# Patient Record
Sex: Female | Born: 1995 | Race: Black or African American | Hispanic: No | Marital: Single | Smoking: Current some day smoker
Health system: Southern US, Community
[De-identification: ages and names within clinical notes are randomized; demographics above are authoritative.]

## PROBLEM LIST (undated history)

## (undated) ENCOUNTER — Inpatient Hospital Stay (HOSPITAL_COMMUNITY): Payer: Self-pay

## (undated) DIAGNOSIS — J40 Bronchitis, not specified as acute or chronic: Secondary | ICD-10-CM

## (undated) DIAGNOSIS — R519 Headache, unspecified: Secondary | ICD-10-CM

## (undated) DIAGNOSIS — R51 Headache: Secondary | ICD-10-CM

## (undated) HISTORY — PX: NO PAST SURGERIES: SHX2092

---
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
## 2014-04-09 ENCOUNTER — Encounter (HOSPITAL_COMMUNITY): Payer: Self-pay | Admitting: *Deleted

## 2014-04-09 ENCOUNTER — Inpatient Hospital Stay (HOSPITAL_COMMUNITY)
Admission: AD | Admit: 2014-04-09 | Discharge: 2014-04-09 | Disposition: A | Discharge status: Home / Self Care | Payer: No Typology Code available for payment source | Source: Ambulatory Visit | Attending: Obstetrics & Gynecology | Admitting: Obstetrics & Gynecology

## 2014-04-09 DIAGNOSIS — Z975 Presence of (intrauterine) contraceptive device: Secondary | ICD-10-CM

## 2014-04-09 DIAGNOSIS — Z3046 Encounter for surveillance of implantable subdermal contraceptive: Secondary | ICD-10-CM | POA: Insufficient documentation

## 2014-04-09 NOTE — MAU Provider Note (Signed)
  History     CSN: [id number]  Arrival date and time: 04/09/14 1540   None     No chief complaint on file.  HPI  Lori Short is  a 18 y.o. who presents today to have her Nexplanon removed. She had the Nexplanon replaced 3 years ago, and was due for removal in May. [facility], and they told her they would not remove it since she was over 17 now. She did not know where to. She denies any pain. She had a period last week, unsure of the exact date. She states that she took a pregnancy test at home, and it was negative. She has been having unprotected intercourse.   No past medical history on file.  No past surgical history on file.  No family history on file.  History  Substance Use Topics  . Smoking status: Never Smoker   . Smokeless tobacco: Not on file  . Alcohol Use: No    Allergies: No Known Allergies  Prescriptions prior to admission  Medication Sig Dispense Refill  . etonogestrel (IMPLANON) 68 MG IMPL implant Inject 68 mg into the skin once.        ROS Physical Exam   There were no vitals taken for this visit.  Physical Exam  Nursing note and vitals reviewed. Constitutional: [name], place, and time. [name]. No distress.  Cardiovascular: Normal rate.   Respiratory: Effort normal.  GI: Soft. There is no tenderness.  Neurological: [name], place, and time.  Skin: Skin is warm and dry.  Psychiatric: She has a normal mood and affect.    MAU Course  Procedures    Assessment and Plan   1. Nexplanon in place    Discussed calling the health department or the [facility] for removal Patient is agreeable, and will call for an appointment Discussed condoms for STD protection and contraceptive back-up  Tawnya CrookHogan, Ronan Antolin Donovan 04/09/2014, 4:05 PM

## 2014-04-09 NOTE — Discharge Instructions (Signed)
Contraceptive Implant Information A contraceptive implant is a plastic rod that is inserted under your skin. It is usually inserted under the skin of your upper arm. It continually releases small amounts of progestin (synthetic progesterone) into your bloodstream. This prevents an egg from being released from your ovaries. It also thickens your cervical mucus to prevent sperm from entering the cervix, and it thins your uterine lining to prevent a fertilized egg from attaching to your uterus. Contraceptive implants can be effective for up to 3 years. They do not provide protection against sexually transmitted diseases (STDs).  The procedure to insert an implant usually takes about 10 minutes. There may be minor bruising, swelling, and discomfort at the insertion site for a couple days. The implant begins to work within the first day. Other contraceptive protection may be necessary for 7 days. Be sure to discuss with your health care provider if you need a backup method of contraception.  Your health care provider will make sure you are a good candidate for the contraceptive implant. Discuss with your health care provider the possible side effects of the implant. ADVANTAGES  It prevents pregnancy for up to 3 years.  It is easily reversible.  It is convenient.  It can be used when breastfeeding.  It can be used by women who cannot take estrogen. DISADVANTAGES  You may have irregular or unplanned vaginal bleeding.  You may develop side effects, including headache, weight gain, acne, breast tenderness, or mood changes.  You may have tissue or nerve damage after insertion (rare).  It may be difficult and uncomfortable to remove.  Certain medicines may interfere with the effectiveness of the implant. REMOVAL OF IMPLANT The implant should be removed in 3 years or as directed by your health care provider. The implant's effect wears off in a few hours after removal. Your ability to get pregnant  (fertility) may be restored in 1-2 weeks. A new implant can be inserted as soon as the old one is removed if desired. CONTRAINDICATIONS You should not get the implant if you are experiencing any of the following situations:  You are pregnant.  You have a history of breast cancer, osteoporosis, blood clots, heart disease, diabetes, high blood pressure, liver disease, tumors, or stroke.   You have undiagnosed vaginal bleeding.  You have a sensitivity to any part of the implant. Document Released: 08/30/2011 Document Revised: 05/13/2013 Document Reviewed: 03/09/2013 [facility] Patient Information 2015 [place], [place]. This information is not intended to replace advice given to you by your health care provider. Make sure you discuss any questions you have with your health care provider.  Contraception Choices Contraception (birth control) is the use of any methods or devices to prevent pregnancy. Below are some methods to help avoid pregnancy. HORMONAL METHODS   Contraceptive implant. This is a thin, plastic tube containing progesterone hormone. It does not contain estrogen hormone. Your health care provider inserts the tube in the inner part of the upper arm. The tube can remain in place for up to 3 years. After 3 years, the implant must be removed. The implant prevents the ovaries from releasing an egg (ovulation), thickens the cervical mucus to prevent sperm from entering the uterus, and thins the lining of the inside of the uterus.  Progesterone-only injections. These injections are given every 3 months by your health care provider to prevent pregnancy. This synthetic progesterone hormone stops the ovaries from releasing eggs. It also thickens cervical mucus and changes the uterine lining. This makes it  harder for sperm to survive in the uterus.  Birth control pills. These pills contain estrogen and progesterone hormone. They work by preventing the ovaries from releasing eggs (ovulation). They  also cause the cervical mucus to thicken, preventing the sperm from entering the uterus. Birth control pills are prescribed by a health care provider.Birth control pills can also be used to treat heavy periods.  Minipill. This type of birth control pill contains only the progesterone hormone. They are taken every day of each month and must be prescribed by your health care provider.  Birth control patch. The patch contains hormones similar to those in birth control pills. It must be changed once a week and is prescribed by a health care provider.  Vaginal ring. The ring contains hormones similar to those in birth control pills. It is left in the vagina for 3 weeks, removed for 1 week, and then a new one is put back in place. The patient must be comfortable inserting and removing the ring from the vagina.A health care provider's prescription is necessary.  Emergency contraception. Emergency contraceptives prevent pregnancy after unprotected sexual intercourse. This pill can be taken right after sex or up to 5 days after unprotected sex. It is most effective the sooner you take the pills after having sexual intercourse. Most emergency contraceptive pills are available without a prescription. Check with your pharmacist. Do not use emergency contraception as your only form of birth control. BARRIER METHODS   Female condom. This is a thin sheath (latex or rubber) that is worn over the penis during sexual intercourse. It can be used with spermicide to increase effectiveness.  Female condom. This is a soft, loose-fitting sheath that is put into the vagina before sexual intercourse.  Diaphragm. This is a soft, latex, dome-shaped barrier that must be fitted by a health care provider. It is inserted into the vagina, along with a spermicidal jelly. It is inserted before intercourse. The diaphragm should be left in the vagina for 6 to 8 hours after intercourse.  Cervical cap. This is a round, soft, latex or  plastic cup that fits over the cervix and must be fitted by a health care provider. The cap can be left in place for up to 48 hours after intercourse.  Sponge. This is a soft, circular piece of polyurethane foam. The sponge has spermicide in it. It is inserted into the vagina after wetting it and before sexual intercourse.  Spermicides. These are chemicals that kill or block sperm from entering the cervix and uterus. They come in the form of creams, jellies, suppositories, foam, or tablets. They do not require a prescription. They are inserted into the vagina with an applicator before having sexual intercourse. The process must be repeated every time you have sexual intercourse. INTRAUTERINE CONTRACEPTION  Intrauterine device (IUD). This is a T-shaped device that is put in a woman's uterus during a menstrual period to prevent pregnancy. There are 2 types:  Copper IUD. This type of IUD is wrapped in copper wire and is placed inside the uterus. Copper makes the uterus and fallopian tubes produce a fluid that kills sperm. It can stay in place for 10 years.  Hormone IUD. This type of IUD contains the hormone progestin (synthetic progesterone). The hormone thickens the cervical mucus and prevents sperm from entering the uterus, and it also thins the uterine lining to prevent implantation of a fertilized egg. The hormone can weaken or kill the sperm that get into the uterus. It can stay in  place for 3-5 years, depending on which type of IUD is used. PERMANENT METHODS OF CONTRACEPTION  Female tubal ligation. This is when the woman's fallopian tubes are surgically sealed, tied, or blocked to prevent the egg from traveling to the uterus.  Hysteroscopic sterilization. This involves placing a small coil or insert into each fallopian tube. Your doctor uses a technique called hysteroscopy to do the procedure. The device causes scar tissue to form. This results in permanent blockage of the fallopian tubes, so the  sperm cannot fertilize the egg. It takes about 3 months after the procedure for the tubes to become blocked. You must use another form of birth control for these 3 months.  Female sterilization. This is when the female has the tubes that carry sperm tied off (vasectomy).This blocks sperm from entering the vagina during sexual intercourse. After the procedure, the man can still ejaculate fluid (semen). NATURAL PLANNING METHODS  Natural family planning. This is not having sexual intercourse or using a barrier method (condom, diaphragm, cervical cap) on days the woman could become pregnant.  Calendar method. This is keeping track of the length of each menstrual cycle and identifying when you are fertile.  Ovulation method. This is avoiding sexual intercourse during ovulation.  Symptothermal method. This is avoiding sexual intercourse during ovulation, using a thermometer and ovulation symptoms.  Post-ovulation method. This is timing sexual intercourse after you have ovulated. Regardless of which type or method of contraception you choose, it is important that you use condoms to protect against the transmission of sexually transmitted infections (STIs). Talk with your health care provider about which form of contraception is most appropriate for you. Document Released: 09/10/2005 Document Revised: 09/15/2013 Document Reviewed: 03/05/2013 [facility] Patient Information 2015 [place], [place]. This information is not intended to replace advice given to you by your health care provider. Make sure you discuss any questions you have with your health care provider.

## 2014-04-09 NOTE — MAU Note (Addendum)
Wants nexplanon removed, was to have been removed in May. [facility] Also wants a preg test; has been having unprotected sex; neg home test x2

## 2014-04-12 NOTE — MAU Provider Note (Signed)
Attestation of Attending Supervision of Advanced Practitioner: Evaluation and management procedures were performed by the PA/NP/CNM/OB Fellow under my supervision/collaboration. Chart reviewed and agree with management and plan.  Jomarie Gellis V 04/12/2014 6:28 AM

## 2014-05-11 ENCOUNTER — Encounter (HOSPITAL_COMMUNITY): Payer: Self-pay | Admitting: Advanced Practice Midwife

## 2014-05-11 ENCOUNTER — Inpatient Hospital Stay (HOSPITAL_COMMUNITY)
Admission: AD | Admit: 2014-05-11 | Discharge: 2014-05-11 | Disposition: A | Discharge status: Home / Self Care | Payer: No Typology Code available for payment source | Source: Ambulatory Visit | Attending: Obstetrics & Gynecology | Admitting: Obstetrics & Gynecology

## 2014-05-11 DIAGNOSIS — N644 Mastodynia: Secondary | ICD-10-CM

## 2014-05-11 DIAGNOSIS — F17200 Nicotine dependence, unspecified, uncomplicated: Secondary | ICD-10-CM | POA: Insufficient documentation

## 2014-05-11 DIAGNOSIS — Z3202 Encounter for pregnancy test, result negative: Secondary | ICD-10-CM | POA: Insufficient documentation

## 2014-05-11 LAB — URINALYSIS, ROUTINE W REFLEX MICROSCOPIC
Bilirubin Urine: NEGATIVE
Glucose, UA: NEGATIVE mg/dL
Ketones, ur: NEGATIVE mg/dL
LEUKOCYTES UA: NEGATIVE
Nitrite: NEGATIVE
Protein, ur: NEGATIVE mg/dL
SPECIFIC GRAVITY, URINE: 1.015 (ref 1.005–1.030)
UROBILINOGEN UA: 0.2 mg/dL (ref 0.0–1.0)
pH: 5.5 (ref 5.0–8.0)

## 2014-05-11 LAB — URINE MICROSCOPIC-ADD ON

## 2014-05-11 LAB — HCG, QUANTITATIVE, PREGNANCY

## 2014-05-11 LAB — POCT PREGNANCY, URINE: PREG TEST UR: NEGATIVE

## 2014-05-11 NOTE — MAU Note (Signed)
Patient states she has been having fullness in the left breast with soreness. Denies bleeding or discharge, Has nausea, no vomiting for a couple of weeks. Has had headaches off and on. Has had a positive and a negative home pregnancy test. Has an implant that expired in May.

## 2014-05-11 NOTE — Discharge Instructions (Signed)

## 2014-05-11 NOTE — MAU Provider Note (Signed)
History     CSN: [id number]  Arrival date and time: 05/11/14 1727   None     Chief Complaint  Patient presents with  . Breast Pain   HPI 18 y.o. G0P0 with "breast fullness" bilaterally and intermittent pains in [name] x a few weeks. States she has had some nausea and headaches, no vomiting. Has an Implanon in place that expired in May, states she had a negative and a positive pregnancy test at home.   Past Medical History  Diagnosis Date  . Medical history non-contributory     Past Surgical History  Procedure Laterality Date  . No past surgeries      Family History  Problem Relation Age of Onset  . Hypertension Maternal Grandmother   . Heart disease Maternal Grandmother     History  Substance Use Topics  . Smoking status: Current Some Day Smoker    Types: Cigarettes  . Smokeless tobacco: Never Used  . Alcohol Use: Yes     Comment: occ    Allergies: No Known Allergies  Prescriptions prior to admission  Medication Sig Dispense Refill  . etonogestrel (IMPLANON) 68 MG IMPL implant Inject 68 mg into the skin once.        Review of Systems  Constitutional: Negative.   Respiratory: Negative.   Cardiovascular: Negative.   Gastrointestinal: Negative for nausea, vomiting, abdominal pain, diarrhea and constipation.  Genitourinary: Negative for dysuria, urgency, frequency, hematuria and flank pain.       Negative for vaginal bleeding, vaginal discharge, dyspareunia  Musculoskeletal: Negative.   Neurological: Negative.   Psychiatric/Behavioral: Negative.    Physical Exam   Blood pressure 134/67, pulse 81, temperature 98.9 F (37.2 C), temperature source Oral, resp. rate 16, height 5' 3.5" (1.613 m), weight 188 lb 3.2 oz (85.367 kg), last menstrual period 05/04/2014, SpO2 100.00%.  Physical Exam  Nursing note and vitals reviewed. Constitutional: [name], place, and time. [name]. No distress.   Cardiovascular: Normal rate.   Respiratory: Effort normal. Right breast exhibits no inverted nipple, no mass, no nipple discharge, no skin change and no tenderness. Left breast exhibits no inverted nipple, no mass, no nipple discharge, no skin change and no tenderness. Breasts are symmetrical.  GI: Soft.  Musculoskeletal: Normal range of motion.  Lymphadenopathy:    [name].  Neurological: [name], place, and time.  Skin: Skin is warm and dry.  Psychiatric: She has a normal mood and affect.    MAU Course  Procedures  Results for orders placed during the hospital encounter of 05/11/14 (from the past 24 hour(s))  URINALYSIS, ROUTINE W REFLEX MICROSCOPIC     Status: Abnormal   Collection Time    05/11/14  6:00 PM      Result Value Ref Range   Color, Urine YELLOW  YELLOW   APPearance CLEAR  CLEAR   Specific Gravity, Urine 1.015  1.005 - 1.030   pH 5.5  5.0 - 8.0   Glucose, UA NEGATIVE  NEGATIVE mg/dL   Hgb urine dipstick LARGE (*) NEGATIVE   Bilirubin Urine NEGATIVE  NEGATIVE   Ketones, ur NEGATIVE  NEGATIVE mg/dL   Protein, ur NEGATIVE  NEGATIVE mg/dL   Urobilinogen, UA 0.2  0.0 - 1.0 mg/dL   Nitrite NEGATIVE  NEGATIVE   Leukocytes, UA NEGATIVE  NEGATIVE  URINE MICROSCOPIC-ADD ON     Status: Abnormal   Collection Time  05/11/14  6:00 PM      Result Value Ref Range   Squamous Epithelial / LPF FEW (*) RARE   WBC, UA 0-2  <3 WBC/hpf   RBC / HPF 3-6  <3 RBC/hpf   Bacteria, UA FEW (*) RARE   Urine-Other MUCOUS PRESENT    POCT PREGNANCY, URINE     Status: None   Collection Time    05/11/14  6:32 PM      Result Value Ref Range   Preg Test, Ur NEGATIVE  NEGATIVE  HCG, QUANTITATIVE, PREGNANCY     Status: None   Collection Time    05/11/14  7:35 PM      Result Value Ref Range   hCG, Beta Chain, Quant, S <1  <5 mIU/mL     Assessment and Plan   1. Breast tenderness in female   2. Negative pregnancy test   Normal breast  exam today w/ no tenderness or masses. F/U w/ worsening or new breast sx.  F/U outpatient for Implanon removal and discussion of new birth control methods.     Medication List         etonogestrel 68 MG Impl implant  Commonly known as:  IMPLANON  Inject 68 mg into the skin once.            Follow-up Information   Follow up with [facility] [phone])    Contact information:   [address] [place] [address]       Follow up with [facility] [place].   Contact information:   [address] [place] [address] [phone]        Lori Short 05/11/2014, 8:43 PM

## 2014-05-11 NOTE — MAU Provider Note (Signed)
Attestation of Attending Supervision of Advanced Practitioner (PA/CNM/NP): Evaluation and management procedures were performed by the Advanced Practitioner under my supervision and collaboration.  I have reviewed the Advanced Practitioner's note and chart, and I agree with the management and plan.  Louise Victory, MD, FACOG Attending Obstetrician & Gynecologist Faculty Practice, [facility]   

## 2014-07-15 ENCOUNTER — Encounter: Payer: Self-pay | Admitting: Obstetrics & Gynecology

## 2014-07-15 ENCOUNTER — Ambulatory Visit (INDEPENDENT_AMBULATORY_CARE_PROVIDER_SITE_OTHER): Payer: Medicaid Other | Admitting: Obstetrics & Gynecology

## 2014-07-15 VITALS — BP 110/66 | HR 78 | Resp 16 | Ht 65.0 in | Wt 198.0 lb

## 2014-07-15 DIAGNOSIS — Z3042 Encounter for surveillance of injectable contraceptive: Secondary | ICD-10-CM

## 2014-07-15 DIAGNOSIS — Z3049 Encounter for surveillance of other contraceptives: Secondary | ICD-10-CM

## 2014-07-15 NOTE — Progress Notes (Signed)
   Subjective:    Patient ID: Lori ClockShaniqua L Short, female    DOB: 10/02/1995, 18 y.o.   MRN: [id number]  HPI  18 yo S AA G0 is here today to have her expired Nexplanon removed. She plans to join [employer] after she loses 20#. She says that she is currently abstinent.  Review of Systems     Objective:   Physical Exam  Consent was signed and time out was done. Her right arm was prepped with betadine after establishing the position of the Nexplanon. The area was infiltrated with 2 cc of 1% lidocaine. A small incision was made and the intact rod was easily removed. A steristrip was placed and her arm was noted to be hemostatic. It was bandaged.  She tolerated the procedure well.      Assessment & Plan:  She declines Gardasil, Flu vaccines or other forms on contraception

## 2014-09-30 ENCOUNTER — Inpatient Hospital Stay (HOSPITAL_COMMUNITY)
Admission: AD | Admit: 2014-09-30 | Discharge: 2014-09-30 | Disposition: A | Discharge status: Home / Self Care | Payer: Medicaid Other | Source: Ambulatory Visit | Attending: Obstetrics & Gynecology | Admitting: Obstetrics & Gynecology

## 2014-09-30 ENCOUNTER — Encounter (HOSPITAL_COMMUNITY): Payer: Self-pay | Admitting: *Deleted

## 2014-09-30 DIAGNOSIS — N760 Acute vaginitis: Secondary | ICD-10-CM

## 2014-09-30 DIAGNOSIS — O99210 Obesity complicating pregnancy, unspecified trimester: Secondary | ICD-10-CM | POA: Diagnosis present

## 2014-09-30 DIAGNOSIS — F17210 Nicotine dependence, cigarettes, uncomplicated: Secondary | ICD-10-CM | POA: Diagnosis not present

## 2014-09-30 DIAGNOSIS — B9689 Other specified bacterial agents as the cause of diseases classified elsewhere: Secondary | ICD-10-CM | POA: Insufficient documentation

## 2014-09-30 DIAGNOSIS — Z3202 Encounter for pregnancy test, result negative: Secondary | ICD-10-CM | POA: Diagnosis not present

## 2014-09-30 DIAGNOSIS — F17200 Nicotine dependence, unspecified, uncomplicated: Secondary | ICD-10-CM | POA: Diagnosis present

## 2014-09-30 DIAGNOSIS — A499 Bacterial infection, unspecified: Secondary | ICD-10-CM

## 2014-09-30 DIAGNOSIS — Z113 Encounter for screening for infections with a predominantly sexual mode of transmission: Secondary | ICD-10-CM | POA: Diagnosis present

## 2014-09-30 LAB — WET PREP, GENITAL
Trich, Wet Prep: NONE SEEN
Yeast Wet Prep HPF POC: NONE SEEN

## 2014-09-30 LAB — URINALYSIS, ROUTINE W REFLEX MICROSCOPIC
Bilirubin Urine: NEGATIVE
Glucose, UA: NEGATIVE mg/dL
Ketones, ur: NEGATIVE mg/dL
Leukocytes, UA: NEGATIVE
Nitrite: NEGATIVE
PH: 6 (ref 5.0–8.0)
Protein, ur: NEGATIVE mg/dL
UROBILINOGEN UA: 0.2 mg/dL (ref 0.0–1.0)

## 2014-09-30 LAB — URINE MICROSCOPIC-ADD ON

## 2014-09-30 LAB — POCT PREGNANCY, URINE: Preg Test, Ur: NEGATIVE

## 2014-09-30 LAB — RPR

## 2014-09-30 MED ORDER — METRONIDAZOLE 500 MG PO TABS: 500.0000 mg | ORAL_TABLET | Freq: Two times a day (BID) | ORAL | Status: DC

## 2014-09-30 NOTE — Discharge Instructions (Signed)
Bacterial Vaginosis Bacterial vaginosis is a vaginal infection that occurs when the normal balance of bacteria in the vagina is disrupted. It results from an overgrowth of certain bacteria. This is the most common vaginal infection in women of childbearing age. Treatment is important to prevent complications, especially in pregnant women, as it can cause a premature delivery. CAUSES  Bacterial vaginosis is caused by an increase in harmful bacteria that are normally present in smaller amounts in the vagina. Several different kinds of bacteria can cause bacterial vaginosis. However, the reason that the condition develops is not fully understood. RISK FACTORS Certain activities or behaviors can put you at an increased risk of developing bacterial vaginosis, including:  Having a new sex partner or multiple sex partners.  Douching.  Using an intrauterine device (IUD) for contraception. Women do not get bacterial vaginosis from toilet seats, bedding, swimming pools, or contact with objects around them. SIGNS AND SYMPTOMS  Some women with bacterial vaginosis have no signs or symptoms. Common symptoms include:  Grey vaginal discharge.  A fishlike odor with discharge, especially after sexual intercourse.  Itching or burning of the vagina and vulva.  Burning or pain with urination. DIAGNOSIS  Your health care provider will take a medical history and examine the vagina for signs of bacterial vaginosis. A sample of vaginal fluid may be taken. Your health care provider will look at this sample under a microscope to check for bacteria and abnormal cells. A vaginal pH test may also be done.  TREATMENT  Bacterial vaginosis may be treated with antibiotic medicines. These may be given in the form of a pill or a vaginal cream. A second round of antibiotics may be prescribed if the condition comes back after treatment.  HOME CARE INSTRUCTIONS   Only take over-the-counter or prescription medicines as  directed by your health care provider.  If antibiotic medicine was prescribed, take it as directed. Make sure you finish it even if you start to feel better.  Do not have sex until treatment is completed.  Tell all sexual partners that you have a vaginal infection. They should see their health care provider and be treated if they have problems, such as a mild rash or itching.  Practice safe sex by using condoms and only having one sex partner. SEEK MEDICAL CARE IF:   Your symptoms are not improving after 3 days of treatment.  You have increased discharge or pain.  You have a fever. MAKE SURE YOU:   Understand these instructions.  Will watch your condition.  Will get help right away if you are not doing well or get worse. FOR MORE INFORMATION  Centers for Disease Control and Prevention, Division of STD Prevention: www.cdc.gov/std American Sexual Health Association (ASHA): www.ashastd.org  Document Released: 09/10/2005 Document Revised: 07/01/2013 Document Reviewed: 04/22/2013 ExitCare Patient Information 2015 [employer]. This information is not intended to replace advice given to you by your health care provider. Make sure you discuss any questions you have with your health care provider.  

## 2014-09-30 NOTE — MAU Provider Note (Signed)
History     CSN: [id number]  Arrival date and time: 09/30/14 0113   None     No chief complaint on file.  HPI Comments: Lori Short 19 y.o. G0P0 presents to MAU for complete STD screening. She has a vaginal odor and new partner. She has no birth control.      Past Medical History  Diagnosis Date  . Medical history non-contributory     Past Surgical History  Procedure Laterality Date  . No past surgeries      Family History  Problem Relation Age of Onset  . Hypertension Maternal Grandmother   . Heart disease Maternal Grandmother     History  Substance Use Topics  . Smoking status: Current Some Day Smoker -- 0.25 packs/day for 1 years    Types: Cigarettes  . Smokeless tobacco: Never Used  . Alcohol Use: Yes     Comment: occ    Allergies:  Allergies  Allergen Reactions  . Shellfish Allergy Hives and Nausea And Vomiting    Prescriptions prior to admission  Medication Sig Dispense Refill Last Dose  . etonogestrel (IMPLANON) 68 MG IMPL implant Inject 68 mg into the skin once.   june 2012    Review of Systems  Constitutional: Negative.   Eyes: Negative.   Cardiovascular: Negative.   Genitourinary:       Vaginal odor  Skin: Negative.   Neurological: Negative.   Psychiatric/Behavioral: Negative.    Physical Exam   Blood pressure 125/67, pulse 68, temperature 98 F (36.7 C), temperature source Oral, resp. rate 18, height 5\' 4"  (1.626 m), weight 92.534 kg (204 lb), last menstrual period 09/24/2014, SpO2 99 %.  Physical Exam  Constitutional: [name], place, and time. [name]. No distress.  HENT:  Head: Normocephalic and atraumatic.  GI: Soft. She exhibits no distension. There is no tenderness. There is no rebound.  Genitourinary:  Genital:External negative Vaginal:small amount pink discharge Cervix:closed/ thick Bimanual: nontender   Musculoskeletal: Normal range of motion.  Neurological:  [name], place, and time.  Skin: Skin is warm and dry.  Psychiatric: She has a normal mood and affect. Her behavior is normal. Judgment and thought content normal.   Results for orders placed or performed during the hospital encounter of 09/30/14 (from the past 24 hour(s))  Urinalysis, Routine w reflex microscopic     Status: Abnormal   Collection Time: 09/30/14  1:37 AM  Result Value Ref Range   Color, Urine YELLOW YELLOW   APPearance CLEAR CLEAR   Specific Gravity, Urine >1.030 (H) 1.005 - 1.030   pH 6.0 5.0 - 8.0   Glucose, UA NEGATIVE NEGATIVE mg/dL   Hgb urine dipstick SMALL (A) NEGATIVE   Bilirubin Urine NEGATIVE NEGATIVE   Ketones, ur NEGATIVE NEGATIVE mg/dL   Protein, ur NEGATIVE NEGATIVE mg/dL   Urobilinogen, UA 0.2 0.0 - 1.0 mg/dL   Nitrite NEGATIVE NEGATIVE   Leukocytes, UA NEGATIVE NEGATIVE  Urine microscopic-add on     Status: Abnormal   Collection Time: 09/30/14  1:37 AM  Result Value Ref Range   Squamous Epithelial / LPF FEW (A) RARE   WBC, UA 0-2 <3 WBC/hpf   RBC / HPF 3-6 <3 RBC/hpf   Bacteria, UA FEW (A) RARE  Pregnancy, urine POC     Status: None   Collection Time: 09/30/14  1:48 AM  Result Value Ref Range   Preg Test, Ur NEGATIVE NEGATIVE  Wet  prep, genital     Status: Abnormal   Collection Time: 09/30/14  3:03 AM  Result Value Ref Range   Yeast Wet Prep HPF POC NONE SEEN NONE SEEN   Trich, Wet Prep NONE SEEN NONE SEEN   Clue Cells Wet Prep HPF POC FEW (A) NONE SEEN   WBC, Wet Prep HPF POC FEW (A) NONE SEEN   .  MAU Course  Procedures  MDM Wet prep, GC, Chlamydia, RPR, HIV  Assessment and Plan   A: Bacterial Vaginosis  P: Flagyl 500 mg po BID x7 days Await remainder of cultures Advised to find GYN Return to MAU for emergencies   Carolynn Serve 09/30/2014, 4:04 AM

## 2014-09-30 NOTE — MAU Note (Signed)
Pt reports she basically wants to be checked for any type of vaginal infection including STD's

## 2014-10-01 ENCOUNTER — Telehealth: Payer: Self-pay

## 2014-10-01 DIAGNOSIS — A749 Chlamydial infection, unspecified: Secondary | ICD-10-CM

## 2014-10-01 LAB — GC/CHLAMYDIA PROBE AMP
CT Probe RNA: POSITIVE — AB
GC Probe RNA: NEGATIVE

## 2014-10-01 LAB — HIV ANTIBODY (ROUTINE TESTING W REFLEX)
HIV 1/HIV 2 AB: NONREACTIVE
HIV 1/O/2 Abs-Index Value: <1 (ref <1.00)

## 2014-10-01 MED ORDER — AZITHROMYCIN 500 MG PO TABS: 1000.0000 mg | ORAL_TABLET | Freq: Every day | ORAL | Status: DC

## 2014-10-01 NOTE — Telephone Encounter (Signed)
-----   Message from Lori BanterMarni W Smith sent at 10/01/2014  9:30 AM EST ----- Telephone call to patient regarding positive chlamydia culture, patient notified.  Patient has not been treated and will need Rx called in per protocol [facility] [address].  Instructed patient to notify her partner for treatment.

## 2014-10-01 NOTE — Telephone Encounter (Signed)
Zithromax 1gm e-prescribed per protocol to patient's requested pharmacy.

## 2014-10-16 ENCOUNTER — Encounter (HOSPITAL_COMMUNITY): Payer: Self-pay | Admitting: *Deleted

## 2014-10-16 ENCOUNTER — Inpatient Hospital Stay (HOSPITAL_COMMUNITY)
Admission: AD | Admit: 2014-10-16 | Discharge: 2014-10-16 | Disposition: A | Discharge status: Home / Self Care | Payer: Medicaid Other | Source: Ambulatory Visit | Attending: Obstetrics and Gynecology | Admitting: Obstetrics and Gynecology

## 2014-10-16 DIAGNOSIS — B9689 Other specified bacterial agents as the cause of diseases classified elsewhere: Secondary | ICD-10-CM | POA: Insufficient documentation

## 2014-10-16 DIAGNOSIS — B3731 Acute candidiasis of vulva and vagina: Secondary | ICD-10-CM

## 2014-10-16 DIAGNOSIS — N760 Acute vaginitis: Secondary | ICD-10-CM | POA: Diagnosis not present

## 2014-10-16 DIAGNOSIS — A5602 Chlamydial vulvovaginitis: Secondary | ICD-10-CM | POA: Diagnosis not present

## 2014-10-16 DIAGNOSIS — Z3202 Encounter for pregnancy test, result negative: Secondary | ICD-10-CM | POA: Diagnosis not present

## 2014-10-16 DIAGNOSIS — B373 Candidiasis of vulva and vagina: Secondary | ICD-10-CM

## 2014-10-16 LAB — WET PREP, GENITAL
Trich, Wet Prep: NONE SEEN
Yeast Wet Prep HPF POC: NONE SEEN

## 2014-10-16 LAB — URINALYSIS, ROUTINE W REFLEX MICROSCOPIC
Bilirubin Urine: NEGATIVE
Glucose, UA: NEGATIVE mg/dL
Hgb urine dipstick: NEGATIVE
Ketones, ur: NEGATIVE mg/dL
Nitrite: NEGATIVE
PH: 6 (ref 5.0–8.0)
Protein, ur: NEGATIVE mg/dL
Specific Gravity, Urine: 1.025 (ref 1.005–1.030)
Urobilinogen, UA: 0.2 mg/dL (ref 0.0–1.0)

## 2014-10-16 LAB — URINE MICROSCOPIC-ADD ON

## 2014-10-16 LAB — POCT PREGNANCY, URINE: Preg Test, Ur: NEGATIVE

## 2014-10-16 MED ORDER — FLUCONAZOLE 150 MG PO TABS: 150.0000 mg | ORAL_TABLET | Freq: Every day | ORAL | Status: DC

## 2014-10-16 MED ORDER — NYSTATIN-TRIAMCINOLONE 100000-0.1 UNIT/GM-% EX OINT: 1.0000 "application " | TOPICAL_OINTMENT | Freq: Two times a day (BID) | CUTANEOUS | Status: DC

## 2014-10-16 NOTE — Discharge Instructions (Signed)

## 2014-10-16 NOTE — MAU Note (Signed)
Pt was seen in MAU two weeks ago and given Zithromax for positive chlamydia test and was also treated for BV.  She states that she is very "itchy down there and it burns sometimes."  Pt requests a pregnancy test as well.  Also states she has been having some spotting.

## 2014-10-16 NOTE — MAU Provider Note (Signed)
Chief Complaint: Possible Pregnancy and reaction to medication    First Provider Initiated Contact with Patient 10/16/14 1730     SUBJECTIVE HPI: [name] is a 19 y.o. G0P0 who presents to maternity admissions reporting vaginal itching and burning x 2-3 days.  She reports she took azithromycin 1 g a week ago to treat positive chlamydia and completed a 1 week course of Flagyl for BV around that time.  She denies abdominal pain, vaginal bleeding, urinary symptoms, h/a, dizziness, n/v, or fever/chills.     Past Medical History  Diagnosis Date  . Medical history non-contributory    Past Surgical History  Procedure Laterality Date  . No past surgeries     History   Social History  . Marital Status: Single    Spouse Name: N/A    Number of Children: N/A  . Years of Education: N/A   Occupational History  . unemployed    Social History Main Topics  . Smoking status: Current Some Day Smoker -- 0.25 packs/day for 1 years    Types: Cigarettes  . Smokeless tobacco: Never Used  . Alcohol Use: Yes     Comment: occ  . Drug Use: Yes    Special: Marijuana     Comment: occ  . Sexual Activity:    Partners: Male    Birth Control/ Protection: None     Comment: implant was to be removed in May   Other Topics Concern  . Not on file   Social History Narrative   No current facility-administered medications on file prior to encounter.   No current outpatient prescriptions on file prior to encounter.   Allergies  Allergen Reactions  . Shellfish Allergy Hives and Nausea And Vomiting    ROS: Pertinent items in HPI  OBJECTIVE Blood pressure 134/87, pulse 100, resp. rate 18, last menstrual period 09/24/2014, SpO2 100 %. GENERAL: Well-developed, well-nourished female in no acute distress.  HEENT: Normocephalic HEART: normal rate RESP: normal effort ABDOMEN: Soft, non-tender EXTREMITIES: Nontender, no edema NEURO: Alert and oriented Pelvic exam: Cervix pink, visually closed,  without lesion, moderate amount thick white discharge clinging to vaginal walls, vaginal walls and external genitalia with mild erythema Bimanual exam: Cervix 0/long/high, firm, anterior, neg CMT, uterus nontender, nonenlarged, adnexa without tenderness, enlargement, or mass  LAB RESULTS  Results for orders placed or performed during the hospital encounter of 10/16/14 (from the past 168 hour(s))  Urine culture   Collection Time: 10/16/14  4:35 PM  Result Value Ref Range   Specimen Description URINE, CLEAN CATCH    Special Requests NONE    Colony Count      30,000 COLONIES/ML Performed at [facility]    Culture      Multiple bacterial morphotypes present, none predominant. Suggest appropriate recollection if clinically indicated. Performed at [facility]    Report Status 10/18/2014 FINAL   Urinalysis, Routine w reflex microscopic   Collection Time: 10/16/14  4:39 PM  Result Value Ref Range   Color, Urine YELLOW YELLOW   APPearance CLEAR CLEAR   Specific Gravity, Urine 1.025 1.005 - 1.030   pH 6.0 5.0 - 8.0   Glucose, UA NEGATIVE NEGATIVE mg/dL   Hgb urine dipstick NEGATIVE NEGATIVE   Bilirubin Urine NEGATIVE NEGATIVE   Ketones, ur NEGATIVE NEGATIVE mg/dL   Protein, ur NEGATIVE NEGATIVE mg/dL   Urobilinogen, UA 0.2 0.0 - 1.0 mg/dL   Nitrite NEGATIVE NEGATIVE   Leukocytes, UA SMALL (A) NEGATIVE  Urine microscopic-add on  Collection Time: 10/16/14  4:39 PM  Result Value Ref Range   Squamous Epithelial / LPF MANY (A) RARE   WBC, UA 3-6 <3 WBC/hpf   RBC / HPF 0-2 <3 RBC/hpf   Bacteria, UA RARE RARE   Urine-Other MUCOUS PRESENT   Wet prep, genital   Collection Time: 10/16/14  5:30 PM  Result Value Ref Range   Yeast Wet Prep HPF POC NONE SEEN NONE SEEN   Trich, Wet Prep NONE SEEN NONE SEEN   Clue Cells Wet Prep HPF POC FEW (A) NONE SEEN   WBC, Wet Prep HPF POC MODERATE (A) NONE SEEN  Pregnancy, urine POC   Collection Time: 10/16/14  5:34 PM  Result Value  Ref Range   Preg Test, Ur NEGATIVE NEGATIVE    IMAGING No results found.  ASSESSMENT 1. Vagina, candidiasis     PLAN Discharge home Diflucan 150 mg x 1 dose Mycolog ointment PRN GCC and urine culture pending   Follow-up Information    Follow up with [facility].   Specialty:  Obstetrics and Gynecology   Why:  Or with preferred Gyn provider for routine care/contraception   Contact information:   [address] [place] [address] [phone]      Follow up with [facility] [place] MATERNITY ADMISSIONS.   Why:  As needed for emergencies   Contact information:   [address] [id number] mc [place] [address] [phone]      Sharen Counter Certified Nurse-Midwife 10/18/2014  1:14 PM

## 2014-10-18 LAB — URINE CULTURE

## 2014-10-18 LAB — GC/CHLAMYDIA PROBE AMP (~~LOC~~) NOT AT ARMC
Chlamydia: NEGATIVE
NEISSERIA GONORRHEA: NEGATIVE

## 2015-04-07 ENCOUNTER — Emergency Department (HOSPITAL_COMMUNITY): Payer: Medicaid Other

## 2015-04-07 ENCOUNTER — Emergency Department (HOSPITAL_COMMUNITY)
Admission: EM | Admit: 2015-04-07 | Discharge: 2015-04-07 | Disposition: A | Discharge status: Home / Self Care | Payer: Medicaid Other | Attending: Emergency Medicine | Admitting: Emergency Medicine

## 2015-04-07 ENCOUNTER — Encounter (HOSPITAL_COMMUNITY): Payer: Self-pay | Admitting: *Deleted

## 2015-04-07 DIAGNOSIS — R0602 Shortness of breath: Secondary | ICD-10-CM

## 2015-04-07 DIAGNOSIS — Z79899 Other long term (current) drug therapy: Secondary | ICD-10-CM | POA: Insufficient documentation

## 2015-04-07 DIAGNOSIS — R110 Nausea: Secondary | ICD-10-CM | POA: Insufficient documentation

## 2015-04-07 DIAGNOSIS — J209 Acute bronchitis, unspecified: Secondary | ICD-10-CM | POA: Insufficient documentation

## 2015-04-07 DIAGNOSIS — Z720 Tobacco use: Secondary | ICD-10-CM | POA: Insufficient documentation

## 2015-04-07 DIAGNOSIS — J40 Bronchitis, not specified as acute or chronic: Secondary | ICD-10-CM

## 2015-04-07 LAB — D-DIMER, QUANTITATIVE (NOT AT ARMC): D-Dimer, Quant: <0.27 ug/mL-FEU (ref 0.00–0.48)

## 2015-04-07 LAB — CBC
HCT: 38.5 % (ref 36.0–46.0)
Hemoglobin: 11.6 g/dL — ABNORMAL LOW (ref 12.0–15.0)
MCH: 24.2 pg — ABNORMAL LOW (ref 26.0–34.0)
MCHC: 30.1 g/dL (ref 30.0–36.0)
MCV: 80.2 fL (ref 78.0–100.0)
Platelets: 424 10*3/uL — ABNORMAL HIGH (ref 150–400)
RBC: 4.8 MIL/uL (ref 3.87–5.11)
RDW: 14.8 % (ref 11.5–15.5)
WBC: 8.6 10*3/uL (ref 4.0–10.5)

## 2015-04-07 LAB — BASIC METABOLIC PANEL
ANION GAP: 7 (ref 5–15)
BUN: 10 mg/dL (ref 6–20)
CALCIUM: 8.9 mg/dL (ref 8.9–10.3)
CO2: 26 mmol/L (ref 22–32)
Chloride: 106 mmol/L (ref 101–111)
Creatinine, Ser: 0.81 mg/dL (ref 0.44–1.00)
GFR calc Af Amer: >60 mL/min (ref >60)
GFR calc non Af Amer: >60 mL/min (ref >60)
Glucose, Bld: 87 mg/dL (ref 65–99)
POTASSIUM: 3.9 mmol/L (ref 3.5–5.1)
Sodium: 139 mmol/L (ref 135–145)

## 2015-04-07 LAB — I-STAT TROPONIN, ED: TROPONIN I, POC: 0 ng/mL (ref 0.00–0.08)

## 2015-04-07 MED ORDER — ALBUTEROL SULFATE HFA 108 (90 BASE) MCG/ACT IN AERS
2.0000 | INHALATION_SPRAY | RESPIRATORY_TRACT | Status: DC | PRN
  Administered 2015-04-07: 2 via RESPIRATORY_TRACT
  Filled 2015-04-07: qty 6.7

## 2015-04-07 MED ORDER — AEROCHAMBER PLUS FLO-VU MEDIUM MISC
1.0000 | Freq: Once | Status: AC
  Administered 2015-04-07: 1
  Filled 2015-04-07: qty 1

## 2015-04-07 MED ORDER — PREDNISONE 50 MG PO TABS: 50.0000 mg | ORAL_TABLET | Freq: Every day | ORAL | Status: DC

## 2015-04-07 MED ORDER — GUAIFENESIN ER 1200 MG PO TB12: 1.0000 | ORAL_TABLET | Freq: Two times a day (BID) | ORAL | Status: DC

## 2015-04-07 NOTE — Progress Notes (Signed)
CM spoke with pt who [place].  CM discussed and provided written information for uninsured accepting pcps, discussed the importance of pcp vs EDP services for f/u care, www.needymeds.org, www.goodrx.com, discounted pharmacies and other [place] resources such as [facility] , [facility], affordable care act, financial assistance, uninsured dental services, [place] med assist, DSS and  health department  Reviewed resources for [place] uninsured accepting pcps like [facility], family medicine [facility] [address], [facility], [facility], local urgent care centers, [facility], [facility] family [facility], general medical clinics, family services of the [place], [facility] urgent care plus others, medication resources, [facility] out patient pharmacies and housing Pt voiced understanding and appreciation of resources provided   Provided [facility] contact information Pt agreed to a referral Cm completed referral Pt states she has not had an orange card previously  Pt to be contact by [facility] clinical liason

## 2015-04-07 NOTE — ED Notes (Signed)
Pt reports chest pain, SOB, and nausea since last night. Pain 4/10. Able to speak in full sentences. Denies cough.

## 2015-04-07 NOTE — Discharge Instructions (Signed)
Return here as needed.  Follow up with a primary care doctor °

## 2015-04-11 NOTE — ED Provider Notes (Signed)
CSN: [id number]     Arrival date & time 04/07/15  1332 History   First MD Initiated Contact with Patient 04/07/15 1512     Chief Complaint  Patient presents with  . Shortness of Breath  . Nausea     (Consider location/radiation/quality/duration/timing/severity/associated sxs/prior Treatment) HPI Patient presents to the emergency department with shortness of breath that started last night with some mild nausea.  The patient states that she has had some mild cough associated with this did not not significant coughing.  She states that she did have some nausea as well.  The patient states that she did not have any headache, blurred vision, fever, weakness, dizziness, headache, blurred vision, back pain, neck pain, or throat, runny nose or syncope.  Patient states she did not take any medications prior to arrival Past Medical History  Diagnosis Date  . Medical history non-contributory    Past Surgical History  Procedure Laterality Date  . No past surgeries     Family History  Problem Relation Age of Onset  . Hypertension Maternal Grandmother   . Heart disease Maternal Grandmother    History  Substance Use Topics  . Smoking status: Current Some Day Smoker -- 0.25 packs/day for 1 years    Types: Cigarettes  . Smokeless tobacco: Never Used  . Alcohol Use: Yes     Comment: occ   OB History    Gravida Para Term Preterm AB TAB SAB Ectopic Multiple Living   0              Review of Systems All other systems negative except as documented in the HPI. All pertinent positives and negatives as reviewed in the HPI.   Allergies  Shellfish allergy  Home Medications   Prior to Admission medications   Medication Sig Start Date End Date Taking? Authorizing Provider  fluconazole (DIFLUCAN) 150 MG tablet Take 1 tablet (150 mg total) by mouth daily. Patient not taking: Reported on 04/07/2015 10/16/14   Wilmer Floor Leftwich-Kirby, CNM  Guaifenesin 1200 MG TB12 Take 1 tablet (1,200 mg total) by  mouth 2 (two) times daily. 04/07/15   Charlestine Night, PA-C  nystatin-triamcinolone ointment (MYCOLOG) Apply 1 application topically 2 (two) times daily. Patient not taking: Reported on 04/07/2015 10/16/14   Wilmer Floor Leftwich-Kirby, CNM  predniSONE (DELTASONE) 50 MG tablet Take 1 tablet (50 mg total) by mouth daily. 04/07/15   Elinora Weigand, PA-C   BP 131/73 mmHg  Pulse 80  Temp(Src) 99.1 F (37.3 C) (Oral)  Resp 18  SpO2 100%  LMP 03/28/2015 (Exact Date) Physical Exam  Constitutional: [name], place, and time. [name].  HENT:  Head: Normocephalic and atraumatic.  Mouth/Throat: Oropharynx is clear and moist.  Eyes: Pupils are equal, round, and reactive to light.  Neck: Normal range of motion. Neck supple.  Cardiovascular: Normal rate, regular rhythm and normal heart sounds.  Exam reveals no gallop and no friction rub.   No murmur heard. Pulmonary/Chest: Effort normal and breath sounds normal. No respiratory distress. She has no wheezes.  Abdominal: Soft. She exhibits no distension. There is no tenderness.  Musculoskeletal: She exhibits no edema.  Neurological: [name], place, and time. She exhibits normal muscle tone. Coordination normal.  Skin: Skin is warm and dry. No rash noted. No erythema.  Psychiatric: She has a normal mood and affect. Her behavior is normal.  Nursing note and vitals reviewed.   ED Course  Procedures (  including critical care time) Labs Review Labs Reviewed  CBC - Abnormal; Notable for the following:    Hemoglobin 11.6 (*)    MCH 24.2 (*)    Platelets 424 (*)    All other components within normal limits  BASIC METABOLIC PANEL  D-DIMER, QUANTITATIVE (NOT AT [facility])  I-STAT TROPOININ, ED    Imaging Review No results found.   EKG Interpretation   Date/Time:  Thursday April 07 2015 13:38:59 EDT Ventricular Rate:  93 PR Interval:  183 QRS Duration: 75 QT Interval:   324 QTC Calculation: 403 R Axis:   56 Text Interpretation:  Sinus rhythm Baseline wander in lead(s) V5 V6 ED  PHYSICIAN INTERPRETATION AVAILABLE IN [facility] Confirmed by TEST,  Record ([id number]) on 04/08/2015 7:28:11 AM      Patient has no abnormalities on physical exam and her is sick.  She was normal.  The patient has normal vital signs show be treated for possible mild bronchitis.  Told to return here as needed    Charlestine NightChristopher Davis Vannatter, PA-C 04/11/15 0158  Charlestine Nighthristopher Sanjith Siwek, PA-C 04/11/15 0159  Eber HongBrian Miller, MD 04/12/15 [phone]

## 2015-04-28 ENCOUNTER — Inpatient Hospital Stay (HOSPITAL_COMMUNITY)
Admission: AD | Admit: 2015-04-28 | Discharge: 2015-04-28 | Disposition: A | Discharge status: Home / Self Care | Payer: Medicaid Other | Source: Ambulatory Visit | Attending: Family Medicine | Admitting: Family Medicine

## 2015-04-28 ENCOUNTER — Encounter (HOSPITAL_COMMUNITY): Payer: Self-pay | Admitting: *Deleted

## 2015-04-28 DIAGNOSIS — N760 Acute vaginitis: Secondary | ICD-10-CM | POA: Insufficient documentation

## 2015-04-28 DIAGNOSIS — Z202 Contact with and (suspected) exposure to infections with a predominantly sexual mode of transmission: Secondary | ICD-10-CM | POA: Insufficient documentation

## 2015-04-28 DIAGNOSIS — B9689 Other specified bacterial agents as the cause of diseases classified elsewhere: Secondary | ICD-10-CM | POA: Diagnosis not present

## 2015-04-28 DIAGNOSIS — Z87891 Personal history of nicotine dependence: Secondary | ICD-10-CM | POA: Insufficient documentation

## 2015-04-28 DIAGNOSIS — Z3200 Encounter for pregnancy test, result unknown: Secondary | ICD-10-CM | POA: Diagnosis present

## 2015-04-28 DIAGNOSIS — Z3202 Encounter for pregnancy test, result negative: Secondary | ICD-10-CM | POA: Diagnosis not present

## 2015-04-28 DIAGNOSIS — A499 Bacterial infection, unspecified: Secondary | ICD-10-CM

## 2015-04-28 HISTORY — DX: Headache: R51

## 2015-04-28 HISTORY — DX: Headache, unspecified: R51.9

## 2015-04-28 HISTORY — DX: Bronchitis, not specified as acute or chronic: J40

## 2015-04-28 LAB — URINALYSIS, ROUTINE W REFLEX MICROSCOPIC
Bilirubin Urine: NEGATIVE
GLUCOSE, UA: NEGATIVE mg/dL
Hgb urine dipstick: NEGATIVE
Ketones, ur: NEGATIVE mg/dL
Leukocytes, UA: NEGATIVE
Nitrite: NEGATIVE
PH: 6.5 (ref 5.0–8.0)
PROTEIN: NEGATIVE mg/dL
Specific Gravity, Urine: 1.02 (ref 1.005–1.030)
UROBILINOGEN UA: 0.2 mg/dL (ref 0.0–1.0)

## 2015-04-28 LAB — WET PREP, GENITAL
Trich, Wet Prep: NONE SEEN
YEAST WET PREP: NONE SEEN

## 2015-04-28 LAB — POCT PREGNANCY, URINE: PREG TEST UR: NEGATIVE

## 2015-04-28 MED ORDER — CEFTRIAXONE SODIUM 250 MG IJ SOLR
250.0000 mg | Freq: Once | INTRAMUSCULAR | Status: AC
  Administered 2015-04-28: 250 mg via INTRAMUSCULAR
  Filled 2015-04-28: qty 250

## 2015-04-28 MED ORDER — METRONIDAZOLE 500 MG PO TABS: 500.0000 mg | ORAL_TABLET | Freq: Two times a day (BID) | ORAL | Status: DC

## 2015-04-28 MED ORDER — AZITHROMYCIN 250 MG PO TABS
1000.0000 mg | ORAL_TABLET | Freq: Once | ORAL | Status: AC
  Administered 2015-04-28: 1000 mg via ORAL
  Filled 2015-04-28: qty 4

## 2015-04-28 NOTE — MAU Provider Note (Signed)
History     CSN: [id number]  Arrival date and time: 04/28/15 2104   First Provider Initiated Contact with Patient 04/28/15 2309      No chief complaint on file.  HPI Comments: Lori Short is a 19 y.o. G0P0 who presents today for a pregnancy test and STD. She states that she has had a vaginal discharge with an odor. She also reports unprotected intercourse with a partner who may have had chlamydia.   Vaginal Discharge The patient's primary symptoms include a genital odor and vaginal discharge. This is a new problem. The current episode started in the past 7 days. The problem occurs constantly. The problem has been unchanged. The patient is experiencing no pain. She is not pregnant. Pertinent negatives include no abdominal pain, constipation, diarrhea, dysuria, fever, frequency, nausea, urgency or vomiting. The vaginal discharge was malodorous and white. There has been no bleeding. Nothing aggravates the symptoms. She has tried nothing for the symptoms. The treatment provided no relief. She is sexually active. It is possible that her partner has an STD. She uses nothing for contraception.      Past Medical History  Diagnosis Date  . Bronchitis   . Headache     Past Surgical History  Procedure Laterality Date  . No past surgeries      Family History  Problem Relation Age of Onset  . Hypertension Maternal Grandmother   . Heart disease Maternal Grandmother     History  Substance Use Topics  . Smoking status: Former Smoker -- 0.25 packs/day for 1 years    Types: Cigarettes    Quit date: 04/14/2015  . Smokeless tobacco: Never Used  . Alcohol Use: Yes     Comment: occ    Allergies:  Allergies  Allergen Reactions  . Shellfish Allergy Hives and Nausea And Vomiting    Prescriptions prior to admission  Medication Sig Dispense Refill Last Dose  . fluconazole (DIFLUCAN) 150 MG tablet Take 1 tablet (150 mg total) by mouth daily. (Patient not taking: Reported on 04/07/2015) 1  tablet 0 Completed Course at Unknown time  . Guaifenesin 1200 MG TB12 Take 1 tablet (1,200 mg total) by mouth 2 (two) times daily. (Patient not taking: Reported on 04/28/2015) 20 each 0 Completed Course at Unknown time  . nystatin-triamcinolone ointment (MYCOLOG) Apply 1 application topically 2 (two) times daily. (Patient not taking: Reported on 04/07/2015) 30 g 0 Completed Course at Unknown time  . predniSONE (DELTASONE) 50 MG tablet Take 1 tablet (50 mg total) by mouth daily. (Patient not taking: Reported on 04/28/2015) 5 tablet 0 Not Taking at Unknown time    Review of Systems  Constitutional: Negative for fever.  Gastrointestinal: Negative for nausea, vomiting, abdominal pain, diarrhea and constipation.  Genitourinary: Positive for vaginal discharge. Negative for dysuria, urgency and frequency.   Physical Exam   Blood pressure 124/76, pulse 80, temperature 98.1 F (36.7 C), temperature source Oral, resp. rate 18, height 5\' 5"  (1.651 m), weight 94.348 kg (208 lb), last menstrual period 04/21/2015, SpO2 100 %.  Physical Exam  Nursing note and vitals reviewed. Constitutional: [name], place, and time. [name]. No distress.  Cardiovascular: Normal rate.   Respiratory: Effort normal.  GI: Soft. There is no tenderness. There is no rebound.  Neurological: [name], place, and time.  Skin: Skin is warm and dry.  Psychiatric: She has a normal mood and affect.     Results for  orders placed or performed during the hospital encounter of 04/28/15 (from the past 24 hour(s))  Urinalysis, Routine w reflex microscopic (not at [facility])     Status: None   Collection Time: 04/28/15  9:20 PM  Result Value Ref Range   Color, Urine YELLOW YELLOW   APPearance CLEAR CLEAR   Specific Gravity, Urine 1.020 1.005 - 1.030   pH 6.5 5.0 - 8.0   Glucose, UA NEGATIVE NEGATIVE mg/dL   Hgb urine dipstick NEGATIVE NEGATIVE   Bilirubin  Urine NEGATIVE NEGATIVE   Ketones, ur NEGATIVE NEGATIVE mg/dL   Protein, ur NEGATIVE NEGATIVE mg/dL   Urobilinogen, UA 0.2 0.0 - 1.0 mg/dL   Nitrite NEGATIVE NEGATIVE   Leukocytes, UA NEGATIVE NEGATIVE  Pregnancy, urine POC     Status: None   Collection Time: 04/28/15  9:29 PM  Result Value Ref Range   Preg Test, Ur NEGATIVE NEGATIVE  Wet prep, genital     Status: Abnormal   Collection Time: 04/28/15 10:35 PM  Result Value Ref Range   Yeast Wet Prep HPF POC NONE SEEN NONE SEEN   Trich, Wet Prep NONE SEEN NONE SEEN   Clue Cells Wet Prep HPF POC FEW (A) NONE SEEN   WBC, Wet Prep HPF POC FEW (A) NONE SEEN    MAU Course  Procedures  MDM Patient treated in MAU with rocephin and azithromycin.   Assessment and Plan   1. Bacterial vaginal infection   2. Exposure to STD    DC home Comfort measures reviewed  HIV pending  GC/CT pending, patient treated today  RX: flagyl #14  Return to MAU as needed   Follow-up Information    Please follow up.   Why:  Call in 2 days or log in to your mychart account for results       Follow up with [facility] DEPT [place].   Why:  As needed   Contact information:   [address] [facility] [address] [phone]        Tawnya Crook 04/28/2015, 11:19 PM

## 2015-04-28 NOTE — Discharge Instructions (Signed)
Levonorgestrel intrauterine device (IUD) What is this medicine? LEVONORGESTREL IUD (LEE voe nor jes trel) is a contraceptive (birth control) device. The device is placed inside the uterus by a healthcare professional. It is used to prevent pregnancy and can also be used to treat heavy bleeding that occurs during your period. Depending on the device, it can be used for 3 to 5 years. This medicine may be used for other purposes; ask your health care provider or pharmacist if you have questions. COMMON BRAND NAME(S): LILETTA, Mirena, Skyla What should I tell my health care provider before I take this medicine? They need to know if you have any of these conditions: -abnormal Pap smear -cancer of the breast, uterus, or cervix -diabetes -endometritis -genital or pelvic infection now or in the past -have more than one sexual partner or your partner has more than one partner -heart disease -history of an ectopic or tubal pregnancy -immune system problems -IUD in place -liver disease or tumor -problems with blood clots or take blood-thinners -use intravenous drugs -uterus of unusual shape -vaginal bleeding that has not been explained -an unusual or allergic reaction to levonorgestrel, other hormones, silicone, or polyethylene, medicines, foods, dyes, or preservatives -pregnant or trying to get pregnant -breast-feeding How should I use this medicine? This device is placed inside the uterus by a health care professional. Talk to your pediatrician regarding the use of this medicine in children. Special care may be needed. Overdosage: If you think you have taken too much of this medicine contact a poison control center or emergency room at once. NOTE: This medicine is only for you. Do not share this medicine with others. What if I miss a dose? This does not apply. What may interact with this medicine? Do not take this medicine with any of the following  medications: -amprenavir -bosentan -fosamprenavir This medicine may also interact with the following medications: -aprepitant -barbiturate medicines for inducing sleep or treating seizures -bexarotene -griseofulvin -medicines to treat seizures like carbamazepine, ethotoin, felbamate, oxcarbazepine, phenytoin, topiramate -modafinil -pioglitazone -rifabutin -rifampin -rifapentine -some medicines to treat HIV infection like atazanavir, indinavir, lopinavir, nelfinavir, tipranavir, ritonavir -St. John's wort -warfarin This list may not describe all possible interactions. Give your health care provider a list of all the medicines, herbs, non-prescription drugs, or dietary supplements you use. Also tell them if you smoke, drink alcohol, or use illegal drugs. Some items may interact with your medicine. What should I watch for while using this medicine? Visit your doctor or health care professional for regular check ups. See your doctor if you or your partner has sexual contact with others, becomes HIV positive, or gets a sexual transmitted disease. This product does not protect you against HIV infection (AIDS) or other sexually transmitted diseases. You can check the placement of the IUD yourself by reaching up to the top of your vagina with clean fingers to feel the threads. Do not pull on the threads. It is a good habit to check placement after each menstrual period. Call your doctor right away if you feel more of the IUD than just the threads or if you cannot feel the threads at all. The IUD may come out by itself. You may become pregnant if the device comes out. If you notice that the IUD has come out use a backup birth control method like condoms and call your health care provider. Using tampons will not change the position of the IUD and are okay to use during your period. What side effects may   I notice from receiving this medicine? Side effects that you should report to your doctor or  health care professional as soon as possible: -allergic reactions like skin rash, itching or hives, swelling of the face, lips, or tongue -fever, flu-like symptoms -genital sores -high blood pressure -no menstrual period for 6 weeks during use -pain, swelling, warmth in the leg -pelvic pain or tenderness -severe or sudden headache -signs of pregnancy -stomach cramping -sudden shortness of breath -trouble with balance, talking, or walking -unusual vaginal bleeding, discharge -yellowing of the eyes or skin Side effects that usually do not require medical attention (report to your doctor or health care professional if they continue or are bothersome): -acne -breast pain -change in sex drive or performance -changes in weight -cramping, dizziness, or faintness while the device is being inserted -headache -irregular menstrual bleeding within first 3 to 6 months of use -nausea This list may not describe all possible side effects. Call your doctor for medical advice about side effects. You may report side effects to FDA at 1-800-FDA-1088. Where should I keep my medicine? This does not apply. NOTE: This sheet is a summary. It may not cover all possible information. If you have questions about this medicine, talk to your doctor, pharmacist, or health care provider.  2015, Elsevier/Gold Standard. (2011-10-11 13:54:04) Contraception Choices Contraception (birth control) is the use of any methods or devices to prevent pregnancy. Below are some methods to help avoid pregnancy. HORMONAL METHODS   Contraceptive implant. This is a thin, plastic tube containing progesterone hormone. It does not contain estrogen hormone. Your health care provider inserts the tube in the inner part of the upper arm. The tube can remain in place for up to 3 years. After 3 years, the implant must be removed. The implant prevents the ovaries from releasing an egg (ovulation), thickens the cervical mucus to prevent sperm  from entering the uterus, and thins the lining of the inside of the uterus.  Progesterone-only injections. These injections are given every 3 months by your health care provider to prevent pregnancy. This synthetic progesterone hormone stops the ovaries from releasing eggs. It also thickens cervical mucus and changes the uterine lining. This makes it harder for sperm to survive in the uterus.  Birth control pills. These pills contain estrogen and progesterone hormone. They work by preventing the ovaries from releasing eggs (ovulation). They also cause the cervical mucus to thicken, preventing the sperm from entering the uterus. Birth control pills are prescribed by a health care provider.Birth control pills can also be used to treat heavy periods.  Minipill. This type of birth control pill contains only the progesterone hormone. They are taken every day of each month and must be prescribed by your health care provider.  Birth control patch. The patch contains hormones similar to those in birth control pills. It must be changed once a week and is prescribed by a health care provider.  Vaginal ring. The ring contains hormones similar to those in birth control pills. It is left in the vagina for 3 weeks, removed for 1 week, and then a new one is put back in place. The patient must be comfortable inserting and removing the ring from the vagina.A health care provider's prescription is necessary.  Emergency contraception. Emergency contraceptives prevent pregnancy after unprotected sexual intercourse. This pill can be taken right after sex or up to 5 days after unprotected sex. It is most effective the sooner you take the pills after having sexual intercourse. Most emergency contraceptive   pills are available without a prescription. Check with your pharmacist. Do not use emergency contraception as your only form of birth control. BARRIER METHODS   Female condom. This is a thin sheath (latex or rubber) that  is worn over the penis during sexual intercourse. It can be used with spermicide to increase effectiveness.  Female condom. This is a soft, loose-fitting sheath that is put into the vagina before sexual intercourse.  Diaphragm. This is a soft, latex, dome-shaped barrier that must be fitted by a health care provider. It is inserted into the vagina, along with a spermicidal jelly. It is inserted before intercourse. The diaphragm should be left in the vagina for 6 to 8 hours after intercourse.  Cervical cap. This is a round, soft, latex or plastic cup that fits over the cervix and must be fitted by a health care provider. The cap can be left in place for up to 48 hours after intercourse.  Sponge. This is a soft, circular piece of polyurethane foam. The sponge has spermicide in it. It is inserted into the vagina after wetting it and before sexual intercourse.  Spermicides. These are chemicals that kill or block sperm from entering the cervix and uterus. They come in the form of creams, jellies, suppositories, foam, or tablets. They do not require a prescription. They are inserted into the vagina with an applicator before having sexual intercourse. The process must be repeated every time you have sexual intercourse. INTRAUTERINE CONTRACEPTION  Intrauterine device (IUD). This is a T-shaped device that is put in a woman's uterus during a menstrual period to prevent pregnancy. There are 2 types:  Copper IUD. This type of IUD is wrapped in copper wire and is placed inside the uterus. Copper makes the uterus and fallopian tubes produce a fluid that kills sperm. It can stay in place for 10 years.  Hormone IUD. This type of IUD contains the hormone progestin (synthetic progesterone). The hormone thickens the cervical mucus and prevents sperm from entering the uterus, and it also thins the uterine lining to prevent implantation of a fertilized egg. The hormone can weaken or kill the sperm that get into the  uterus. It can stay in place for 3-5 years, depending on which type of IUD is used. PERMANENT METHODS OF CONTRACEPTION  Female tubal ligation. This is when the woman's fallopian tubes are surgically sealed, tied, or blocked to prevent the egg from traveling to the uterus.  Hysteroscopic sterilization. This involves placing a small coil or insert into each fallopian tube. Your doctor uses a technique called hysteroscopy to do the procedure. The device causes scar tissue to form. This results in permanent blockage of the fallopian tubes, so the sperm cannot fertilize the egg. It takes about 3 months after the procedure for the tubes to become blocked. You must use another form of birth control for these 3 months.  Female sterilization. This is when the female has the tubes that carry sperm tied off (vasectomy).This blocks sperm from entering the vagina during sexual intercourse. After the procedure, the man can still ejaculate fluid (semen). NATURAL PLANNING METHODS  Natural family planning. This is not having sexual intercourse or using a barrier method (condom, diaphragm, cervical cap) on days the woman could become pregnant.  Calendar method. This is keeping track of the length of each menstrual cycle and identifying when you are fertile.  Ovulation method. This is avoiding sexual intercourse during ovulation.  Symptothermal method. This is avoiding sexual intercourse during ovulation, using a   thermometer and ovulation symptoms.  Post-ovulation method. This is timing sexual intercourse after you have ovulated. Regardless of which type or method of contraception you choose, it is important that you use condoms to protect against the transmission of sexually transmitted infections (STIs). Talk with your health care provider about which form of contraception is most appropriate for you. Document Released: 09/10/2005 Document Revised: 09/15/2013 Document Reviewed: 03/05/2013 ExitCare Patient  Information 2015 [employer]. This information is not intended to replace advice given to you by your health care provider. Make sure you discuss any questions you have with your health care provider.  

## 2015-04-28 NOTE — MAU Note (Signed)
Pt states she had unprotected sex 3 weeks ago and the person told her that he was on the last day of treatment for chlamydia. Also reports she has had some nausea, LMP 07/28. Wants a preg test and STD testing.

## 2015-04-29 LAB — HIV ANTIBODY (ROUTINE TESTING W REFLEX): HIV SCREEN 4TH GENERATION: NONREACTIVE

## 2015-04-29 LAB — GC/CHLAMYDIA PROBE AMP (~~LOC~~) NOT AT ARMC
Chlamydia: NEGATIVE
Neisseria Gonorrhea: NEGATIVE

## 2015-10-23 ENCOUNTER — Inpatient Hospital Stay (HOSPITAL_COMMUNITY)
Admission: AD | Admit: 2015-10-23 | Discharge: 2015-10-24 | Disposition: A | Discharge status: Home / Self Care | Payer: Medicaid Other | Source: Ambulatory Visit | Attending: Obstetrics & Gynecology | Admitting: Obstetrics & Gynecology

## 2015-10-23 DIAGNOSIS — B9689 Other specified bacterial agents as the cause of diseases classified elsewhere: Secondary | ICD-10-CM

## 2015-10-23 DIAGNOSIS — Z87891 Personal history of nicotine dependence: Secondary | ICD-10-CM | POA: Insufficient documentation

## 2015-10-23 DIAGNOSIS — N760 Acute vaginitis: Secondary | ICD-10-CM | POA: Diagnosis not present

## 2015-10-23 DIAGNOSIS — R030 Elevated blood-pressure reading, without diagnosis of hypertension: Secondary | ICD-10-CM | POA: Diagnosis not present

## 2015-10-23 DIAGNOSIS — R102 Pelvic and perineal pain: Secondary | ICD-10-CM | POA: Diagnosis not present

## 2015-10-23 DIAGNOSIS — D649 Anemia, unspecified: Secondary | ICD-10-CM | POA: Diagnosis not present

## 2015-10-23 DIAGNOSIS — R1031 Right lower quadrant pain: Secondary | ICD-10-CM | POA: Diagnosis present

## 2015-10-23 NOTE — MAU Note (Signed)
Pt c/o RLQ pain that started earlier in the week. Describes it as pressure and rates 4/10. States that boyfriends grandmother gave her 1 tramadol tonight. "Did not help with pain but helped with her dizziness." Denies vag bleeding or discharge. Feeling lightheaded and dizzy. LMP: 10/16/2015.

## 2015-10-24 ENCOUNTER — Encounter (HOSPITAL_COMMUNITY): Payer: Self-pay

## 2015-10-24 LAB — URINALYSIS, ROUTINE W REFLEX MICROSCOPIC
Bilirubin Urine: NEGATIVE
Glucose, UA: NEGATIVE mg/dL
Ketones, ur: NEGATIVE mg/dL
LEUKOCYTES UA: NEGATIVE
Nitrite: NEGATIVE
Protein, ur: NEGATIVE mg/dL
SPECIFIC GRAVITY, URINE: 1.02 (ref 1.005–1.030)
pH: 5.5 (ref 5.0–8.0)

## 2015-10-24 LAB — URINE MICROSCOPIC-ADD ON: RBC / HPF: NONE SEEN RBC/hpf (ref 0–5)

## 2015-10-24 LAB — COMPREHENSIVE METABOLIC PANEL
ALT: 15 U/L (ref 14–54)
ANION GAP: 6 (ref 5–15)
AST: 19 U/L (ref 15–41)
Albumin: 4 g/dL (ref 3.5–5.0)
Alkaline Phosphatase: 61 U/L (ref 38–126)
BUN: 12 mg/dL (ref 6–20)
CO2: 28 mmol/L (ref 22–32)
CREATININE: 0.79 mg/dL (ref 0.44–1.00)
Calcium: 9.4 mg/dL (ref 8.9–10.3)
Chloride: 104 mmol/L (ref 101–111)
Glucose, Bld: 95 mg/dL (ref 65–99)
Potassium: 4.3 mmol/L (ref 3.5–5.1)
Sodium: 138 mmol/L (ref 135–145)
Total Bilirubin: 0.3 mg/dL (ref 0.3–1.2)
Total Protein: 7.8 g/dL (ref 6.5–8.1)

## 2015-10-24 LAB — CBC
HCT: 35.1 % — ABNORMAL LOW (ref 36.0–46.0)
Hemoglobin: 10.9 g/dL — ABNORMAL LOW (ref 12.0–15.0)
MCH: 24 pg — ABNORMAL LOW (ref 26.0–34.0)
MCHC: 31.1 g/dL (ref 30.0–36.0)
MCV: 77.1 fL — AB (ref 78.0–100.0)
Platelets: 377 10*3/uL (ref 150–400)
RBC: 4.55 MIL/uL (ref 3.87–5.11)
RDW: 15 % (ref 11.5–15.5)
WBC: 8.2 10*3/uL (ref 4.0–10.5)

## 2015-10-24 LAB — WET PREP, GENITAL
Sperm: NONE SEEN
TRICH WET PREP: NONE SEEN
YEAST WET PREP: NONE SEEN

## 2015-10-24 LAB — GC/CHLAMYDIA PROBE AMP (~~LOC~~) NOT AT ARMC
Chlamydia: NEGATIVE
Neisseria Gonorrhea: NEGATIVE

## 2015-10-24 LAB — POCT PREGNANCY, URINE: PREG TEST UR: NEGATIVE

## 2015-10-24 MED ORDER — IBUPROFEN 600 MG PO TABS: 600.0000 mg | ORAL_TABLET | Freq: Four times a day (QID) | ORAL | Status: DC | PRN

## 2015-10-24 MED ORDER — FERROUS SULFATE 325 (65 FE) MG PO TABS: 325.0000 mg | ORAL_TABLET | Freq: Two times a day (BID) | ORAL | Status: DC

## 2015-10-24 MED ORDER — METRONIDAZOLE 500 MG PO TABS: 500.0000 mg | ORAL_TABLET | Freq: Two times a day (BID) | ORAL | Status: DC

## 2015-10-24 NOTE — MAU Provider Note (Signed)
History   [id number]   Chief Complaint  Patient presents with  . Abdominal Pain    HPI Lori Short is a 20 y.o. female  G0P0 here with  Pt reports RLQ pain that started earlier in the week. Describes it as pressure and rates 4/10. States that boyfriends grandmother gave her 1 tramadol tonight. "Did not help with pain but helped with her dizziness."   Denies vag bleeding or discharge. After completing her menses patient reports feeling lightheaded and dizzy.  No hx of LOC.    Patient's last menstrual period was 10/16/2015.  OB History  Gravida Para Term Preterm AB SAB TAB Ectopic Multiple Living  0                 Past Medical History  Diagnosis Date  . Bronchitis   . Headache     Family History  Problem Relation Age of Onset  . Hypertension Maternal Grandmother   . Heart disease Maternal Grandmother     Social History   Social History  . Marital Status: Single    Spouse Name: N/A  . Number of Children: N/A  . Years of Education: N/A   Occupational History  . unemployed    Social History Main Topics  . Smoking status: Former Smoker -- 0.25 packs/day for 1 years    Types: Cigarettes    Quit date: 04/14/2015  . Smokeless tobacco: Never Used  . Alcohol Use: Yes     Comment: occ  . Drug Use: No     Comment: occ, last used May 2016  . Sexual Activity:    Partners: Male    Birth Control/ Protection: None     Comment: implant was to be removed in May   Other Topics Concern  . None   Social History Narrative    Allergies  Allergen Reactions  . Shellfish Allergy Hives and Nausea And Vomiting    No current facility-administered medications on file prior to encounter.   Current Outpatient Prescriptions on File Prior to Encounter  Medication Sig Dispense Refill  . metroNIDAZOLE (FLAGYL) 500 MG tablet Take 1 tablet (500 mg total) by mouth 2 (two) times daily. 14 tablet 0     Review of Systems  Constitutional: Negative for fever.  Gastrointestinal:  Positive for nausea. Negative for vomiting.  Genitourinary: Positive for vaginal bleeding, vaginal discharge and pelvic pain. Negative for dysuria and frequency.  Neurological: Positive for dizziness and light-headedness.     Physical Exam   Filed Vitals:   10/23/15 2338  BP: 155/87  Pulse: 80  Temp: 98.1 F (36.7 C)  Resp: 18  Height: 5' 4.5" (1.638 m)  Weight: 97.251 kg (214 lb 6.4 oz)  SpO2: 98%    Physical Exam  Constitutional: [name], place, and time. [name]. No distress.  HENT:  Head: Normocephalic.  Neck: Normal range of motion. Neck supple.  Cardiovascular: Normal rate, regular rhythm and normal heart sounds.   Respiratory: Effort normal and breath sounds normal. No respiratory distress.  GI: Soft. She exhibits no mass. There is no tenderness. There is no rebound, no guarding and no CVA tenderness.  Genitourinary: Cervix exhibits no motion tenderness and no discharge. Vaginal discharge (white, creamy) found.  Musculoskeletal: Normal range of motion. She exhibits no edema.  Neurological: [name], place, and time.  Skin: Skin is warm and dry.  Psychiatric: She has a normal mood and affect.  MAU Course  Procedures  MDM Results for orders placed or performed during the hospital encounter of 10/23/15 (from the past 24 hour(s))  Urinalysis, Routine w reflex microscopic (not at [facility])     Status: Abnormal   Collection Time: 10/23/15 11:40 PM  Result Value Ref Range   Color, Urine YELLOW YELLOW   APPearance CLEAR CLEAR   Specific Gravity, Urine 1.020 1.005 - 1.030   pH 5.5 5.0 - 8.0   Glucose, UA NEGATIVE NEGATIVE mg/dL   Hgb urine dipstick TRACE (A) NEGATIVE   Bilirubin Urine NEGATIVE NEGATIVE   Ketones, ur NEGATIVE NEGATIVE mg/dL   Protein, ur NEGATIVE NEGATIVE mg/dL   Nitrite NEGATIVE NEGATIVE   Leukocytes, UA NEGATIVE NEGATIVE  Urine microscopic-add on     Status: Abnormal    Collection Time: 10/23/15 11:40 PM  Result Value Ref Range   Squamous Epithelial / LPF 0-5 (A) NONE SEEN   WBC, UA 0-5 0 - 5 WBC/hpf   RBC / HPF NONE SEEN 0 - 5 RBC/hpf   Bacteria, UA RARE (A) NONE SEEN  Pregnancy, urine POC     Status: None   Collection Time: 10/24/15 12:39 AM  Result Value Ref Range   Preg Test, Ur NEGATIVE NEGATIVE  CBC     Status: Abnormal   Collection Time: 10/24/15  1:41 AM  Result Value Ref Range   WBC 8.2 4.0 - 10.5 K/uL   RBC 4.55 3.87 - 5.11 MIL/uL   Hemoglobin 10.9 (L) 12.0 - 15.0 g/dL   HCT 16.1 (L) 09.6 - 04.5 %   MCV 77.1 (L) 78.0 - 100.0 fL   MCH 24.0 (L) 26.0 - 34.0 pg   MCHC 31.1 30.0 - 36.0 g/dL   RDW 40.9 81.1 - 91.4 %   Platelets 377 150 - 400 K/uL  Comprehensive metabolic panel     Status: None   Collection Time: 10/24/15  1:41 AM  Result Value Ref Range   Sodium 138 135 - 145 mmol/L   Potassium 4.3 3.5 - 5.1 mmol/L   Chloride 104 101 - 111 mmol/L   CO2 28 22 - 32 mmol/L   Glucose, Bld 95 65 - 99 mg/dL   BUN 12 6 - 20 mg/dL   Creatinine, Ser 7.82 0.44 - 1.00 mg/dL   Calcium 9.4 8.9 - 95.6 mg/dL   Total Protein 7.8 6.5 - 8.1 g/dL   Albumin 4.0 3.5 - 5.0 g/dL   AST 19 15 - 41 U/L   ALT 15 14 - 54 U/L   Alkaline Phosphatase 61 38 - 126 U/L   Total Bilirubin 0.3 0.3 - 1.2 mg/dL   GFR calc non Af Amer >60 >60 mL/min   GFR calc Af Amer >60 >60 mL/min   Anion gap 6 5 - 15   Microscopic wet-mount exam shows clue cells.   Assessment and Plan  Pelvic Pain Anemia Bacterial Vaginosis Elevated Blood Pressure  Plan: Discharge home RX Ibuprofen 600 mg q 6-8 hours RX Flagyl RX Ferrous Sulfate Recheck blood pressure in one week > given list of PCP resources Outpatient pelvic ultrasound   Walidah N Karim, CNM 10/24/2015 2:44 AM

## 2016-01-18 ENCOUNTER — Ambulatory Visit (HOSPITAL_COMMUNITY)
Admission: RE | Admit: 2016-01-18 | Discharge: 2016-01-18 | Disposition: A | Discharge status: Home / Self Care | Payer: Medicaid Other | Source: Ambulatory Visit | Attending: Family | Admitting: Family

## 2016-01-18 DIAGNOSIS — R938 Abnormal findings on diagnostic imaging of other specified body structures: Secondary | ICD-10-CM | POA: Insufficient documentation

## 2016-01-18 DIAGNOSIS — R102 Pelvic and perineal pain: Secondary | ICD-10-CM

## 2016-01-24 ENCOUNTER — Telehealth: Payer: Self-pay | Admitting: General Practice

## 2016-01-24 ENCOUNTER — Encounter: Payer: Self-pay | Admitting: General Practice

## 2016-01-24 NOTE — Telephone Encounter (Signed)
Telephone call to patient regarding ultrasound results. No answer- left message stating we are trying to reach you with non urgent results, you may call us back at the clinics. Will send letter

## 2016-02-08 ENCOUNTER — Emergency Department (HOSPITAL_COMMUNITY)
Admission: EM | Admit: 2016-02-08 | Discharge: 2016-02-08 | Disposition: A | Discharge status: Home / Self Care | Payer: Medicaid Other | Attending: Emergency Medicine | Admitting: Emergency Medicine

## 2016-02-08 ENCOUNTER — Encounter (HOSPITAL_COMMUNITY): Payer: Self-pay

## 2016-02-08 DIAGNOSIS — Z87891 Personal history of nicotine dependence: Secondary | ICD-10-CM | POA: Insufficient documentation

## 2016-02-08 DIAGNOSIS — Z791 Long term (current) use of non-steroidal anti-inflammatories (NSAID): Secondary | ICD-10-CM | POA: Insufficient documentation

## 2016-02-08 DIAGNOSIS — N62 Hypertrophy of breast: Secondary | ICD-10-CM | POA: Insufficient documentation

## 2016-02-08 DIAGNOSIS — Z792 Long term (current) use of antibiotics: Secondary | ICD-10-CM | POA: Insufficient documentation

## 2016-02-08 DIAGNOSIS — IMO0002 Reserved for concepts with insufficient information to code with codable children: Secondary | ICD-10-CM

## 2016-02-08 DIAGNOSIS — Z79899 Other long term (current) drug therapy: Secondary | ICD-10-CM | POA: Insufficient documentation

## 2016-02-08 LAB — PREGNANCY, URINE: Preg Test, Ur: NEGATIVE

## 2016-02-08 LAB — I-STAT BETA HCG BLOOD, ED (MC, WL, AP ONLY): I-stat hCG, quantitative: <5 m[IU]/mL (ref <5)

## 2016-02-08 MED ORDER — IBUPROFEN 800 MG PO TABS: 800.0000 mg | ORAL_TABLET | Freq: Three times a day (TID) | ORAL | Status: DC | PRN

## 2016-02-08 NOTE — Discharge Instructions (Signed)
Your tests were normal here today. You will need to see the clinic provided for further evaluation.

## 2016-02-08 NOTE — ED Notes (Signed)
She states that in the past month both of her breasts have become significantly larger (both equally) with some bilat. Breast tenderness.  She states she had a normal period about a week ago.  She denies fever, nor any other sign of current illness.

## 2016-02-08 NOTE — ED Provider Notes (Signed)
CSN: [id number]     Arrival date & time 02/08/16  1506 History  By signing my name below, I, Linna Darner, attest that this documentation has been prepared under the direction and in the presence of non-physician practitioner, Ebbie Ridge, PA-C. Electronically Signed: Linna Darner, Scribe. 02/08/2016. 4:16 PM.    Chief Complaint  Patient presents with  . Breast Problem    The history is provided by the patient. No language interpreter was used.     HPI Comments: Lori Short is a 20 y.o. female who presents to the Emergency Department complaining of gradual bilateral breast enlargement, pain, and tenderness over the last month. Pt states that her breasts feel heavier and states that they both feel "solid." Pt has not used new medications recently. Her LNMP was one week ago. She endorses pain exacerbation with palpation to her bilateral breasts. She denies vaginal bleeding, vaginal discharge, breast wounds, redness, increased warmth, vomiting, fever, or any other associated symptoms. Pt states that she has a gynecologist.  Past Medical History  Diagnosis Date  . Bronchitis   . Headache    Past Surgical History  Procedure Laterality Date  . No past surgeries     Family History  Problem Relation Age of Onset  . Hypertension Maternal Grandmother   . Heart disease Maternal Grandmother    Social History  Substance Use Topics  . Smoking status: Former Smoker -- 0.25 packs/day for 1 years    Types: Cigarettes    Quit date: 04/14/2015  . Smokeless tobacco: Never Used  . Alcohol Use: Yes     Comment: occ   OB History    Gravida Para Term Preterm AB TAB SAB Ectopic Multiple Living   0              Review of Systems  All other systems negative except as documented in the HPI. All pertinent positives and negatives as reviewed in the HPI.  Allergies  Shellfish allergy  Home Medications   Prior to Admission medications   Medication Sig Start Date End Date Taking?  Authorizing Provider  ferrous sulfate (FERROUSUL) 325 (65 FE) MG tablet Take 1 tablet (325 mg total) by mouth 2 (two) times daily. 10/24/15   Marlis Edelson, CNM  ibuprofen (ADVIL,MOTRIN) 600 MG tablet Take 1 tablet (600 mg total) by mouth every 6 (six) hours as needed. 10/24/15   Marlis Edelson, CNM  metroNIDAZOLE (FLAGYL) 500 MG tablet Take 1 tablet (500 mg total) by mouth 2 (two) times daily. 10/24/15   Marlis Edelson, CNM   BP 123/61 mmHg  Pulse 68  Temp(Src) 98 F (36.7 C) (Oral)  Resp 18  SpO2 100%  LMP 02/01/2016 Physical Exam  Constitutional: [name], place, and time. [name]. No distress.  HENT:  Head: Normocephalic and atraumatic.  Cardiovascular: Normal rate, regular rhythm and normal heart sounds.   Pulmonary/Chest: Effort normal and breath sounds normal. No respiratory distress.  Bilateral breast tenderness on palpation No masses No induration No nipple discharge No redness No increased warmth  Neurological: [name], place, and time.  Skin: She is not diaphoretic.    ED Course  Procedures (including critical care time)  DIAGNOSTIC STUDIES: Oxygen Saturation is 100% on RA, normal by my interpretation.    COORDINATION OF CARE: 4:16 PM Discussed treatment plan with pt at bedside and pt agreed to plan.  Labs Review Labs Reviewed  PREGNANCY, URINE  Imaging Review No results found. I have personally reviewed and evaluated these images and lab results as part of my medical decision-making.  [facility]  This time there is no sign of infection or other abnormality of the breast  I advised her this could be another issue that is causing her breast to swell.  Told to return here as needed  Charlestine NightChristopher Chima Astorino, PA-C 02/08/16 2203  Mancel BaleElliott Wentz, MD 02/10/16 [phone]

## 2016-04-04 ENCOUNTER — Emergency Department (HOSPITAL_COMMUNITY)
Admission: EM | Admit: 2016-04-04 | Discharge: 2016-04-04 | Disposition: A | Discharge status: Home / Self Care | Payer: Medicaid Other | Attending: Emergency Medicine | Admitting: Emergency Medicine

## 2016-04-04 ENCOUNTER — Encounter (HOSPITAL_COMMUNITY): Payer: Self-pay

## 2016-04-04 DIAGNOSIS — Z87891 Personal history of nicotine dependence: Secondary | ICD-10-CM | POA: Insufficient documentation

## 2016-04-04 DIAGNOSIS — K219 Gastro-esophageal reflux disease without esophagitis: Secondary | ICD-10-CM | POA: Insufficient documentation

## 2016-04-04 DIAGNOSIS — R0602 Shortness of breath: Secondary | ICD-10-CM | POA: Insufficient documentation

## 2016-04-04 LAB — I-STAT BETA HCG BLOOD, ED (MC, WL, AP ONLY)

## 2016-04-04 LAB — POTASSIUM: Potassium: 4 mmol/L (ref 3.5–5.1)

## 2016-04-04 MED ORDER — OMEPRAZOLE 20 MG PO CPDR: 20.0000 mg | DELAYED_RELEASE_CAPSULE | Freq: Every day | ORAL | Status: DC

## 2016-04-04 MED ORDER — SUCRALFATE 1 G PO TABS: 1.0000 g | ORAL_TABLET | Freq: Four times a day (QID) | ORAL | Status: DC | PRN

## 2016-04-04 NOTE — ED Provider Notes (Signed)
CSN: [id number]     Arrival date & time 04/04/16  0111 History   First MD Initiated Contact with Patient 04/04/16 0340     Chief Complaint  Patient presents with  . Fatigue     (Consider location/radiation/quality/duration/timing/severity/associated sxs/prior Treatment) HPI Comments: 20 year old female with a history of bronchitis and headaches presents to the emergency department for evaluation of lightheadedness and shortness of breath which are present after eating. She states that she has had these symptoms for the past few days. She denies any lightheadedness or shortness of breath that is not associated with meals. She feels as though she has been more fatigued lately. She denies taking any medications to try and alleviate her symptoms. She states that she is concerned that she may be diabetic. She states that her mother has been making her concerned with this. She has not had any recent fevers. No vomiting, chest pain, abdominal pain, diarrhea, cough or hemoptysis, syncope, leg swelling, recent surgeries or hospitalizations. She is not on birth control. She denies a history of esophageal reflux.  The history is provided by the patient. No language interpreter was used.    Past Medical History  Diagnosis Date  . Bronchitis   . Headache    Past Surgical History  Procedure Laterality Date  . No past surgeries     Family History  Problem Relation Age of Onset  . Hypertension Maternal Grandmother   . Heart disease Maternal Grandmother    Social History  Substance Use Topics  . Smoking status: Former Smoker -- 0.25 packs/day for 1 years    Types: Cigarettes    Quit date: 04/14/2015  . Smokeless tobacco: Never Used  . Alcohol Use: Yes     Comment: occ   OB History    Gravida Para Term Preterm AB TAB SAB Ectopic Multiple Living   0               Review of Systems  Constitutional: Negative for fever.  Respiratory: Positive for shortness of breath.   Gastrointestinal:  Negative for vomiting and abdominal pain.  Neurological: Positive for light-headedness. Negative for syncope.  All other systems reviewed and are negative.   Allergies  Shellfish allergy  Home Medications   Prior to Admission medications   Medication Sig Start Date End Date Taking? Authorizing Provider  ferrous sulfate (FERROUSUL) 325 (65 FE) MG tablet Take 1 tablet (325 mg total) by mouth 2 (two) times daily. Patient not taking: Reported on 04/04/2016 10/24/15   Marlis Edelson, CNM  ibuprofen (ADVIL,MOTRIN) 800 MG tablet Take 1 tablet (800 mg total) by mouth every 8 (eight) hours as needed. Patient not taking: Reported on 04/04/2016 02/08/16   Charlestine Night, PA-C  metroNIDAZOLE (FLAGYL) 500 MG tablet Take 1 tablet (500 mg total) by mouth 2 (two) times daily. Patient not taking: Reported on 04/04/2016 10/24/15   Marlis Edelson, CNM  omeprazole (PRILOSEC) 20 MG capsule Take 1 capsule (20 mg total) by mouth daily. 04/04/16   Antony Madura, PA-C  sucralfate (CARAFATE) 1 g tablet Take 1 tablet (1 g total) by mouth 4 (four) times daily as needed (for discomfort associated with meals). 04/04/16   Antony Madura, PA-C   BP 113/74 mmHg  Pulse 81  Temp(Src) 98.5 F (36.9 C) (Oral)  Resp 20  SpO2 99%  LMP 03/24/2016   Physical Exam  Constitutional: [name], place, and time. [name]. No distress.  Nontoxic/nonseptic appearing  HENT:  Head: Normocephalic and atraumatic.  Eyes: Conjunctivae and EOM are normal. No scleral icterus.  Neck: Normal range of motion.  Cardiovascular: Normal rate, regular rhythm and intact distal pulses.   Pulmonary/Chest: Effort normal and breath sounds normal. No respiratory distress. She has no wheezes. She has no rales.  Respirations even and unlabored. Chest expansion symmetric.  Abdominal: Soft. She exhibits no distension. There is no tenderness. There is no rebound and no guarding.  Soft, nontender abdomen.  No masses or peritoneal signs.  Musculoskeletal: Normal range of motion.  Neurological: [name], place, and time. She exhibits normal muscle tone. Coordination normal.  GCS 15. Patient ambulatory with steady gait.  Skin: Skin is warm and dry. No rash noted. She is not diaphoretic. No erythema. No pallor.  Psychiatric: She has a normal mood and affect. Her behavior is normal.  Nursing note and vitals reviewed.   ED Course  Procedures (including critical care time) Labs Review Labs Reviewed  I-STAT CHEM 8, ED - Abnormal; Notable for the following:    Potassium 7.9 (*)    Calcium, Ion 1.11 (*)    All other components within normal limits  POTASSIUM  I-STAT BETA HCG BLOOD, ED ([facility], [facility], [facility] ONLY)    Imaging Review No results found.   I have personally reviewed and evaluated these images and lab results as part of my medical decision-making.   EKG Interpretation None      MDM   Final diagnoses:  Gastroesophageal reflux disease, esophagitis presence not specified    20 year old female presents to the emergency department for mild lightheadedness and shortness of breath associated with eating. She also complains of mild fatigue. Electrolytes are reassuring. Patient is not anemic. She has a normal creatinine. Pregnancy is negative. Physical exam without abdominal tenderness to palpation. Suspect that symptoms timed with eating are secondary to esophageal reflux. No RUQ TTP, nausea, or vomiting to suggest biliary colic. Atypical PE considered; however, patient is PERC negative. Doubt PE given symptom presentation/history. No indication for further emergent workup at this time. Will discharge with prescription for Prilosec and Carafate. Return precautions discussed and provided. Patient discharged in satisfactory condition with no unaddressed concerns.   Filed Vitals:   04/04/16 0149  BP: 113/74  Pulse: 81  Temp: 98.5 F (36.9 C)  TempSrc: Oral  Resp: 20   SpO2: 99%     Antony MaduraKelly Layliana Devins, PA-C 04/04/16 0450  April Palumbo, MD 04/04/16 [phone]

## 2016-04-04 NOTE — ED Notes (Signed)
Pt wants to know if she's a diabetic, she states that she's dizzy a lot and she feels very tired

## 2016-04-04 NOTE — Discharge Instructions (Signed)

## 2016-04-09 LAB — I-STAT CHEM 8, ED
BUN: 12 mg/dL (ref 6–20)
CALCIUM ION: 1.11 mmol/L — AB (ref 1.13–1.30)
CHLORIDE: 102 mmol/L (ref 101–111)
CREATININE: 0.6 mg/dL (ref 0.44–1.00)
GLUCOSE: 95 mg/dL (ref 65–99)
HCT: 37 % (ref 36.0–46.0)
HEMOGLOBIN: 12.6 g/dL (ref 12.0–15.0)
Potassium: 7.9 mmol/L (ref 3.5–5.1)
SODIUM: 135 mmol/L (ref 135–145)
TCO2: 28 mmol/L (ref 0–100)

## 2016-07-31 ENCOUNTER — Encounter (HOSPITAL_COMMUNITY): Payer: Self-pay | Admitting: *Deleted

## 2016-07-31 ENCOUNTER — Inpatient Hospital Stay (HOSPITAL_COMMUNITY): Payer: Medicaid Other

## 2016-07-31 ENCOUNTER — Inpatient Hospital Stay (HOSPITAL_COMMUNITY)
Admission: AD | Admit: 2016-07-31 | Discharge: 2016-07-31 | Disposition: A | Discharge status: Home / Self Care | Payer: Medicaid Other | Source: Ambulatory Visit | Attending: Family Medicine | Admitting: Family Medicine

## 2016-07-31 DIAGNOSIS — O3680X0 Pregnancy with inconclusive fetal viability, not applicable or unspecified: Secondary | ICD-10-CM

## 2016-07-31 DIAGNOSIS — Z3A01 Less than 8 weeks gestation of pregnancy: Secondary | ICD-10-CM | POA: Insufficient documentation

## 2016-07-31 DIAGNOSIS — O26899 Other specified pregnancy related conditions, unspecified trimester: Secondary | ICD-10-CM

## 2016-07-31 DIAGNOSIS — O26891 Other specified pregnancy related conditions, first trimester: Secondary | ICD-10-CM | POA: Diagnosis not present

## 2016-07-31 DIAGNOSIS — R109 Unspecified abdominal pain: Secondary | ICD-10-CM | POA: Diagnosis not present

## 2016-07-31 DIAGNOSIS — Z87891 Personal history of nicotine dependence: Secondary | ICD-10-CM | POA: Diagnosis not present

## 2016-07-31 LAB — URINALYSIS, ROUTINE W REFLEX MICROSCOPIC
BILIRUBIN URINE: NEGATIVE
GLUCOSE, UA: NEGATIVE mg/dL
KETONES UR: NEGATIVE mg/dL
Leukocytes, UA: NEGATIVE
Nitrite: NEGATIVE
PROTEIN: NEGATIVE mg/dL
Specific Gravity, Urine: 1.025 (ref 1.005–1.030)
pH: 6 (ref 5.0–8.0)

## 2016-07-31 LAB — WET PREP, GENITAL
Sperm: NONE SEEN
Trich, Wet Prep: NONE SEEN
Yeast Wet Prep HPF POC: NONE SEEN

## 2016-07-31 LAB — CBC
HCT: 34.9 % — ABNORMAL LOW (ref 36.0–46.0)
HEMOGLOBIN: 10.9 g/dL — AB (ref 12.0–15.0)
MCH: 23.1 pg — AB (ref 26.0–34.0)
MCHC: 31.2 g/dL (ref 30.0–36.0)
MCV: 73.9 fL — ABNORMAL LOW (ref 78.0–100.0)
PLATELETS: 434 10*3/uL — AB (ref 150–400)
RBC: 4.72 MIL/uL (ref 3.87–5.11)
RDW: 16.3 % — AB (ref 11.5–15.5)
WBC: 7.4 10*3/uL (ref 4.0–10.5)

## 2016-07-31 LAB — URINE MICROSCOPIC-ADD ON

## 2016-07-31 LAB — POCT PREGNANCY, URINE: PREG TEST UR: POSITIVE — AB

## 2016-07-31 LAB — HCG, QUANTITATIVE, PREGNANCY: HCG, BETA CHAIN, QUANT, S: 1698 m[IU]/mL — AB (ref <5)

## 2016-07-31 NOTE — Progress Notes (Signed)
MAU HISTORY AND PHYSICAL  Chief Complaint:  Abdominal Cramping; Headache; and Possible Pregnancy   Lori Short is a 20 y.o. G1P0 at 3450w2d by LMP presenting for Abdominal Cramping; Headache; and Possible Pregnancy  She reports that she has been having abdominal pain for approximately 2-3 weeks, that she describes as similar to menstrual cramping, but more painful. She endorses associated nausea, non-bilious vomiting, and intermittent loose stools along with the abdominal pain. Denies hematemesis, bilious vomiting, hematochezia.  She also reports some associated vaginal tenderness and abdominal bloating. Denies vaginal bleeding or discharge. States that she missed her last menstrual period which was supposed to happen a "couple of weeks ago". Along with abdominal pain, patient complains of headache for approximately the same timeframe of 2-3 weeks.  She describes her headache as located "all over" and the pain "comes and goes". Denies changes in vision, dizziness, or history of recent falls. Has not taken any medications to attempt to relieve headache.   Denies shortness or breath, chest pain, dysuria, urinary frequency, or bleeding from vagina  Patient states that she has unprotected sex with the same sexual partner for the past year.  Of note, she reports a past history significant for Chlamydia within the past 1-2 years, and her partner states a history significant for gonorrhea within the past 1-2 years. PMH otherwise unremarkable.   Patient states occasional marijuana use approximately 3-4 times a week, with the last use being last night.  She also smokes cigarettes occasionally as well.  Drinks wine and champagne socially.    Past Medical History:  Diagnosis Date  . Bronchitis   . Headache     Past Surgical History:  Procedure Laterality Date  . NO PAST SURGERIES      Family History  Problem Relation Age of Onset  . Hypertension Maternal Grandmother   . Heart disease Maternal  Grandmother     Social History  Substance Use Topics  . Smoking status: Former Smoker    Packs/day: 0.25    Years: 1.00    Types: Cigarettes    Quit date: 04/14/2015  . Smokeless tobacco: Never Used  . Alcohol use Yes     Comment: occ    Allergies  Allergen Reactions  . Shellfish Allergy Hives and Nausea And Vomiting    Prescriptions Prior to Admission  Medication Sig Dispense Refill Last Dose  . ferrous sulfate (FERROUSUL) 325 (65 FE) MG tablet Take 1 tablet (325 mg total) by mouth 2 (two) times daily. (Patient not taking: Reported on 04/04/2016) 60 tablet 1 Not Taking at Unknown time  . ibuprofen (ADVIL,MOTRIN) 800 MG tablet Take 1 tablet (800 mg total) by mouth every 8 (eight) hours as needed. (Patient not taking: Reported on 04/04/2016) 21 tablet 0 Completed Course at Unknown time  . metroNIDAZOLE (FLAGYL) 500 MG tablet Take 1 tablet (500 mg total) by mouth 2 (two) times daily. (Patient not taking: Reported on 04/04/2016) 14 tablet 0 Completed Course at Unknown time  . omeprazole (PRILOSEC) 20 MG capsule Take 1 capsule (20 mg total) by mouth daily. 30 capsule 1   . sucralfate (CARAFATE) 1 g tablet Take 1 tablet (1 g total) by mouth 4 (four) times daily as needed (for discomfort associated with meals). 30 tablet 1     Review of Systems - Negative except for what is mentioned in HPI.  Physical Exam  Blood pressure 128/67, pulse 92, temperature 98.4 F (36.9 C), temperature source Oral, resp. rate 16, height 5\' 4"  (1.626 m),  weight 216 lb 6.4 oz (98.2 kg), last menstrual period 07/01/2016. GENERAL: Well-developed, well-nourished female in no acute distress.  LUNGS: Clear to auscultation bilaterally.  HEART: Regular rate and rhythm. ABDOMEN: Soft, nontender, non-distended.  No rebound tenderness or guarding noted.    EXTREMITIES: Nontender, no edema, 2+ distal pulses. Pelvic Exam: No lesions of the external genitalia noted.  Scant, white discharge noted with speculum examination.   Cervix is mobile and non-tender.  No adnexal tenderness or inflammation appreciated.  Uterus non-enlarged on bi-manual exam.    Labs: Results for orders placed or performed during the hospital encounter of 07/31/16 (from the past 24 hour(s))  Urinalysis, Routine w reflex microscopic ([facility])   Collection Time: 07/31/16 10:07 AM  Result Value Ref Range   Color, Urine YELLOW YELLOW   APPearance CLEAR CLEAR   Specific Gravity, Urine 1.025 1.005 - 1.030   pH 6.0 5.0 - 8.0   Glucose, UA NEGATIVE NEGATIVE mg/dL   Hgb urine dipstick TRACE (A) NEGATIVE   Bilirubin Urine NEGATIVE NEGATIVE   Ketones, ur NEGATIVE NEGATIVE mg/dL   Protein, ur NEGATIVE NEGATIVE mg/dL   Nitrite NEGATIVE NEGATIVE   Leukocytes, UA NEGATIVE NEGATIVE  Urine microscopic-add on   Collection Time: 07/31/16 10:07 AM  Result Value Ref Range   Squamous Epithelial / LPF 0-5 (A) NONE SEEN   WBC, UA 0-5 0 - 5 WBC/hpf   RBC / HPF 0-5 0 - 5 RBC/hpf   Bacteria, UA RARE (A) NONE SEEN  Pregnancy, urine POC   Collection Time: 07/31/16 10:20 AM  Result Value Ref Range   Preg Test, Ur POSITIVE (A) NEGATIVE  CBC   Collection Time: 07/31/16 11:00 AM  Result Value Ref Range   WBC 7.4 4.0 - 10.5 K/uL   RBC 4.72 3.87 - 5.11 MIL/uL   Hemoglobin 10.9 (L) 12.0 - 15.0 g/dL   HCT 16.134.9 (L) 09.636.0 - 04.546.0 %   MCV 73.9 (L) 78.0 - 100.0 fL   MCH 23.1 (L) 26.0 - 34.0 pg   MCHC 31.2 30.0 - 36.0 g/dL   RDW 40.916.3 (H) 81.111.5 - 91.415.5 %   Platelets 434 (H) 150 - 400 K/uL    Imaging Studies:  No results found.  Assessment: Lori Short is  20 y.o. G1P0 at 6655w2d by LMP presents with Abdominal Cramping; Headache; and Possible Pregnancy .  Plan: CBC, quantitative beta hCG, transvaginal ultrasound, and blood typing for further workup of pregnancy.  Discussed options for OB follow-up with patient and she will decide on which clinic she desires at a later date.   Cloyd StagersMark S Phillips, PA-S 11/7/201711:32 AM  Midwife attestation:  I have seen  and examined this patient; I agree with above documentation in the student's note.   See MAU provider note for details.

## 2016-07-31 NOTE — MAU Provider Note (Signed)
History     CSN: [id number]  Arrival date and time: 07/31/16 0956   None     Chief Complaint  Patient presents with  . Abdominal Cramping  . Headache  . Possible Pregnancy   Lori Short is a 20 y.o. G1P0 at 2272w2d by LMP presenting for Abdominal Cramping; Headache; and Possible Pregnancy. She reports that she has been having abdominal pain for approximately 2-3 weeks, that she describes as similar to menstrual cramping, but more painful.  She also reports some associated vaginal tenderness and abdominal bloating.  She endorses associated nausea, non-bilious vomiting, and intermittent loose stools along with the abdominal pain. Denies hematemesis, bilious vomiting, hematochezia. Along with abdominal pain, patient complains of headache for approximately the same timeframe of 2-3 weeks.  She describes her headache as located "all over" and the pain "comes and goes". She denies vaginal bleeding, discharge or odor.    OB History    Gravida Para Term Preterm AB Living   1             SAB TAB Ectopic Multiple Live Births                  Past Medical History:  Diagnosis Date  . Bronchitis   . Headache     Past Surgical History:  Procedure Laterality Date  . NO PAST SURGERIES      Family History  Problem Relation Age of Onset  . Hypertension Maternal Grandmother   . Heart disease Maternal Grandmother     Social History  Substance Use Topics  . Smoking status: Former Smoker    Packs/day: 0.25    Years: 1.00    Types: Cigarettes    Quit date: 04/14/2015  . Smokeless tobacco: Never Used  . Alcohol use Yes     Comment: occ    Allergies:  Allergies  Allergen Reactions  . Shellfish Allergy Hives and Nausea And Vomiting    Prescriptions Prior to Admission  Medication Sig Dispense Refill Last Dose  . ferrous sulfate (FERROUSUL) 325 (65 FE) MG tablet Take 1 tablet (325 mg total) by mouth 2 (two) times daily. (Patient not taking: Reported on 04/04/2016) 60 tablet 1 Not  Taking at Unknown time  . ibuprofen (ADVIL,MOTRIN) 800 MG tablet Take 1 tablet (800 mg total) by mouth every 8 (eight) hours as needed. (Patient not taking: Reported on 04/04/2016) 21 tablet 0 Completed Course at Unknown time  . metroNIDAZOLE (FLAGYL) 500 MG tablet Take 1 tablet (500 mg total) by mouth 2 (two) times daily. (Patient not taking: Reported on 04/04/2016) 14 tablet 0 Completed Course at Unknown time  . omeprazole (PRILOSEC) 20 MG capsule Take 1 capsule (20 mg total) by mouth daily. 30 capsule 1   . sucralfate (CARAFATE) 1 g tablet Take 1 tablet (1 g total) by mouth 4 (four) times daily as needed (for discomfort associated with meals). 30 tablet 1     Review of Systems  Constitutional: Negative.   Gastrointestinal: Positive for abdominal pain, nausea and vomiting.  Genitourinary: Negative.   Neurological: Positive for headaches.   Physical Exam   Blood pressure 128/67, pulse 92, temperature 98.4 F (36.9 C), temperature source Oral, resp. rate 16, height 5\' 4"  (1.626 m), weight 98.2 kg (216 lb 6.4 oz), last menstrual period 07/01/2016.  Physical Exam  Constitutional: [name], place, and time. [name]. No distress.  HENT:  Head: Normocephalic and atraumatic.  Neck: Normal range of motion.  Neck supple.  Cardiovascular: Normal rate.   GI: Soft. She exhibits no distension and no mass. There is no tenderness. There is no rebound and no guarding.  Genitourinary: Vagina normal and uterus normal.  Genitourinary Comments: External: no lesions Vagina: rugated, nulli, thin white discharge Uterus: non enlarged, anteverted, non tender, no CMT Adnexae: no masses, no tenderness left, no tenderness right   Musculoskeletal: Normal range of motion.  Neurological: [name], place, and time.  Skin: Skin is warm and dry.  Psychiatric: She has a normal mood and affect.   Results for orders placed or performed  during the hospital encounter of 07/31/16 (from the past 24 hour(s))  Urinalysis, Routine w reflex microscopic (not [facility])     Status: Abnormal   Collection Time: 07/31/16 10:07 AM  Result Value Ref Range   Color, Urine YELLOW YELLOW   APPearance CLEAR CLEAR   Specific Gravity, Urine 1.025 1.005 - 1.030   pH 6.0 5.0 - 8.0   Glucose, UA NEGATIVE NEGATIVE mg/dL   Hgb urine dipstick TRACE (A) NEGATIVE   Bilirubin Urine NEGATIVE NEGATIVE   Ketones, ur NEGATIVE NEGATIVE mg/dL   Protein, ur NEGATIVE NEGATIVE mg/dL   Nitrite NEGATIVE NEGATIVE   Leukocytes, UA NEGATIVE NEGATIVE  Urine microscopic-add on     Status: Abnormal   Collection Time: 07/31/16 10:07 AM  Result Value Ref Range   Squamous Epithelial / LPF 0-5 (A) NONE SEEN   WBC, UA 0-5 0 - 5 WBC/hpf   RBC / HPF 0-5 0 - 5 RBC/hpf   Bacteria, UA RARE (A) NONE SEEN  Pregnancy, urine POC     Status: Abnormal   Collection Time: 07/31/16 10:20 AM  Result Value Ref Range   Preg Test, Ur POSITIVE (A) NEGATIVE  CBC     Status: Abnormal   Collection Time: 07/31/16 11:00 AM  Result Value Ref Range   WBC 7.4 4.0 - 10.5 K/uL   RBC 4.72 3.87 - 5.11 MIL/uL   Hemoglobin 10.9 (L) 12.0 - 15.0 g/dL   HCT 40.934.9 (L) 81.136.0 - 91.446.0 %   MCV 73.9 (L) 78.0 - 100.0 fL   MCH 23.1 (L) 26.0 - 34.0 pg   MCHC 31.2 30.0 - 36.0 g/dL   RDW 78.216.3 (H) 95.611.5 - 21.315.5 %   Platelets 434 (H) 150 - 400 K/uL  hCG, quantitative, pregnancy     Status: Abnormal   Collection Time: 07/31/16 11:00 AM  Result Value Ref Range   hCG, Beta Chain, Quant, S 1,698 (H) <5 mIU/mL  Wet prep, genital     Status: Abnormal   Collection Time: 07/31/16 11:25 AM  Result Value Ref Range   Yeast Wet Prep HPF POC NONE SEEN NONE SEEN   Trich, Wet Prep NONE SEEN NONE SEEN   Clue Cells Wet Prep HPF POC PRESENT (A) NONE SEEN   WBC, Wet Prep HPF POC MODERATE (A) NONE SEEN   Sperm NONE SEEN    [place] Ob Comp Less 14 Wks  Result Date: 07/31/2016 CLINICAL DATA:  Pelvic pain and cramping for 4  weeks. Gestational age by LMP of 4 weeks 2 days. EXAM: OBSTETRIC <14 WK US AND TRANSVAGINAL OB US TECHNIQUE: Both transabdominal and transvaginal ultrasound examinations were performed for complete evaluation of the gestation as well as the maternal uterus, adnexal regions, and pelvic cul-de-sac. Transvaginal technique was performed to assess early pregnancy. COMPARISON:  01/18/2016 FINDINGS: Intrauterine gestational sac: Single Yolk sac:  Not Visualized. Embryo:  Not Visualized. MSD: 4  mm   5 w   0  d Subchorionic hemorrhage:  None visualized. Maternal uterus/adnexae: Both ovaries are normal in appearance. No mass or free fluid identified. IMPRESSION: Probable early intrauterine gestational sac, but no yolk sac, fetal pole, or cardiac activity yet visualized. Recommend follow-up quantitative B-HCG levels and follow-up US in 14 days to assess viability. This recommendation follows SRU consensus guidelines: Diagnostic Criteria for Nonviable Pregnancy Early in the First Trimester. Malva Limes Med 2013; 604:5409-81. Electronically Signed   By: Myles Rosenthal M.D.   On: 07/31/2016 13:05   US Ob Transvaginal  Result Date: 07/31/2016 CLINICAL DATA:  Pelvic pain and cramping for 4 weeks. Gestational age by LMP of 4 weeks 2 days. EXAM: OBSTETRIC <14 WK [place] AND TRANSVAGINAL OB US TECHNIQUE: Both transabdominal and transvaginal ultrasound examinations were performed for complete evaluation of the gestation as well as the maternal uterus, adnexal regions, and pelvic cul-de-sac. Transvaginal technique was performed to assess early pregnancy. COMPARISON:  01/18/2016 FINDINGS: Intrauterine gestational sac: Single Yolk sac:  Not Visualized. Embryo:  Not Visualized. MSD: 4  mm   5 w   0  d Subchorionic hemorrhage:  None visualized. Maternal uterus/adnexae: Both ovaries are normal in appearance. No mass or free fluid identified. IMPRESSION: Probable early intrauterine gestational sac, but no yolk sac, fetal pole, or cardiac activity  yet visualized. Recommend follow-up quantitative B-HCG levels and follow-up US in 14 days to assess viability. This recommendation follows SRU consensus guidelines: Diagnostic Criteria for Nonviable Pregnancy Early in the First Trimester. Malva Limes Med 2013; 191:4782-95. Electronically Signed   By: Myles Rosenthal M.D.   On: 07/31/2016 13:05    MAU Course  Procedures  MDM Labs and [place] ordered and reviewed. Likely early pregnancy but cannot r/o ectopic, will rpt quant in 2 days, if appropriately rising then rpt [place] in 10-14 days. Stable for discharge home.   Assessment and Plan   1. Abdominal pain during pregnancy in first trimester   2. Abdominal pain affecting pregnancy   3. Pregnancy of unknown anatomic location    Discharge home Follow up in [facility] in 2 days at 11:00 am Ectopic precautions    Medication List    STOP taking these medications   omeprazole 20 MG capsule Commonly known as:  PRILOSEC   sucralfate 1 g tablet Commonly known as:  Cipriano Mile, CNM 07/31/2016, 11:48 AM

## 2016-07-31 NOTE — Discharge Instructions (Signed)

## 2016-08-01 LAB — GC/CHLAMYDIA PROBE AMP (~~LOC~~) NOT AT ARMC
Chlamydia: NEGATIVE
NEISSERIA GONORRHEA: NEGATIVE

## 2016-08-02 ENCOUNTER — Ambulatory Visit: Payer: Medicaid Other | Admitting: *Deleted

## 2016-08-02 DIAGNOSIS — O3680X0 Pregnancy with inconclusive fetal viability, not applicable or unspecified: Secondary | ICD-10-CM

## 2016-08-02 LAB — HCG, QUANTITATIVE, PREGNANCY: HCG, BETA CHAIN, QUANT, S: 4310 m[IU]/mL — AB (ref <5)

## 2016-08-02 NOTE — Progress Notes (Signed)
Pts hcg today went from 1698 to 4310. Reviewed with Dr. Alysia PennaErvin. He recommends ultrasound in 2-3 weeks. Appointment scheduled for 11/27 at 1100. Pt informed. Advised that if she experiences severe pain or bleeding to come back to MAU. Pt is agreeable to this.

## 2016-08-02 NOTE — Progress Notes (Signed)
Patient here for stat bhcg today. Patient reports no bleeding and occasional cramping of pain at a 3. Discussed with patient that we will contact her with results around 1pm. Patient provided number of [phone] for results.

## 2016-08-15 ENCOUNTER — Inpatient Hospital Stay (HOSPITAL_COMMUNITY)
Admission: AD | Admit: 2016-08-15 | Discharge: 2016-08-15 | Disposition: A | Discharge status: Home / Self Care | Payer: Medicaid Other | Source: Ambulatory Visit | Attending: Family Medicine | Admitting: Family Medicine

## 2016-08-15 ENCOUNTER — Encounter (HOSPITAL_COMMUNITY): Payer: Self-pay | Admitting: *Deleted

## 2016-08-15 DIAGNOSIS — Z91013 Allergy to seafood: Secondary | ICD-10-CM | POA: Insufficient documentation

## 2016-08-15 DIAGNOSIS — Z3A01 Less than 8 weeks gestation of pregnancy: Secondary | ICD-10-CM | POA: Insufficient documentation

## 2016-08-15 DIAGNOSIS — O219 Vomiting of pregnancy, unspecified: Secondary | ICD-10-CM | POA: Insufficient documentation

## 2016-08-15 DIAGNOSIS — Z87891 Personal history of nicotine dependence: Secondary | ICD-10-CM | POA: Diagnosis not present

## 2016-08-15 DIAGNOSIS — Z8249 Family history of ischemic heart disease and other diseases of the circulatory system: Secondary | ICD-10-CM | POA: Diagnosis not present

## 2016-08-15 LAB — URINALYSIS, ROUTINE W REFLEX MICROSCOPIC
Bilirubin Urine: NEGATIVE
GLUCOSE, UA: NEGATIVE mg/dL
Hgb urine dipstick: NEGATIVE
KETONES UR: NEGATIVE mg/dL
LEUKOCYTES UA: NEGATIVE
Nitrite: NEGATIVE
PH: 7.5 (ref 5.0–8.0)
Protein, ur: NEGATIVE mg/dL
SPECIFIC GRAVITY, URINE: 1.015 (ref 1.005–1.030)

## 2016-08-15 MED ORDER — METOCLOPRAMIDE HCL 10 MG PO TABS: 10.0000 mg | ORAL_TABLET | Freq: Three times a day (TID) | ORAL | 1 refills | Status: DC

## 2016-08-15 MED ORDER — PROMETHAZINE HCL 25 MG PO TABS
25.0000 mg | ORAL_TABLET | Freq: Every evening | ORAL | 1 refills | Status: DC | PRN
Start: 2016-08-15 — End: 2017-01-15

## 2016-08-15 MED ORDER — RANITIDINE HCL 150 MG PO TABS: 150.0000 mg | ORAL_TABLET | Freq: Two times a day (BID) | ORAL | 0 refills | Status: DC

## 2016-08-15 MED ORDER — PROMETHAZINE HCL 25 MG PO TABS
25.0000 mg | ORAL_TABLET | Freq: Once | ORAL | Status: AC
  Administered 2016-08-15: 25 mg via ORAL
  Filled 2016-08-15: qty 1

## 2016-08-15 NOTE — MAU Note (Signed)
Urine in lab 

## 2016-08-15 NOTE — MAU Provider Note (Signed)
  History     CSN: [id number]  Arrival date and time: 08/15/16 [id number]   First Provider Initiated Contact with Patient 08/15/16 [phone]      Chief Complaint  Patient presents with  . Morning Sickness  . Dizziness   HPI   [name] is a 20 y.o. female G1P0 @ 7482w3d here in MAU with N/V. She vomits 3x per day, and feels nauseous all day long.   She denies abdominal pain or vaginal bleeding.    OB History    Gravida Para Term Preterm AB Living   1             SAB TAB Ectopic Multiple Live Births                  Past Medical History:  Diagnosis Date  . Bronchitis   . Headache     Past Surgical History:  Procedure Laterality Date  . NO PAST SURGERIES      Family History  Problem Relation Age of Onset  . Hypertension Maternal Grandmother   . Heart disease Maternal Grandmother     Social History  Substance Use Topics  . Smoking status: Former Smoker    Packs/day: 0.25    Years: 1.00    Types: Cigarettes    Quit date: 04/14/2015  . Smokeless tobacco: Former NeurosurgeonUser  . Alcohol use Yes     Comment: occ    Allergies:  Allergies  Allergen Reactions  . Shellfish Allergy Hives and Nausea And Vomiting    No prescriptions prior to admission.    Results for orders placed or performed during the hospital encounter of 08/15/16 (from the past 48 hour(s))  Urinalysis, Routine w reflex microscopic ([facility])     Status: Abnormal   Collection Time: 08/15/16  7:40 AM  Result Value Ref Range   Color, Urine YELLOW YELLOW   APPearance HAZY (A) CLEAR   Specific Gravity, Urine 1.015 1.005 - 1.030   pH 7.5 5.0 - 8.0   Glucose, UA NEGATIVE NEGATIVE mg/dL   Hgb urine dipstick NEGATIVE NEGATIVE   Bilirubin Urine NEGATIVE NEGATIVE   Ketones, ur NEGATIVE NEGATIVE mg/dL   Protein, ur NEGATIVE NEGATIVE mg/dL   Nitrite NEGATIVE NEGATIVE   Leukocytes, UA NEGATIVE NEGATIVE    Comment: MICROSCOPIC NOT DONE ON URINES WITH NEGATIVE PROTEIN, BLOOD, LEUKOCYTES, NITRITE, OR  GLUCOSE <1000 mg/dL.    Review of Systems  Gastrointestinal: Positive for heartburn, nausea and vomiting.   Physical Exam   Blood pressure 135/71, pulse 87, temperature 98.7 F (37.1 C), temperature source Oral, resp. rate 16, last menstrual period 07/01/2016.  Physical Exam  Constitutional: [name], place, and time. [name]. No distress.  HENT:  Head: Normocephalic.  Respiratory: Effort normal.  Musculoskeletal: Normal range of motion.  Neurological: [name], place, and time.  Skin: Skin is warm. She is not diaphoretic.  Psychiatric: Her behavior is normal.    MAU Course  Procedures  None  MDM  Urine without signs of constipation Phenergan 25 mg PO   Assessment and Plan   A:  1. Nausea and vomiting in pregnancy     P:  Discharge home in stable condition  Rx: Zantac, Phenergan @ bedtime PRN, Reglan Return to MAU if symptoms worsen Start prenatal care Small, frequent meals.    Duane LopeJennifer I Aslin Farinas, NP 08/15/2016 2:03 PM

## 2016-08-15 NOTE — MAU Note (Signed)
C/o N&V for past 2 weeks; c/o dizziness for past week; has missed a lot of work; doesn't have a OB yet;

## 2016-08-15 NOTE — Discharge Instructions (Signed)
Nausea and Vomiting, Adult Introduction Feeling sick to your stomach (nausea) means that your stomach is upset or you feel like you have to throw up (vomit). Feeling more and more sick to your stomach can lead to throwing up. Throwing up happens when food and liquid from your stomach are thrown up and out the mouth. Throwing up can make you feel weak and cause you to get dehydrated. Dehydration can make you tired and thirsty, make you have a dry mouth, and make it so you pee (urinate) less often. Older adults and people with other diseases or a weak defense system (immune system) are at higher risk for dehydration. If you feel sick to your stomach or if you throw up, it is important to follow instructions from your doctor about how to take care of yourself. Follow these instructions at home: Eating and drinking Follow these instructions as told by your doctor:  Take an oral rehydration solution (ORS). This is a drink that is sold at pharmacies and stores.  Drink clear fluids in small amounts as you are able, such as:  Water.  Ice chips.  Diluted fruit juice.  Low-calorie sports drinks.  Eat bland, easy-to-digest foods in small amounts as you are able, such as:  Bananas.  Applesauce.  Rice.  Low-fat (lean) meats.  Toast.  Crackers.  Avoid fluids that have a lot of sugar or caffeine in them.  Avoid alcohol.  Avoid spicy or fatty foods. General instructions  Drink enough fluid to keep your pee (urine) clear or pale yellow.  Wash your hands often. If you cannot use soap and water, use hand sanitizer.  Make sure that all people in your home wash their hands well and often.  Take over-the-counter and prescription medicines only as told by your doctor.  Rest at home while you get better.  Watch your condition for any changes.  Breathe slowly and deeply when you feel sick to your stomach.  Keep all follow-up visits as told by your doctor. This is important. Contact a  doctor if:  You have a fever.  You cannot keep fluids down.  Your symptoms get worse.  You have new symptoms.  You feel sick to your stomach for more than two days.  You feel light-headed or dizzy.  You have a headache.  You have muscle cramps. Get help right away if:  You have pain in your chest, neck, arm, or jaw.  You feel very weak or you pass out (faint).  You throw up again and again.  You see blood in your throw-up.  Your throw-up looks like black coffee grounds.  You have bloody or black poop (stools) or poop that look like tar.  You have a very bad headache, a stiff neck, or both.  You have a rash.  You have very bad pain, cramping, or bloating in your belly (abdomen).  You have trouble breathing.  You are breathing very quickly.  Your heart is beating very quickly.  Your skin feels cold and clammy.  You feel confused.  You have pain when you pee.  You have signs of dehydration, such as:  Dark pee, hardly any pee, or no pee.  Cracked lips.  Dry mouth.  Sunken eyes.  Sleepiness.  Weakness. These symptoms may be an emergency. Do not wait to see if the symptoms will go away. Get medical help right away. Call your local emergency services (911 in the [place] Do not drive yourself to the hospital.  This information   is not intended to replace advice given to you by your health care provider. Make sure you discuss any questions you have with your health care provider. Document Released: 02/27/2008 Document Revised: 03/30/2016 Document Reviewed: 05/17/2015  2017 Elsevier  

## 2016-08-20 ENCOUNTER — Ambulatory Visit (HOSPITAL_COMMUNITY)
Admission: RE | Admit: 2016-08-20 | Discharge: 2016-08-20 | Disposition: A | Discharge status: Home / Self Care | Payer: Medicaid Other | Source: Ambulatory Visit | Attending: Obstetrics and Gynecology | Admitting: Obstetrics and Gynecology

## 2016-08-20 DIAGNOSIS — O3680X0 Pregnancy with inconclusive fetal viability, not applicable or unspecified: Secondary | ICD-10-CM | POA: Diagnosis present

## 2016-08-20 DIAGNOSIS — Z3A01 Less than 8 weeks gestation of pregnancy: Secondary | ICD-10-CM | POA: Insufficient documentation

## 2016-09-09 ENCOUNTER — Encounter (HOSPITAL_COMMUNITY): Payer: Self-pay | Admitting: *Deleted

## 2016-09-09 ENCOUNTER — Inpatient Hospital Stay (HOSPITAL_COMMUNITY)
Admission: AD | Admit: 2016-09-09 | Discharge: 2016-09-09 | Disposition: A | Discharge status: Home / Self Care | Payer: Medicaid Other | Source: Ambulatory Visit | Attending: Obstetrics and Gynecology | Admitting: Obstetrics and Gynecology

## 2016-09-09 DIAGNOSIS — N760 Acute vaginitis: Secondary | ICD-10-CM | POA: Insufficient documentation

## 2016-09-09 DIAGNOSIS — Z8249 Family history of ischemic heart disease and other diseases of the circulatory system: Secondary | ICD-10-CM | POA: Diagnosis not present

## 2016-09-09 DIAGNOSIS — O26891 Other specified pregnancy related conditions, first trimester: Secondary | ICD-10-CM | POA: Diagnosis present

## 2016-09-09 DIAGNOSIS — Z87891 Personal history of nicotine dependence: Secondary | ICD-10-CM | POA: Insufficient documentation

## 2016-09-09 DIAGNOSIS — B9689 Other specified bacterial agents as the cause of diseases classified elsewhere: Secondary | ICD-10-CM

## 2016-09-09 DIAGNOSIS — O23591 Infection of other part of genital tract in pregnancy, first trimester: Secondary | ICD-10-CM | POA: Insufficient documentation

## 2016-09-09 DIAGNOSIS — Z3A10 10 weeks gestation of pregnancy: Secondary | ICD-10-CM | POA: Diagnosis not present

## 2016-09-09 LAB — URINALYSIS, ROUTINE W REFLEX MICROSCOPIC
Bacteria, UA: NONE SEEN
Bilirubin Urine: NEGATIVE
GLUCOSE, UA: NEGATIVE mg/dL
HGB URINE DIPSTICK: NEGATIVE
KETONES UR: NEGATIVE mg/dL
NITRITE: NEGATIVE
PH: 6 (ref 5.0–8.0)
PROTEIN: NEGATIVE mg/dL
Specific Gravity, Urine: 1.014 (ref 1.005–1.030)

## 2016-09-09 LAB — WET PREP, GENITAL
SPERM: NONE SEEN
TRICH WET PREP: NONE SEEN
Yeast Wet Prep HPF POC: NONE SEEN

## 2016-09-09 MED ORDER — METRONIDAZOLE 500 MG PO TABS: 500.0000 mg | ORAL_TABLET | Freq: Two times a day (BID) | ORAL | 0 refills | Status: DC

## 2016-09-09 NOTE — MAU Note (Signed)
Having vaginal irritation.  Thinks it might be BV. Knows that now she is pregnant, she just can't take anything.  Wants to see what is going on. Having vag itching. This all started about 2 wks ago

## 2016-09-09 NOTE — Discharge Instructions (Signed)

## 2016-09-09 NOTE — MAU Provider Note (Signed)
History   G1 @ 10 wks in with vag discharge and itching for several weeks. Denies any othetr complaints.   CSN: [id number]  Arrival date & time 09/09/16  1524   None     Chief Complaint  Patient presents with  . vag irritation    HPI  Past Medical History:  Diagnosis Date  . Bronchitis   . Headache     Past Surgical History:  Procedure Laterality Date  . NO PAST SURGERIES      Family History  Problem Relation Age of Onset  . Hypertension Maternal Grandmother   . Heart disease Maternal Grandmother     Social History  Substance Use Topics  . Smoking status: Former Smoker    Packs/day: 0.25    Years: 1.00    Types: Cigarettes    Quit date: 04/14/2015  . Smokeless tobacco: Former NeurosurgeonUser  . Alcohol use Yes     Comment: occ    OB History    Gravida Para Term Preterm AB Living   1             SAB TAB Ectopic Multiple Live Births                  Review of Systems  Constitutional: Negative.   HENT: Negative.   Eyes: Negative.   Respiratory: Negative.   Cardiovascular: Negative.   Gastrointestinal: Negative.   Endocrine: Negative.   Genitourinary: Positive for vaginal discharge.  Musculoskeletal: Negative.   Skin: Negative.   Allergic/Immunologic: Negative.   Neurological: Negative.   Hematological: Negative.   Psychiatric/Behavioral: Negative.     Allergies  Shellfish allergy  Home Medications    BP 126/61 (BP Location: Right Arm)   Pulse 84   Temp 98.5 F (36.9 C) (Oral)   Resp 16   Wt 215 lb 6.4 oz (97.7 kg)   LMP 07/01/2016 (Exact Date)   BMI 36.97 kg/m   Physical Exam  Constitutional: [name], place, and time. [name].  HENT:  Head: Normocephalic.  Eyes: Pupils are equal, round, and reactive to light.  Neck: Normal range of motion.  Cardiovascular: Normal rate, regular rhythm, normal heart sounds and intact distal pulses.   Pulmonary/Chest: Effort normal and breath sounds  normal.  Abdominal: Soft. Bowel sounds are normal.  Genitourinary: Vagina normal and uterus normal.  Musculoskeletal: Normal range of motion.  Neurological: [name], place, and time. She has normal reflexes.  Skin: Skin is warm and dry.  Psychiatric: She has a normal mood and affect. Her behavior is normal. Judgment and thought content normal.    MAU Course  Procedures (including critical care time)  Labs Reviewed  URINALYSIS, ROUTINE W REFLEX MICROSCOPIC - Abnormal; Notable for the following:       Result Value   APPearance HAZY (*)    Leukocytes, UA TRACE (*)    Squamous Epithelial / LPF 0-5 (*)    All other components within normal limits  WET PREP, GENITAL  HIV ANTIBODY (ROUTINE TESTING)  GC/CHLAMYDIA PROBE AMP ([place]) NOT AT [facility]   No results found.   No diagnosis found. Dx: BV   MDM  Wet prep shows pos clue. Will treat foe BV and d/c home.

## 2016-09-10 LAB — HIV ANTIBODY (ROUTINE TESTING W REFLEX): HIV Screen 4th Generation wRfx: NONREACTIVE

## 2016-09-10 LAB — GC/CHLAMYDIA PROBE AMP (~~LOC~~) NOT AT ARMC
CHLAMYDIA, DNA PROBE: NEGATIVE
NEISSERIA GONORRHEA: NEGATIVE

## 2016-09-24 NOTE — L&D Delivery Note (Signed)
Delivery Note At 12:20 AM a viable female was delivered via Vaginal, Spontaneous Delivery (Presentation: OA).  APGAR: 7, 9; weight pending.   Placenta status: delivered intact. Cord:  3V with the following complications: anterior shoulder delivered w/o difficulty then delivery of posterior shoulder after 1 minute aided by increased maternal pushing effort and [place]ts. Cord pH: n/a  Anesthesia: epidural Episiotomy: None Lacerations: none Est. Blood Loss (mL): 200  Mom to postpartum.  Baby to Nursery.  Lori Short, CNM 03/27/2017, 12:36 AM

## 2016-10-13 ENCOUNTER — Encounter (HOSPITAL_COMMUNITY): Payer: Self-pay

## 2016-10-13 ENCOUNTER — Inpatient Hospital Stay (HOSPITAL_COMMUNITY)
Admission: AD | Admit: 2016-10-13 | Discharge: 2016-10-13 | Disposition: A | Discharge status: Home / Self Care | Payer: Medicaid Other | Source: Ambulatory Visit | Attending: Obstetrics and Gynecology | Admitting: Obstetrics and Gynecology

## 2016-10-13 DIAGNOSIS — Z91013 Allergy to seafood: Secondary | ICD-10-CM | POA: Diagnosis not present

## 2016-10-13 DIAGNOSIS — Z3A14 14 weeks gestation of pregnancy: Secondary | ICD-10-CM | POA: Insufficient documentation

## 2016-10-13 DIAGNOSIS — Z87891 Personal history of nicotine dependence: Secondary | ICD-10-CM | POA: Diagnosis not present

## 2016-10-13 DIAGNOSIS — O209 Hemorrhage in early pregnancy, unspecified: Secondary | ICD-10-CM | POA: Insufficient documentation

## 2016-10-13 LAB — URINALYSIS, ROUTINE W REFLEX MICROSCOPIC
Bilirubin Urine: NEGATIVE
Glucose, UA: NEGATIVE mg/dL
Ketones, ur: NEGATIVE mg/dL
NITRITE: NEGATIVE
PH: 5 (ref 5.0–8.0)
Protein, ur: 30 mg/dL — AB
SPECIFIC GRAVITY, URINE: 1.025 (ref 1.005–1.030)

## 2016-10-13 NOTE — MAU Note (Signed)
Pt to MAU with c/o vaginal bleeding

## 2016-10-13 NOTE — Discharge Instructions (Signed)
Vaginal Bleeding During Pregnancy, First Trimester °A small amount of bleeding (spotting) from the vagina is common in early pregnancy. Sometimes the bleeding is normal and is not a problem, and sometimes it is a sign of something serious. Be sure to tell your doctor about any bleeding from your vagina right away. °Follow these instructions at home: °· Watch your condition for any changes. °· Follow your doctor's instructions about how active you can be. °· If you are on bed rest: °¨ You may need to stay in bed and only get up to use the bathroom. °¨ You may be allowed to do some activities. °¨ If you need help, make plans for someone to help you. °· Write down: °¨ The number of pads you use each day. °¨ How often you change pads. °¨ How soaked (saturated) your pads are. °· Do not use tampons. °· Do not douche. °· Do not have sex or orgasms until your doctor says it is okay. °· If you pass any tissue from your vagina, save the tissue so you can show it to your doctor. °· Only take medicines as told by your doctor. °· Do not take aspirin because it can make you bleed. °· Keep all follow-up visits as told by your doctor. °Contact a doctor if: °· You bleed from your vagina. °· You have cramps. °· You have labor pains. °· You have a fever that does not go away after you take medicine. °Get help right away if: °· You have very bad cramps in your back or belly (abdomen). °· You pass large clots or tissue from your vagina. °· You bleed more. °· You feel light-headed or weak. °· You pass out (faint). °· You have chills. °· You are leaking fluid or have a gush of fluid from your vagina. °· You pass out while pooping (having a bowel movement). °This information is not intended to replace advice given to you by your health care provider. Make sure you discuss any questions you have with your health care provider. °Document Released: 01/25/2014 Document Revised: 02/16/2016 Document Reviewed: 05/18/2013 °Elsevier Interactive  Patient Education © 2017 [employer] ° °

## 2016-10-13 NOTE — MAU Provider Note (Signed)
Chief Complaint: No chief complaint on file.   First Provider Initiated Contact with Patient 10/13/16 0945        SUBJECTIVE HPI: Lori Short is a 21 y.o. G1P0 at 727w6d by LMP who presents to maternity admissions reporting bleeding "like a period".  When I asked her about the bleeding she tells me it is just a little pink discharge only when she wipes. Does not require a pad. Denies recent intercourse. No pain. . She denies vaginal itching/burning, urinary symptoms, h/a, dizziness, n/v, or fever/chills.    Vaginal Bleeding  The patient's primary symptoms include vaginal bleeding. The patient's pertinent negatives include no genital itching, genital lesions, genital odor, pelvic pain or vaginal discharge. This is a new problem. The current episode started yesterday. The problem occurs intermittently. The problem has been unchanged. The patient is experiencing no pain. She is pregnant. Pertinent negatives include no abdominal pain, back pain, chills, constipation, diarrhea, fever, headaches, nausea or vomiting. The vaginal discharge was bloody. The vaginal bleeding is spotting. She has not been passing clots. She has not been passing tissue. Nothing aggravates the symptoms. She has tried nothing for the symptoms.    RN Note: Pt to MAU with c/o vaginal bleeding  Past Medical History:  Diagnosis Date  . Bronchitis   . Headache    Past Surgical History:  Procedure Laterality Date  . NO PAST SURGERIES     Social History   Social History  . Marital status: Single    Spouse name: N/A  . Number of children: N/A  . Years of education: N/A   Occupational History  . unemployed    Social History Main Topics  . Smoking status: Former Smoker    Packs/day: 0.25    Years: 1.00    Types: Cigarettes    Quit date: 04/14/2015  . Smokeless tobacco: Former NeurosurgeonUser  . Alcohol use Yes     Comment: occ  . Drug use: No     Comment: occ, last used May 2016  . Sexual activity: Yes    Partners:  Male    Birth control/ protection: None     Comment: implant was to be removed in May   Other Topics Concern  . Not on file   Social History Narrative  . No narrative on file   No current facility-administered medications on file prior to encounter.    Current Outpatient Prescriptions on File Prior to Encounter  Medication Sig Dispense Refill  . acetaminophen (TYLENOL) 500 MG tablet Take 1,000 mg by mouth every 6 (six) hours as needed for mild pain, moderate pain or headache.    . Prenatal MV-Min-FA-Omega-3 (PRENATAL GUMMIES/DHA & FA) 0.4-32.5 MG CHEW Chew 2 each by mouth daily.    . metoCLOPramide (REGLAN) 10 MG tablet Take 1 tablet (10 mg total) by mouth 3 (three) times daily before meals. (Patient not taking: Reported on 10/13/2016) 60 tablet 1  . metroNIDAZOLE (FLAGYL) 500 MG tablet Take 1 tablet (500 mg total) by mouth 2 (two) times daily. 20 tablet 0  . promethazine (PHENERGAN) 25 MG tablet Take 1 tablet (25 mg total) by mouth at bedtime as needed for nausea or vomiting. (Patient not taking: Reported on 10/13/2016) 30 tablet 1  . ranitidine (ZANTAC) 150 MG tablet Take 1 tablet (150 mg total) by mouth 2 (two) times daily. Take first thing in the morning with a glass of water and before bed. (Patient not taking: Reported on 10/13/2016) 60 tablet 0   Allergies  Allergen Reactions  . Shellfish Allergy Hives, Nausea And Vomiting and Swelling    Reaction:  Tongue swelling    I have reviewed patient's Past Medical Hx, Surgical Hx, Family Hx, Social Hx, medications and allergies.   ROS:  Review of Systems  Constitutional: Negative for chills and fever.  Gastrointestinal: Negative for abdominal pain, constipation, diarrhea, nausea and vomiting.  Genitourinary: Positive for vaginal bleeding. Negative for pelvic pain and vaginal discharge.  Musculoskeletal: Negative for back pain.  Neurological: Negative for headaches.   Review of Systems  Other systems negative   Physical Exam   Physical Exam Patient Vitals for the past 24 hrs:  BP Temp Temp src Pulse Resp SpO2  10/13/16 0855 112/70 97.5 F (36.4 C) Oral 87 17 100 %   Constitutional: Well-developed, well-nourished female in no acute distress.  Cardiovascular: normal rate Respiratory: normal effort GI: Abd soft, non-tender. Pos BS x 4 MS: Extremities nontender, no edema, normal ROM Neurologic: Alert and oriented x 4.  GU: Neg CVAT.  PELVIC EXAM: Cervix pink, visually closed, without lesion, scant tan/pink creamy discharge, vaginal walls and external genitalia normal Bimanual exam: Cervix 0/long/high, firm, anterior, neg CMT, uterus nontender, nonenlarged, adnexa without tenderness, enlargement, or mass  FHT 145 by doppler  LAB RESULTS Results for orders placed or performed during the hospital encounter of 10/13/16 (from the past 24 hour(s))  Urinalysis, Routine w reflex microscopic     Status: Abnormal   Collection Time: 10/13/16  8:45 AM  Result Value Ref Range   Color, Urine AMBER (A) YELLOW   APPearance HAZY (A) CLEAR   Specific Gravity, Urine 1.025 1.005 - 1.030   pH 5.0 5.0 - 8.0   Glucose, UA NEGATIVE NEGATIVE mg/dL   Hgb urine dipstick LARGE (A) NEGATIVE   Bilirubin Urine NEGATIVE NEGATIVE   Ketones, ur NEGATIVE NEGATIVE mg/dL   Protein, ur 30 (A) NEGATIVE mg/dL   Nitrite NEGATIVE NEGATIVE   Leukocytes, UA TRACE (A) NEGATIVE   RBC / HPF TOO NUMEROUS TO COUNT 0 - 5 RBC/hpf   WBC, UA 6-30 0 - 5 WBC/hpf   Bacteria, UA RARE (A) NONE SEEN   Squamous Epithelial / LPF 0-5 (A) NONE SEEN   Mucous PRESENT        IMAGING No results found.  MAU Management/MDM: Chart reviewed.  Already had cultures done in December so deferred today Patient states she just wanted to make sure the baby was ok Had RN auscultate FHR twice so FOB could hear it.     ASSESSMENT SIUP at [redacted]w[redacted]d Bleeding in early pregnancy, unclear source  PLAN Discharge home Reassured pregnancy is progressing well Pelvic rest  until bleeding stops Encouraged to seek prenatal care  Pt stable at time of discharge. Encouraged to return here or to other Urgent Care/ED if she develops worsening of symptoms, increase in pain, fever, or other concerning symptoms.    Wynelle Bourgeois CNM, MSN Certified Nurse-Midwife 10/13/2016  9:46 AM

## 2016-10-23 ENCOUNTER — Ambulatory Visit (INDEPENDENT_AMBULATORY_CARE_PROVIDER_SITE_OTHER): Payer: Medicaid Other | Admitting: Obstetrics & Gynecology

## 2016-10-23 ENCOUNTER — Encounter: Payer: Self-pay | Admitting: Obstetrics & Gynecology

## 2016-10-23 ENCOUNTER — Other Ambulatory Visit (HOSPITAL_COMMUNITY)
Admission: RE | Admit: 2016-10-23 | Discharge: 2016-10-23 | Disposition: A | Discharge status: Home / Self Care | Payer: Medicaid Other | Source: Ambulatory Visit | Attending: Obstetrics & Gynecology | Admitting: Obstetrics & Gynecology

## 2016-10-23 DIAGNOSIS — Z113 Encounter for screening for infections with a predominantly sexual mode of transmission: Secondary | ICD-10-CM | POA: Insufficient documentation

## 2016-10-23 DIAGNOSIS — Z3402 Encounter for supervision of normal first pregnancy, second trimester: Secondary | ICD-10-CM | POA: Diagnosis not present

## 2016-10-23 DIAGNOSIS — Z1151 Encounter for screening for human papillomavirus (HPV): Secondary | ICD-10-CM | POA: Diagnosis present

## 2016-10-23 DIAGNOSIS — Z01411 Encounter for gynecological examination (general) (routine) with abnormal findings: Secondary | ICD-10-CM | POA: Insufficient documentation

## 2016-10-23 DIAGNOSIS — Z3400 Encounter for supervision of normal first pregnancy, unspecified trimester: Secondary | ICD-10-CM

## 2016-10-23 DIAGNOSIS — O0990 Supervision of high risk pregnancy, unspecified, unspecified trimester: Secondary | ICD-10-CM | POA: Insufficient documentation

## 2016-10-23 NOTE — Progress Notes (Signed)
  Subjective:    [name] is a G1P0 472w5d being seen today for her first obstetrical visit.  Her obstetrical history is significant for second trimester bleeding now resolved. Patient does intend to breast feed. Pregnancy history fully reviewed.  Patient reports nausea.  Vitals:   10/23/16 1433  BP: 113/77  Pulse: 98  Temp: 98.2 F (36.8 C)  Weight: 214 lb (97.1 kg)    HISTORY: OB History  Gravida Para Term Preterm AB Living  1            SAB TAB Ectopic Multiple Live Births               # Outcome Date GA Lbr Len/2nd Weight Sex Delivery Anes PTL Lv  1 Current              Past Medical History:  Diagnosis Date  . Bronchitis   . Headache    Past Surgical History:  Procedure Laterality Date  . NO PAST SURGERIES     Family History  Problem Relation Age of Onset  . Hypertension Maternal Grandmother   . Heart disease Maternal Grandmother      Exam    Uterus:     Pelvic Exam:    Perineum: No Hemorrhoids   Vulva: normal   Vagina:  normal mucosa   pH:     Cervix: no lesions and nulliparous appearance   Adnexa: not evaluated   Bony Pelvis: average  System: Breast:  normal appearance, no masses or tenderness   Skin: normal coloration and turgor, no rashes    Neurologic: oriented, normal mood   Extremities: normal strength, tone, and muscle mass   HEENT sclera clear, anicteric, oropharynx clear, no lesions and thyroid without masses   Mouth/Teeth mucous membranes moist, pharynx normal without lesions and dental hygiene good   Neck supple   Cardiovascular: regular rate and rhythm, no murmurs or gallops   Respiratory:  appears well, vitals normal, no respiratory distress, acyanotic, normal RR, neck free of mass or lymphadenopathy, chest clear, no wheezing, crepitations, rhonchi, normal symmetric air entry   Abdomen: soft, non-tender; bowel sounds normal; no masses,  no organomegaly   Urinary: urethral meatus normal      Assessment:    Pregnancy:  G1P0 Patient Active Problem List   Diagnosis Date Noted  . Supervision of normal pregnancy, antepartum 10/23/2016  . Smoker 09/30/2014  . Obesity 09/30/2014        Plan:     Initial labs drawn. Prenatal vitamins. Problem list reviewed and updated. Genetic Screening discussed Quad Screen: ordered.  Ultrasound discussed; fetal survey: ordered.  Follow up in 4 weeks. 50% of 30 min visit spent on counseling and coordination of care.  HIV and GC CT negative last month   Scheryl DarterJames Annastacia Duba 10/23/2016

## 2016-10-23 NOTE — Progress Notes (Signed)
Patient is in the office, states that she thinks she feels fetal flutters, patient reported bleeding 2 weeks ago, but none since.

## 2016-10-23 NOTE — Patient Instructions (Signed)
Second Trimester of Pregnancy The second trimester is from week 13 through week 28 (months 4 through 6). The second trimester is often a time when you feel your best. Your body has also adjusted to being pregnant, and you begin to feel better physically. Usually, morning sickness has lessened or quit completely, you may have more energy, and you may have an increase in appetite. The second trimester is also a time when the fetus is growing rapidly. At the end of the sixth month, the fetus is about 9 inches long and weighs about 1 pounds. You will likely begin to feel the baby move (quickening) between 18 and 20 weeks of the pregnancy. Body changes during your second trimester Your body continues to go through many changes during your second trimester. The changes vary from woman to woman.  Your weight will continue to increase. You will notice your lower abdomen bulging out.  You may begin to get stretch marks on your hips, abdomen, and breasts.  You may develop headaches that can be relieved by medicines. The medicines should be approved by your health care provider.  You may urinate more often because the fetus is pressing on your bladder.  You may develop or continue to have heartburn as a result of your pregnancy.  You may develop constipation because certain hormones are causing the muscles that push waste through your intestines to slow down.  You may develop hemorrhoids or swollen, bulging veins (varicose veins).  You may have back pain. This is caused by:  Weight gain.  Pregnancy hormones that are relaxing the joints in your pelvis.  A shift in weight and the muscles that support your balance.  Your breasts will continue to grow and they will continue to become tender.  Your gums may bleed and may be sensitive to brushing and flossing.  Dark spots or blotches (chloasma, mask of pregnancy) may develop on your face. This will likely fade after the baby is born.  A dark line  from your belly button to the pubic area (linea nigra) may appear. This will likely fade after the baby is born.  You may have changes in your hair. These can include thickening of your hair, rapid growth, and changes in texture. Some women also have hair loss during or after pregnancy, or hair that feels dry or thin. Your hair will most likely return to normal after your baby is born. What to expect at prenatal visits During a routine prenatal visit:  You will be weighed to make sure you and the fetus are growing normally.  Your blood pressure will be taken.  Your abdomen will be measured to track your baby's growth.  The fetal heartbeat will be listened to.  Any test results from the previous visit will be discussed. Your health care provider may ask you:  How you are feeling.  If you are feeling the baby move.  If you have had any abnormal symptoms, such as leaking fluid, bleeding, severe headaches, or abdominal cramping.  If you are using any tobacco products, including cigarettes, chewing tobacco, and electronic cigarettes.  If you have any questions. Other tests that may be performed during your second trimester include:  Blood tests that check for:  Low iron levels (anemia).  Gestational diabetes (between 24 and 28 weeks).  Rh antibodies. This is to check for a protein on red blood cells (Rh factor).  Urine tests to check for infections, diabetes, or protein in the urine.  An ultrasound to   confirm the proper growth and development of the baby.  An amniocentesis to check for possible genetic problems.  Fetal screens for spina bifida and Down syndrome.  HIV (human immunodeficiency virus) testing. Routine prenatal testing includes screening for HIV, unless you choose not to have this test. Follow these instructions at home: Eating and drinking  Continue to eat regular, healthy meals.  Avoid raw meat, uncooked cheese, cat litter boxes, and soil used by cats. These  carry germs that can cause birth defects in the baby.  Take your prenatal vitamins.  Take 1500-2000 mg of calcium daily starting at the 20th week of pregnancy until you deliver your baby.  If you develop constipation:  Take over-the-counter or prescription medicines.  Drink enough fluid to keep your urine clear or pale yellow.  Eat foods that are high in fiber, such as fresh fruits and vegetables, whole grains, and beans.  Limit foods that are high in fat and processed sugars, such as fried and sweet foods. Activity  Exercise only as directed by your health care provider. Experiencing uterine cramps is a good sign to stop exercising.  Avoid heavy lifting, wear low heel shoes, and practice good posture.  Wear your seat belt at all times when driving.  Rest with your legs elevated if you have leg cramps or low back pain.  Wear a good support bra for breast tenderness.  Do not use hot tubs, steam rooms, or saunas. Lifestyle  Avoid all smoking, herbs, alcohol, and unprescribed drugs. These chemicals affect the formation and growth of the baby.  Do not use any products that contain nicotine or tobacco, such as cigarettes and e-cigarettes. If you need help quitting, ask your health care provider.  A sexual relationship may be continued unless your health care provider directs you otherwise. General instructions  Follow your health care provider's instructions regarding medicine use. There are medicines that are either safe or unsafe to take during pregnancy.  Take warm sitz baths to soothe any pain or discomfort caused by hemorrhoids. Use hemorrhoid cream if your health care provider approves.  If you develop varicose veins, wear support hose. Elevate your feet for 15 minutes, 3-4 times a day. Limit salt in your diet.  Visit your dentist if you have not gone yet during your pregnancy. Use a soft toothbrush to brush your teeth and be gentle when you floss.  Keep all follow-up  prenatal visits as told by your health care provider. This is important. Contact a health care provider if:  You have dizziness.  You have mild pelvic cramps, pelvic pressure, or nagging pain in the abdominal area.  You have persistent nausea, vomiting, or diarrhea.  You have a bad smelling vaginal discharge.  You have pain with urination. Get help right away if:  You have a fever.  You are leaking fluid from your vagina.  You have spotting or bleeding from your vagina.  You have severe abdominal cramping or pain.  You have rapid weight gain or weight loss.  You have shortness of breath with chest pain.  You notice sudden or extreme swelling of your face, hands, ankles, feet, or legs.  You have not felt your baby move in over an hour.  You have severe headaches that do not go away with medicine.  You have vision changes. Summary  The second trimester is from week 13 through week 28 (months 4 through 6). It is also a time when the fetus is growing rapidly.  Your body goes   through many changes during pregnancy. The changes vary from woman to woman.  Avoid all smoking, herbs, alcohol, and unprescribed drugs. These chemicals affect the formation and growth your baby.  Do not use any tobacco products, such as cigarettes, chewing tobacco, and e-cigarettes. If you need help quitting, ask your health care provider.  Contact your health care provider if you have any questions. Keep all prenatal visits as told by your health care provider. This is important. This information is not intended to replace advice given to you by your health care provider. Make sure you discuss any questions you have with your health care provider. Document Released: 09/04/2001 Document Revised: 02/16/2016 Document Reviewed: 11/11/2012 Elsevier Interactive Patient Education  2017 [employer]vier Inc.  

## 2016-10-24 LAB — GC/CHLAMYDIA PROBE AMP (~~LOC~~) NOT AT ARMC
CHLAMYDIA, DNA PROBE: NEGATIVE
Neisseria Gonorrhea: NEGATIVE

## 2016-10-26 LAB — CYTOLOGY - PAP
Diagnosis: UNDETERMINED — AB
HPV (WINDOPATH): DETECTED — AB

## 2016-10-26 LAB — CULTURE, OB URINE

## 2016-10-26 LAB — URINE CULTURE, OB REFLEX

## 2016-10-27 LAB — TOXASSURE SELECT 13 (MW), URINE

## 2016-10-30 ENCOUNTER — Other Ambulatory Visit: Payer: Self-pay

## 2016-10-30 DIAGNOSIS — O2342 Unspecified infection of urinary tract in pregnancy, second trimester: Secondary | ICD-10-CM

## 2016-10-30 LAB — AFP, QUAD SCREEN
DIA MOM VALUE: 1.14
DIA Value (EIA): 163.31 pg/mL
DSR (By Age)    1 IN: 1119
DSR (Second Trimester) 1 IN: 7928
GESTATIONAL AGE AFP: 16.7 wk
MATERNAL AGE AT EDD: 22.4 a
MSAFP Mom: 1.06
MSAFP: 33.7 ng/mL
MSHCG MOM: 0.96
MSHCG: 25674 m[IU]/mL
Osb Risk: 10000
T18 (By Age): 1:4359 {titer}
Test Results:: NEGATIVE
UE3 MOM: 1.3
UE3 VALUE: 1.16 ng/mL
Weight: 214 [lb_av]

## 2016-10-30 LAB — PRENATAL PROFILE I(LABCORP)
ANTIBODY SCREEN: NEGATIVE
Basophils Absolute: 0 10*3/uL (ref 0.0–0.2)
Basos: 0 %
EOS (ABSOLUTE): 0.2 10*3/uL (ref 0.0–0.4)
EOS: 2 %
HEP B S AG: NEGATIVE
Hematocrit: 34.3 % (ref 34.0–46.6)
Hemoglobin: 10.8 g/dL — ABNORMAL LOW (ref 11.1–15.9)
Immature Grans (Abs): 0 10*3/uL (ref 0.0–0.1)
Immature Granulocytes: 0 %
LYMPHS ABS: 2.2 10*3/uL (ref 0.7–3.1)
Lymphs: 19 %
MCH: 23.7 pg — AB (ref 26.6–33.0)
MCHC: 31.5 g/dL (ref 31.5–35.7)
MCV: 75 fL — AB (ref 79–97)
Monocytes Absolute: 0.9 10*3/uL (ref 0.1–0.9)
Monocytes: 8 %
NEUTROS ABS: 8.1 10*3/uL — AB (ref 1.4–7.0)
Neutrophils: 71 %
Platelets: 464 10*3/uL — ABNORMAL HIGH (ref 150–379)
RBC: 4.55 x10E6/uL (ref 3.77–5.28)
RDW: 17 % — ABNORMAL HIGH (ref 12.3–15.4)
RH TYPE: POSITIVE
RPR: NONREACTIVE
Rubella Antibodies, IGG: <0.9 index — ABNORMAL LOW (ref >0.99)
WBC: 11.5 10*3/uL — AB (ref 3.4–10.8)

## 2016-10-30 LAB — HEMOGLOBINOPATHY EVALUATION
HEMOGLOBIN A2 QUANTITATION: 2.2 % (ref 1.8–3.2)
HEMOGLOBIN F QUANTITATION: 0 % (ref 0.0–2.0)
HGB A: 97.8 % (ref 96.4–98.8)
HGB C: 0 %
HGB S: 0 %
HGB VARIANT: 0 %

## 2016-10-30 LAB — VARICELLA ZOSTER ANTIBODY, IGG: Varicella zoster IgG: <135 index — ABNORMAL LOW (ref >165)

## 2016-10-30 MED ORDER — CEPHALEXIN 500 MG PO CAPS: 500.0000 mg | ORAL_CAPSULE | Freq: Two times a day (BID) | ORAL | 0 refills | Status: DC

## 2016-10-30 NOTE — Progress Notes (Unsigned)
.  ke

## 2016-11-08 ENCOUNTER — Ambulatory Visit (HOSPITAL_COMMUNITY)
Admission: RE | Admit: 2016-11-08 | Discharge: 2016-11-08 | Disposition: A | Discharge status: Home / Self Care | Payer: Medicaid Other | Source: Ambulatory Visit | Attending: Obstetrics & Gynecology | Admitting: Obstetrics & Gynecology

## 2016-11-08 ENCOUNTER — Other Ambulatory Visit: Payer: Self-pay | Admitting: Obstetrics & Gynecology

## 2016-11-08 DIAGNOSIS — Z3400 Encounter for supervision of normal first pregnancy, unspecified trimester: Secondary | ICD-10-CM

## 2016-11-08 DIAGNOSIS — O358XX0 Maternal care for other (suspected) fetal abnormality and damage, not applicable or unspecified: Secondary | ICD-10-CM

## 2016-11-08 DIAGNOSIS — Z3A18 18 weeks gestation of pregnancy: Secondary | ICD-10-CM | POA: Insufficient documentation

## 2016-11-08 DIAGNOSIS — Z3689 Encounter for other specified antenatal screening: Secondary | ICD-10-CM | POA: Insufficient documentation

## 2016-11-08 DIAGNOSIS — IMO0001 Reserved for inherently not codable concepts without codable children: Secondary | ICD-10-CM

## 2016-11-09 ENCOUNTER — Other Ambulatory Visit (HOSPITAL_COMMUNITY): Payer: Self-pay | Admitting: *Deleted

## 2016-11-09 DIAGNOSIS — O358XX0 Maternal care for other (suspected) fetal abnormality and damage, not applicable or unspecified: Secondary | ICD-10-CM

## 2016-11-09 DIAGNOSIS — O35EXX0 Maternal care for other (suspected) fetal abnormality and damage, fetal genitourinary anomalies, not applicable or unspecified: Secondary | ICD-10-CM

## 2016-11-14 DIAGNOSIS — IMO0002 Reserved for concepts with insufficient information to code with codable children: Secondary | ICD-10-CM | POA: Insufficient documentation

## 2016-11-20 ENCOUNTER — Ambulatory Visit (INDEPENDENT_AMBULATORY_CARE_PROVIDER_SITE_OTHER): Payer: Medicaid Other | Admitting: Obstetrics & Gynecology

## 2016-11-20 VITALS — BP 106/71 | HR 86 | Wt 214.0 lb

## 2016-11-20 DIAGNOSIS — Z283 Underimmunization status: Secondary | ICD-10-CM | POA: Insufficient documentation

## 2016-11-20 DIAGNOSIS — O358XX0 Maternal care for other (suspected) fetal abnormality and damage, not applicable or unspecified: Principal | ICD-10-CM

## 2016-11-20 DIAGNOSIS — O9989 Other specified diseases and conditions complicating pregnancy, childbirth and the puerperium: Secondary | ICD-10-CM

## 2016-11-20 DIAGNOSIS — E669 Obesity, unspecified: Secondary | ICD-10-CM

## 2016-11-20 DIAGNOSIS — O99212 Obesity complicating pregnancy, second trimester: Secondary | ICD-10-CM

## 2016-11-20 DIAGNOSIS — O99322 Drug use complicating pregnancy, second trimester: Secondary | ICD-10-CM

## 2016-11-20 DIAGNOSIS — O99320 Drug use complicating pregnancy, unspecified trimester: Secondary | ICD-10-CM | POA: Insufficient documentation

## 2016-11-20 DIAGNOSIS — F1910 Other psychoactive substance abuse, uncomplicated: Secondary | ICD-10-CM | POA: Diagnosis not present

## 2016-11-20 DIAGNOSIS — Z2839 Other underimmunization status: Secondary | ICD-10-CM | POA: Insufficient documentation

## 2016-11-20 DIAGNOSIS — IMO0001 Reserved for inherently not codable concepts without codable children: Secondary | ICD-10-CM

## 2016-11-20 DIAGNOSIS — Z3400 Encounter for supervision of normal first pregnancy, unspecified trimester: Secondary | ICD-10-CM

## 2016-11-20 DIAGNOSIS — O09899 Supervision of other high risk pregnancies, unspecified trimester: Secondary | ICD-10-CM

## 2016-11-20 DIAGNOSIS — O99210 Obesity complicating pregnancy, unspecified trimester: Secondary | ICD-10-CM

## 2016-11-20 NOTE — Patient Instructions (Signed)

## 2016-11-20 NOTE — Progress Notes (Addendum)
   PRENATAL VISIT NOTE  Subjective:  Lori Short is a 21 y.o. G1P0 at 86w5dbeing seen today for ongoing prenatal care.  She is currently monitored for the following issues for this high-risk pregnancy and has Smoker; Maternal obesity, antepartum; Supervision of normal pregnancy, antepartum; Fetal renal anomaly; and Rubella non-immune status, antepartum on her problem list.  Patient reports yeast infxn after atbx tx. Used Monistat.  Sx improved but, still have itching.  Ok to use 3 add'l day so f Monistat..  Contractions: Irritability. Vag. Bleeding: None.  Movement: Present. Denies leaking of fluid.   The following portions of the patient's history were reviewed and updated as appropriate: allergies, current medications, past family history, past medical history, past social history, past surgical history and problem list. Problem list updated.  Objective:   Vitals:   11/20/16 1049  BP: 106/71  Pulse: 86  Weight: 214 lb (97.1 kg)    Fetal Status:     Movement: Present     General:  Alert, oriented and cooperative. Patient is in no acute distress.  Skin: Skin is warm and dry. No rash noted.   Cardiovascular: Normal heart rate noted  Respiratory: Normal respiratory effort, no problems with respiration noted  Abdomen: Soft, gravid, appropriate for gestational age. Pain/Pressure: Absent     Pelvic:  Cervical exam deferred        Extremities: Normal range of motion.  Edema: Trace  Mental Status: Normal mood and affect. Normal behavior. Normal judgment and thought content.   Assessment and Plan:  Pregnancy: G1P0 at 232w5d1. Supervision of normal first pregnancy, antepartum S/p tx for UTI with atbx.    2. Rubella non-immune status, antepartum Need MMR PP  3. Anomaly of kidney of fetus, single or unspecified fetus Pt aware.  [place]  4. Maternal obesity, antepartum S<D. suspect due to body habitus appropriate dates on [place] 5. + THC use in  pregnancy. Last use 2-3 weeks prev Reviewed the potential adverse effects fo THC use ante.     Preterm labor symptoms and general obstetric precautions including but not limited to vaginal bleeding, contractions, leaking of fluid and fetal movement were reviewed in detail with the patient. Please refer to After Visit Summary for other counseling recommendations.  Return in about 4 weeks (around 12/18/2016).   CaLavonia DraftsMD

## 2016-11-28 ENCOUNTER — Inpatient Hospital Stay (HOSPITAL_COMMUNITY)
Admission: AD | Admit: 2016-11-28 | Discharge: 2016-11-28 | Disposition: A | Discharge status: Home / Self Care | Payer: Medicaid Other | Source: Ambulatory Visit | Attending: Obstetrics & Gynecology | Admitting: Obstetrics & Gynecology

## 2016-11-28 ENCOUNTER — Encounter (HOSPITAL_COMMUNITY): Payer: Self-pay

## 2016-11-28 DIAGNOSIS — N898 Other specified noninflammatory disorders of vagina: Secondary | ICD-10-CM | POA: Diagnosis present

## 2016-11-28 DIAGNOSIS — K130 Diseases of lips: Secondary | ICD-10-CM | POA: Diagnosis not present

## 2016-11-28 DIAGNOSIS — Z87891 Personal history of nicotine dependence: Secondary | ICD-10-CM | POA: Insufficient documentation

## 2016-11-28 LAB — WET PREP, GENITAL
Clue Cells Wet Prep HPF POC: NONE SEEN
Sperm: NONE SEEN
Trich, Wet Prep: NONE SEEN
Yeast Wet Prep HPF POC: NONE SEEN

## 2016-11-28 LAB — URINALYSIS, ROUTINE W REFLEX MICROSCOPIC
BILIRUBIN URINE: NEGATIVE
Glucose, UA: NEGATIVE mg/dL
HGB URINE DIPSTICK: NEGATIVE
KETONES UR: NEGATIVE mg/dL
NITRITE: NEGATIVE
Protein, ur: NEGATIVE mg/dL
Specific Gravity, Urine: 1.023 (ref 1.005–1.030)
pH: 6 (ref 5.0–8.0)

## 2016-11-28 MED ORDER — MICONAZOLE NITRATE 4 % VA CREA: TOPICAL_CREAM | VAGINAL | 2 refills | Status: DC

## 2016-11-28 NOTE — MAU Note (Signed)
Feels like she may have a UTI, hurts really bad down there.  Is itching, sharp pain at clit. Feeling a lot of pressure down there and in back. Pee looks cloudy

## 2016-11-28 NOTE — Discharge Instructions (Signed)

## 2016-11-28 NOTE — MAU Provider Note (Signed)
History     CSN: [id number]  Arrival date and time: 11/28/16 1131   First Provider Initiated Contact with Patient 11/28/16 1155      Chief Complaint  Patient presents with  . Vaginal Discharge  . Vaginal Itching   Vaginal Discharge  The patient's primary symptoms include genital itching, vaginal bleeding and vaginal discharge. The patient's pertinent negatives include no genital lesions, genital odor, genital rash or pelvic pain. This is a new problem. The current episode started yesterday. The problem occurs constantly. The problem has been unchanged. The pain is moderate. The problem affects both sides. She is pregnant. Pertinent negatives include no abdominal pain, chills, constipation, diarrhea, dysuria, fever, flank pain, nausea or vomiting. The vaginal discharge was thin and white. There has been no bleeding. The symptoms are aggravated by intercourse. She has tried nothing for the symptoms. She is sexually active. No, her partner does not have an STD.    OB History    Gravida Para Term Preterm AB Living   1             SAB TAB Ectopic Multiple Live Births                  Past Medical History:  Diagnosis Date  . Bronchitis   . Headache     Past Surgical History:  Procedure Laterality Date  . NO PAST SURGERIES      Family History  Problem Relation Age of Onset  . Hypertension Maternal Grandmother   . Heart disease Maternal Grandmother     Social History  Substance Use Topics  . Smoking status: Former Smoker    Packs/day: 0.25    Years: 1.00    Types: Cigarettes    Quit date: 04/14/2015  . Smokeless tobacco: Former Neurosurgeon  . Alcohol use Yes     Comment: occ    Allergies:  Allergies  Allergen Reactions  . Shellfish Allergy Hives, Nausea And Vomiting and Swelling    Reaction:  Tongue swelling    Prescriptions Prior to Admission  Medication Sig Dispense Refill Last Dose  . acetaminophen (TYLENOL) 500 MG tablet Take 1,000 mg by mouth every 6 (six) hours  as needed for mild pain, moderate pain or headache.   11/27/2016 at Unknown time  . miconazole (MONISTAT 7) 2 % vaginal cream Place 1 Applicatorful vaginally at bedtime. 3-day treatment   Past Month at Unknown time  . Prenatal MV-Min-FA-Omega-3 (PRENATAL GUMMIES/DHA & FA) 0.4-32.5 MG CHEW Chew 2 each by mouth daily.   Past Week at Unknown time  . cephALEXin (KEFLEX) 500 MG capsule Take 1 capsule (500 mg total) by mouth 2 (two) times daily. (Patient not taking: Reported on 11/28/2016) 14 capsule 0 Completed Course at Unknown time  . promethazine (PHENERGAN) 25 MG tablet Take 1 tablet (25 mg total) by mouth at bedtime as needed for nausea or vomiting. (Patient not taking: Reported on 10/13/2016) 30 tablet 1 Not Taking    Review of Systems  Constitutional: Negative for chills and fever.  HENT: Negative for congestion and rhinorrhea.   Respiratory: Negative for chest tightness and shortness of breath.   Cardiovascular: Negative for palpitations and leg swelling.  Gastrointestinal: Negative for abdominal distention, abdominal pain, constipation, diarrhea, nausea and vomiting.  Genitourinary: Positive for dyspareunia and vaginal discharge. Negative for difficulty urinating, dysuria, flank pain and pelvic pain.  Neurological: Negative for dizziness and weakness.   Physical Exam   Blood pressure 121/63, pulse 105, temperature 97.2 F (36.2 C),  temperature source Oral, resp. rate 16, weight 214 lb 12 oz (97.4 kg), last menstrual period 07/01/2016, SpO2 99 %.  Physical Exam  Constitutional: [name], place, and time. [name].  Cardiovascular: Normal rate and intact distal pulses.   Respiratory: Effort normal. No respiratory distress.  GI: Soft. She exhibits no distension. There is no tenderness. There is no rebound and no guarding.  Genitourinary:  Genitourinary Comments: Thick white substance notes on external labia, minimal discharge, health pink  mucosa, cervix closed visually and digitally. No CMT.  Neurological: [name], place, and time.  Skin: Skin is warm and dry. No erythema.  Psychiatric: She has a normal mood and affect. Her behavior is normal.    MAU Course  Procedures  MDM -wet prep negative -Gc/CT pending    Assessment and Plan  1: intertrigo labialis - miconazole cream prescibed to apply externally.  Ernestina Pennaicholas Schenk 11/28/2016, 1:19 PM

## 2016-11-28 NOTE — MAU Note (Signed)
Pt unable to give urine sample at this time 

## 2016-11-29 ENCOUNTER — Encounter: Payer: Self-pay | Admitting: Student

## 2016-11-29 DIAGNOSIS — R8271 Bacteriuria: Secondary | ICD-10-CM | POA: Insufficient documentation

## 2016-11-29 LAB — CULTURE, OB URINE: Culture: 20000 — AB

## 2016-11-29 LAB — GC/CHLAMYDIA PROBE AMP (~~LOC~~) NOT AT ARMC
CHLAMYDIA, DNA PROBE: NEGATIVE
Neisseria Gonorrhea: NEGATIVE

## 2016-12-03 ENCOUNTER — Telehealth: Payer: Self-pay | Admitting: Obstetrics and Gynecology

## 2016-12-04 MED ORDER — CEPHALEXIN 500 MG PO CAPS: 500.0000 mg | ORAL_CAPSULE | Freq: Three times a day (TID) | ORAL | 0 refills | Status: DC

## 2016-12-04 NOTE — Telephone Encounter (Signed)
Patient with GBS in urine. Keflex called to pharmacy.

## 2016-12-05 NOTE — Telephone Encounter (Signed)
Left detailed message on VM as noted on DPR form. Pt advised to call office if any further questions.

## 2016-12-18 ENCOUNTER — Ambulatory Visit (INDEPENDENT_AMBULATORY_CARE_PROVIDER_SITE_OTHER): Payer: Medicaid Other | Admitting: Obstetrics & Gynecology

## 2016-12-18 VITALS — BP 105/71 | HR 90 | Wt 216.0 lb

## 2016-12-18 DIAGNOSIS — Z3400 Encounter for supervision of normal first pregnancy, unspecified trimester: Secondary | ICD-10-CM

## 2016-12-18 DIAGNOSIS — Z3402 Encounter for supervision of normal first pregnancy, second trimester: Secondary | ICD-10-CM | POA: Diagnosis not present

## 2016-12-18 DIAGNOSIS — R8271 Bacteriuria: Secondary | ICD-10-CM

## 2016-12-18 MED ORDER — CEPHALEXIN 500 MG PO CAPS: 500.0000 mg | ORAL_CAPSULE | Freq: Three times a day (TID) | ORAL | 0 refills | Status: DC

## 2016-12-18 NOTE — Progress Notes (Signed)
   PRENATAL VISIT NOTE  Subjective:  Lori Short is a 21 y.o. G1P0 at 3618w5d being seen today for ongoing prenatal care.  She is currently monitored for the following issues for this low-risk pregnancy and has Smoker; Maternal obesity, antepartum; Supervision of normal pregnancy, antepartum; Fetal renal anomaly; Rubella non-immune status, antepartum; Substance abuse affecting pregnancy, antepartum; and GBS bacteriuria on her problem list.  Patient reports no complaints.  Contractions: Not present. Vag. Bleeding: None.  Movement: Present. Denies leaking of fluid.   The following portions of the patient's history were reviewed and updated as appropriate: allergies, current medications, past family history, past medical history, past social history, past surgical history and problem list. Problem list updated.  Objective:   Vitals:   12/18/16 1350  BP: 105/71  Pulse: 90  Weight: 216 lb (98 kg)    Fetal Status: Fetal Heart Rate (bpm): 145   Movement: Present     General:  Alert, oriented and cooperative. Patient is in no acute distress.  Skin: Skin is warm and dry. No rash noted.   Cardiovascular: Normal heart rate noted  Respiratory: Normal respiratory effort, no problems with respiration noted  Abdomen: Soft, gravid, appropriate for gestational age. Pain/Pressure: Absent     Pelvic:  Cervical exam deferred        Extremities: Normal range of motion.     Mental Status: Normal mood and affect. Normal behavior. Normal judgment and thought content.   Assessment and Plan:  Pregnancy: G1P0 at 1418w5d  1. GBS bacteriuria Keflex TID 7 days  2. Supervision of normal first pregnancy, antepartum f/u renal collecting system   Preterm labor symptoms and general obstetric precautions including but not limited to vaginal bleeding, contractions, leaking of fluid and fetal movement were reviewed in detail with the patient. Please refer to After Visit Summary for other counseling  recommendations.  Return in about 4 weeks (around 01/15/2017) for 2 hr GTT.   Adam PhenixJames G Braylyn Kalter, MD

## 2016-12-18 NOTE — Progress Notes (Signed)
Pt states that she did not get Tx for UTI from hospital visit. Keflex was reordered today.

## 2016-12-25 ENCOUNTER — Ambulatory Visit (HOSPITAL_COMMUNITY)
Admission: RE | Admit: 2016-12-25 | Discharge: 2016-12-25 | Disposition: A | Discharge status: Home / Self Care | Payer: Medicaid Other | Source: Ambulatory Visit | Attending: Obstetrics & Gynecology | Admitting: Obstetrics & Gynecology

## 2016-12-25 ENCOUNTER — Encounter (HOSPITAL_COMMUNITY): Payer: Self-pay

## 2016-12-25 DIAGNOSIS — Z3A25 25 weeks gestation of pregnancy: Secondary | ICD-10-CM | POA: Insufficient documentation

## 2016-12-25 DIAGNOSIS — O358XX0 Maternal care for other (suspected) fetal abnormality and damage, not applicable or unspecified: Secondary | ICD-10-CM | POA: Diagnosis not present

## 2016-12-25 DIAGNOSIS — O35EXX0 Maternal care for other (suspected) fetal abnormality and damage, fetal genitourinary anomalies, not applicable or unspecified: Secondary | ICD-10-CM

## 2016-12-26 ENCOUNTER — Other Ambulatory Visit (HOSPITAL_COMMUNITY): Payer: Self-pay | Admitting: *Deleted

## 2016-12-26 DIAGNOSIS — O359XX0 Maternal care for (suspected) fetal abnormality and damage, unspecified, not applicable or unspecified: Secondary | ICD-10-CM

## 2017-01-15 ENCOUNTER — Other Ambulatory Visit: Payer: Medicaid Other

## 2017-01-15 ENCOUNTER — Ambulatory Visit (INDEPENDENT_AMBULATORY_CARE_PROVIDER_SITE_OTHER): Payer: Medicaid Other | Admitting: Obstetrics and Gynecology

## 2017-01-15 VITALS — BP 122/84 | HR 102

## 2017-01-15 DIAGNOSIS — IMO0001 Reserved for inherently not codable concepts without codable children: Secondary | ICD-10-CM

## 2017-01-15 DIAGNOSIS — O358XX0 Maternal care for other (suspected) fetal abnormality and damage, not applicable or unspecified: Secondary | ICD-10-CM

## 2017-01-15 DIAGNOSIS — O0990 Supervision of high risk pregnancy, unspecified, unspecified trimester: Secondary | ICD-10-CM | POA: Diagnosis not present

## 2017-01-15 DIAGNOSIS — R8271 Bacteriuria: Secondary | ICD-10-CM

## 2017-01-15 NOTE — Addendum Note (Signed)
Addended by: Natale Milch D on: 01/15/2017 09:49 AM   Modules accepted: Orders

## 2017-01-15 NOTE — Progress Notes (Signed)
Subjective:  Lori Short is a 21 y.o. G1P0 at [redacted]w[redacted]d being seen today for ongoing prenatal care.  She is currently monitored for the following issues for this high-risk pregnancy and has Smoker; Maternal obesity, antepartum; Supervision of high risk pregnancy, antepartum; Fetal renal anomaly; Rubella non-immune status, antepartum; Substance abuse affecting pregnancy, antepartum; and GBS bacteriuria on her problem list.  Patient reports no complaints.  Contractions: Not present.  .  Movement: Present. Denies leaking of fluid.   The following portions of the patient's history were reviewed and updated as appropriate: allergies, current medications, past family history, past medical history, past social history, past surgical history and problem list. Problem list updated.  Objective:   Vitals:   01/15/17 0858  BP: 122/84  Pulse: (!) 102    Fetal Status:     Movement: Present     General:  Alert, oriented and cooperative. Patient is in no acute distress.  Skin: Skin is warm and dry. No rash noted.   Cardiovascular: Normal heart rate noted  Respiratory: Normal respiratory effort, no problems with respiration noted  Abdomen: Soft, gravid, appropriate for gestational age. Pain/Pressure: Absent     Pelvic:  Cervical exam deferred        Extremities: Normal range of motion.  Edema: Trace  Mental Status: Normal mood and affect. Normal behavior. Normal judgment and thought content.   Urinalysis:      Assessment and Plan:  Pregnancy: G1P0 at [redacted]w[redacted]d  1. GBS bacteriuria Tx in labor  2. Anomaly of kidney of fetus, single or unspecified fetus UTD A2, has Peds Urology appt next month  3. Supervision of high risk pregnancy, antepartum 28 wk labs today  Preterm labor symptoms and general obstetric precautions including but not limited to vaginal bleeding, contractions, leaking of fluid and fetal movement were reviewed in detail with the patient. Please refer to After Visit Summary for other  counseling recommendations.  Return in about 2 weeks (around 01/29/2017) for OB visit.   Hermina Staggers, MD

## 2017-01-16 LAB — CBC
HEMATOCRIT: 29.7 % — AB (ref 34.0–46.6)
HEMOGLOBIN: 9.5 g/dL — AB (ref 11.1–15.9)
MCH: 23.3 pg — ABNORMAL LOW (ref 26.6–33.0)
MCHC: 32 g/dL (ref 31.5–35.7)
MCV: 73 fL — AB (ref 79–97)
PLATELETS: 328 10*3/uL (ref 150–379)
RBC: 4.08 x10E6/uL (ref 3.77–5.28)
RDW: 16 % — ABNORMAL HIGH (ref 12.3–15.4)
WBC: 9.4 10*3/uL (ref 3.4–10.8)

## 2017-01-16 LAB — HIV ANTIBODY (ROUTINE TESTING W REFLEX): HIV Screen 4th Generation wRfx: NONREACTIVE

## 2017-01-16 LAB — RPR: RPR: NONREACTIVE

## 2017-01-16 LAB — GLUCOSE TOLERANCE, 2 HOURS W/ 1HR
Glucose, 1 hour: 112 mg/dL (ref 65–179)
Glucose, 2 hour: 95 mg/dL (ref 65–152)
Glucose, Fasting: 84 mg/dL (ref 65–91)

## 2017-01-22 ENCOUNTER — Ambulatory Visit (HOSPITAL_COMMUNITY)
Admission: RE | Admit: 2017-01-22 | Discharge: 2017-01-22 | Disposition: A | Discharge status: Home / Self Care | Payer: Medicaid Other | Source: Ambulatory Visit | Attending: Obstetrics & Gynecology | Admitting: Obstetrics & Gynecology

## 2017-01-22 ENCOUNTER — Encounter (HOSPITAL_COMMUNITY): Payer: Self-pay

## 2017-01-23 ENCOUNTER — Inpatient Hospital Stay (HOSPITAL_COMMUNITY)
Admission: AD | Admit: 2017-01-23 | Discharge: 2017-01-23 | Disposition: A | Discharge status: Home / Self Care | Payer: Medicaid Other | Source: Ambulatory Visit | Attending: Obstetrics & Gynecology | Admitting: Obstetrics & Gynecology

## 2017-01-23 ENCOUNTER — Encounter (HOSPITAL_COMMUNITY): Payer: Self-pay | Admitting: *Deleted

## 2017-01-23 DIAGNOSIS — O98813 Other maternal infectious and parasitic diseases complicating pregnancy, third trimester: Secondary | ICD-10-CM | POA: Diagnosis not present

## 2017-01-23 DIAGNOSIS — Z87891 Personal history of nicotine dependence: Secondary | ICD-10-CM | POA: Diagnosis not present

## 2017-01-23 DIAGNOSIS — O26893 Other specified pregnancy related conditions, third trimester: Secondary | ICD-10-CM | POA: Insufficient documentation

## 2017-01-23 DIAGNOSIS — O26899 Other specified pregnancy related conditions, unspecified trimester: Secondary | ICD-10-CM

## 2017-01-23 DIAGNOSIS — B379 Candidiasis, unspecified: Secondary | ICD-10-CM | POA: Diagnosis not present

## 2017-01-23 DIAGNOSIS — R109 Unspecified abdominal pain: Secondary | ICD-10-CM | POA: Diagnosis present

## 2017-01-23 DIAGNOSIS — Z3A30 30 weeks gestation of pregnancy: Secondary | ICD-10-CM | POA: Insufficient documentation

## 2017-01-23 DIAGNOSIS — N898 Other specified noninflammatory disorders of vagina: Secondary | ICD-10-CM | POA: Insufficient documentation

## 2017-01-23 DIAGNOSIS — O9989 Other specified diseases and conditions complicating pregnancy, childbirth and the puerperium: Secondary | ICD-10-CM | POA: Diagnosis not present

## 2017-01-23 LAB — URINALYSIS, ROUTINE W REFLEX MICROSCOPIC
BILIRUBIN URINE: NEGATIVE
Glucose, UA: NEGATIVE mg/dL
HGB URINE DIPSTICK: NEGATIVE
KETONES UR: NEGATIVE mg/dL
NITRITE: NEGATIVE
PROTEIN: NEGATIVE mg/dL
Specific Gravity, Urine: 1.013 (ref 1.005–1.030)
pH: 7 (ref 5.0–8.0)

## 2017-01-23 LAB — WET PREP, GENITAL
Clue Cells Wet Prep HPF POC: NONE SEEN
Sperm: NONE SEEN
Trich, Wet Prep: NONE SEEN

## 2017-01-23 MED ORDER — TERCONAZOLE 0.4 % VA CREA: 1.0000 | TOPICAL_CREAM | Freq: Every day | VAGINAL | 0 refills | Status: DC

## 2017-01-23 NOTE — MAU Note (Signed)
c/o lower abdominal cramping since yesterday around 1000; passed some of her mucopus plug yesterday around  0800;

## 2017-01-23 NOTE — MAU Provider Note (Signed)
History     CSN: [id number]  Arrival date and time: 01/23/17 1456   First Provider Initiated Contact with Patient 01/23/17 1523      Chief Complaint  Patient presents with  . Vaginal Discharge  . Abdominal Pain   HPI   Ms.Lori Short is a 21 y.o. female G1P0 @ [redacted]w[redacted]d here in MAU with vaginal discharge.  She says she thinks she passed her mucus plug yesterday morning. She started feeling abdominal cramping that comes and goes. The cramping started shortly after the mucus was noted.  She denies preterm labor history.  She denies vaginal bleeding.     OB History    Gravida Para Term Preterm AB Living   1             SAB TAB Ectopic Multiple Live Births                  Past Medical History:  Diagnosis Date  . Bronchitis   . Headache     Past Surgical History:  Procedure Laterality Date  . NO PAST SURGERIES      Family History  Problem Relation Age of Onset  . Hypertension Maternal Grandmother   . Heart disease Maternal Grandmother     Social History  Substance Use Topics  . Smoking status: Former Smoker    Packs/day: 0.25    Years: 1.00    Types: Cigarettes    Quit date: 04/14/2015  . Smokeless tobacco: Former Neurosurgeon  . Alcohol use Yes     Comment: occ    Allergies:  Allergies  Allergen Reactions  . Shellfish Allergy Hives, Nausea And Vomiting and Swelling    Reaction:  Tongue swelling    Prescriptions Prior to Admission  Medication Sig Dispense Refill Last Dose  . acetaminophen (TYLENOL) 500 MG tablet Take 1,000 mg by mouth every 6 (six) hours as needed for mild pain, moderate pain or headache.   Taking  . cephALEXin (KEFLEX) 500 MG capsule Take 1 capsule (500 mg total) by mouth 3 (three) times daily. 21 capsule 0 Unknown  . MICONAZOLE NITRATE VAGINAL (MICONAZOLE 3) 4 % CREA Apply externally only 1 Tube 2 Taking  . Prenatal MV-Min-FA-Omega-3 (PRENATAL GUMMIES/DHA & FA) 0.4-32.5 MG CHEW Chew 2 each by mouth daily.   Taking   Results for orders  placed or performed during the hospital encounter of 01/23/17 (from the past 48 hour(s))  Urinalysis, Routine w reflex microscopic     Status: Abnormal   Collection Time: 01/23/17  3:00 PM  Result Value Ref Range   Color, Urine YELLOW YELLOW   APPearance CLEAR CLEAR   Specific Gravity, Urine 1.013 1.005 - 1.030   pH 7.0 5.0 - 8.0   Glucose, UA NEGATIVE NEGATIVE mg/dL   Hgb urine dipstick NEGATIVE NEGATIVE   Bilirubin Urine NEGATIVE NEGATIVE   Ketones, ur NEGATIVE NEGATIVE mg/dL   Protein, ur NEGATIVE NEGATIVE mg/dL   Nitrite NEGATIVE NEGATIVE   Leukocytes, UA TRACE (A) NEGATIVE   RBC / HPF 0-5 0 - 5 RBC/hpf   WBC, UA 0-5 0 - 5 WBC/hpf   Bacteria, UA RARE (A) NONE SEEN   Squamous Epithelial / LPF 0-5 (A) NONE SEEN   Mucous PRESENT   Wet prep, genital     Status: Abnormal   Collection Time: 01/23/17  3:30 PM  Result Value Ref Range   Yeast Wet Prep HPF POC PRESENT (A) NONE SEEN   Trich, Wet Prep NONE SEEN NONE SEEN  Clue Cells Wet Prep HPF POC NONE SEEN NONE SEEN   WBC, Wet Prep HPF POC MODERATE (A) NONE SEEN   Sperm NONE SEEN     Review of Systems  Genitourinary: Positive for vaginal discharge. Negative for vaginal bleeding.   Physical Exam   Blood pressure 130/75, pulse 93, temperature 98.5 F (36.9 C), temperature source Oral, resp. rate 16, last menstrual period 07/01/2016.  Physical Exam  Constitutional: [name], place, and time. [name]. No distress.  HENT:  Head: Normocephalic.  Eyes: Pupils are equal, round, and reactive to light.  GI: Soft. She exhibits no distension. There is no tenderness. There is no rebound.  Genitourinary:  Genitourinary Comments: Dilation: Closed Exam by:: Jerrye Bushy NP  Neurological: [name], place, and time.  Skin: Skin is warm. She is not diaphoretic.  Psychiatric: Her behavior is normal.    MAU Course  Procedures  None  MDM  Wet prep &  UA Reactive NST, + accels, no decels, occasional UI, cervix closed. Patient reassured   Assessment and Plan   A:  1. Yeast infection   2. Abdominal cramping affecting pregnancy     P:  Discharge home in stable condition Strict return precautions Rx: Terazol 7 day Follow up with OB as scheduled or sooner if needed   Duane Lope, NP 01/24/2017 1:53 PM

## 2017-01-23 NOTE — Discharge Instructions (Signed)
Abdominal Pain During Pregnancy Belly (abdominal) pain is common during pregnancy. Most of the time, it is not a serious problem. Other times, it can be a sign that something is wrong with the pregnancy. Always tell your doctor if you have belly pain. Follow these instructions at home: Monitor your belly pain for any changes. The following actions may help you feel better:  Do not have sex (intercourse) or put anything in your vagina until you feel better.  Rest until your pain stops.  Drink clear fluids if you feel sick to your stomach (nauseous). Do not eat solid food until you feel better.  Only take medicine as told by your doctor.  Keep all doctor visits as told. Get help right away if:  You are bleeding, leaking fluid, or pieces of tissue come out of your vagina.  You have more pain or cramping.  You keep throwing up (vomiting).  You have pain when you pee (urinate) or have blood in your pee.  You have a fever.  You do not feel your baby moving as much.  You feel very weak or feel like passing out.  You have trouble breathing, with or without belly pain.  You have a very bad headache and belly pain.  You have fluid leaking from your vagina and belly pain.  You keep having watery poop (diarrhea).  Your belly pain does not go away after resting, or the pain gets worse. This information is not intended to replace advice given to you by your health care provider. Make sure you discuss any questions you have with your health care provider. Document Released: 08/29/2009 Document Revised: 04/18/2016 Document Reviewed: 04/09/2013 Elsevier Interactive Patient Education  2017 [employer]  Vaginal Yeast infection, Adult Vaginal yeast infection is a condition that causes soreness, swelling, and redness (inflammation) of the vagina. It also causes vaginal discharge. This is a common condition. Some women get this infection frequently. What are the causes? This condition is  caused by a change in the normal balance of the yeast (candida) and bacteria that live in the vagina. This change causes an overgrowth of yeast, which causes the inflammation. What increases the risk? This condition is more likely to develop in:  Women who take antibiotic medicines.  Women who have diabetes.  Women who take birth control pills.  Women who are pregnant.  Women who douche often.  Women who have a weak defense (immune) system.  Women who have been taking steroid medicines for a long time.  Women who frequently wear tight clothing. What are the signs or symptoms? Symptoms of this condition include:  White, thick vaginal discharge.  Swelling, itching, redness, and irritation of the vagina. The lips of the vagina (vulva) may be affected as well.  Pain or a burning feeling while urinating.  Pain during sex. How is this diagnosed? This condition is diagnosed with a medical history and physical exam. This will include a pelvic exam. Your health care provider will examine a sample of your vaginal discharge under a microscope. Your health care provider may send this sample for testing to confirm the diagnosis. How is this treated? This condition is treated with medicine. Medicines may be over-the-counter or prescription. You may be told to use one or more of the following:  Medicine that is taken orally.  Medicine that is applied as a cream.  Medicine that is inserted directly into the vagina (suppository). Follow these instructions at home:  Take or apply over-the-counter and prescription medicines  only as told by your health care provider.  Do not have sex until your health care provider has approved. Tell your sex partner that you have a yeast infection. That Loh should go to his or her health care provider if he or she develops symptoms.  Do not wear tight clothes, such as pantyhose or tight pants.  Avoid using tampons until your health care provider  approves.  Eat more yogurt. This may help to keep your yeast infection from returning.  Try taking a sitz bath to help with discomfort. This is a warm water bath that is taken while you are sitting down. The water should only come up to your hips and should cover your buttocks. Do this 3-4 times per day or as told by your health care provider.  Do not douche.  Wear breathable, cotton underwear.  If you have diabetes, keep your blood sugar levels under control. Contact a health care provider if:  You have a fever.  Your symptoms go away and then return.  Your symptoms do not get better with treatment.  Your symptoms get worse.  You have new symptoms.  You develop blisters in or around your vagina.  You have blood coming from your vagina and it is not your menstrual period.  You develop pain in your abdomen. This information is not intended to replace advice given to you by your health care provider. Make sure you discuss any questions you have with your health care provider. Document Released: 06/20/2005 Document Revised: 02/22/2016 Document Reviewed: 03/14/2015 Elsevier Interactive Patient Education  2017 [employer].

## 2017-01-30 ENCOUNTER — Encounter: Payer: Self-pay | Admitting: Obstetrics & Gynecology

## 2017-02-11 ENCOUNTER — Telehealth: Payer: Self-pay | Admitting: *Deleted

## 2017-02-11 NOTE — Telephone Encounter (Signed)
Patient is calling with sharp pain. 2:24 Call to patient- she states she is having sharp pain in her back and front. She is having mucus type discharge. When ask- she is not having contractions or any other discharge. She missed her last appointment In the office. She has an appointment with specialist tomorrow. She was encouraged to call to reschedule her appointment in the office. She is not working- but going to school. Encouraged Tylenol and rest for discomfort and she may need a maternity belt for support.

## 2017-02-12 ENCOUNTER — Emergency Department (HOSPITAL_COMMUNITY)
Admission: EM | Admit: 2017-02-12 | Discharge: 2017-02-12 | Disposition: A | Discharge status: Home / Self Care | Payer: Medicaid Other | Attending: Emergency Medicine | Admitting: Emergency Medicine

## 2017-02-12 ENCOUNTER — Encounter (HOSPITAL_COMMUNITY): Payer: Self-pay

## 2017-02-12 DIAGNOSIS — H9391 Unspecified disorder of right ear: Secondary | ICD-10-CM | POA: Diagnosis present

## 2017-02-12 DIAGNOSIS — H6991 Unspecified Eustachian tube disorder, right ear: Secondary | ICD-10-CM | POA: Insufficient documentation

## 2017-02-12 DIAGNOSIS — Z79899 Other long term (current) drug therapy: Secondary | ICD-10-CM | POA: Diagnosis not present

## 2017-02-12 DIAGNOSIS — Z87891 Personal history of nicotine dependence: Secondary | ICD-10-CM | POA: Insufficient documentation

## 2017-02-12 DIAGNOSIS — H6981 Other specified disorders of Eustachian tube, right ear: Secondary | ICD-10-CM

## 2017-02-12 MED ORDER — IPRATROPIUM BROMIDE 0.03 % NA SOLN: 2.0000 | Freq: Two times a day (BID) | NASAL | 0 refills | Status: DC

## 2017-02-12 NOTE — ED Notes (Signed)
Pt well appearing at discharge, ambulates off unit without difficulty

## 2017-02-12 NOTE — ED Triage Notes (Signed)
Pt presents with 2 day h/o R ear pain, denies any drainage from ear, denies cold symptoms;  Pt is 8 months pregnant.

## 2017-02-12 NOTE — Discharge Instructions (Signed)
Contact a health care provider if:  Your symptoms do not go away after treatment.  Your symptoms come back after treatment.  You are unable to pop your ears.  You have:  A fever.  Pain in your ear.  Pain in your head or neck.  Fluid draining from your ear.  Your hearing suddenly changes.  You become very dizzy.  You lose your balance.

## 2017-02-12 NOTE — ED Provider Notes (Signed)
[facility]-EMERGENCY DEPT Provider Note   CSN: [id number] Arrival date & time: 02/12/17  1653     History   Chief Complaint No chief complaint on file.   HPI Lori Short is a 21 y.o. female with cc of R ear discomforty. Onset 3 days ago. Feels like there is " something in it." No pain, no decreased hearing. Sensation of fullness and pressure. No ringing. Patient is 32 weeks pregnenant. States: everything is different since I got pregnant."  HPI  Past Medical History:  Diagnosis Date  . Bronchitis   . Headache     Patient Active Problem List   Diagnosis Date Noted  . GBS bacteriuria 11/29/2016  . Rubella non-immune status, antepartum 11/20/2016  . Substance abuse affecting pregnancy, antepartum 11/20/2016  . 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 Supervision of high risk pregnancy, antepartum 10/23/2016  . Smoker 09/30/2014  . Maternal obesity, antepartum 09/30/2014    Past Surgical History:  Procedure Laterality Date  . NO PAST SURGERIES      OB History    Gravida Para Term Preterm AB Living   1             SAB TAB Ectopic Multiple Live Births                   Home Medications    Prior to Admission medications   Medication Sig Start Date End Date Taking? Authorizing Provider  acetaminophen (TYLENOL) 500 MG tablet Take 1,000 mg by mouth every 6 (six) hours as needed for mild pain, moderate pain or headache.    [provider]  cephALEXin (KEFLEX) 500 MG capsule Take 1 capsule (500 mg total) by mouth 3 (three) times daily. 12/18/16   Adam Phenix, MD  Prenatal MV-Min-FA-Omega-3 (PRENATAL GUMMIES/DHA & FA) 0.4-32.5 MG CHEW Chew 2 each by mouth daily.    [provider]  terconazole (TERAZOL 7) 0.4 % vaginal cream Place 1 applicator vaginally at bedtime. 01/23/17   Rasch, Harolyn Rutherford, NP    Family History Family History  Problem Relation Age of Onset  . Hypertension Maternal Grandmother   . Heart disease Maternal Grandmother     Social  History Social History  Substance Use Topics  . Smoking status: Former Smoker    Packs/day: 0.25    Years: 1.00    Types: Cigarettes    Quit date: 04/14/2015  . Smokeless tobacco: Former Neurosurgeon  . Alcohol use Yes     Comment: occ     Allergies   Shellfish allergy   Review of Systems Review of Systems  Constitutional: Negative for fever.  HENT: Negative for ear discharge, ear pain and facial swelling.        Ear fullness in R     Physical Exam Updated Vital Signs BP 106/62   Pulse (!) 102   Temp 98.4 F (36.9 C) (Axillary)   Resp 18   Ht 5\' 5"  (1.651 m)   Wt 102.1 kg (225 lb)   LMP 07/01/2016 (Exact Date)   SpO2 100%   BMI 37.44 kg/m   Physical Exam  Constitutional: [name], place, and time. [name]. No distress.  HENT:  Head: Normocephalic and atraumatic.  Right Ear: External ear normal. Tympanic membrane is not injected and not scarred. A middle ear effusion is present.  Left Ear: External ear normal. Tympanic membrane is not injected and not scarred. A middle ear effusion is present.  Eyes: Conjunctivae and  EOM are normal. Pupils are equal, round, and reactive to light. No scleral icterus.  Neck: Normal range of motion.  Cardiovascular: Normal rate, regular rhythm and normal heart sounds.  Exam reveals no gallop and no friction rub.   No murmur heard. Pulmonary/Chest: Effort normal and breath sounds normal. No respiratory distress.  Abdominal: Soft. Bowel sounds are normal. She exhibits no distension and no mass. There is no tenderness. There is no guarding.  Gravid abdomen  Neurological: [name], place, and time.  Skin: Skin is warm and dry. She is not diaphoretic.  Psychiatric: Her behavior is normal.  Nursing note and vitals reviewed.    ED Treatments / Results  Labs (all labs ordered are listed, but only abnormal results are displayed) Labs Reviewed - No data to  display  EKG  EKG Interpretation None       Radiology No results found.  Procedures Procedures (including critical care time)  Medications Ordered in ED Medications - No data to display   Initial Impression / Assessment and Plan / ED Course  I have reviewed the triage vital signs and the nursing notes.  Pertinent labs & imaging results that were available during my care of the patient were reviewed by me and considered in my medical decision making (see chart for details).     Patient without foreign body in the canal, no cerumen impaction, no evidence of infection. I suspect that this is eustachian tube dysfunction. Patient will be given Atrovent nasal spray, which is a category B pregnancy medication. Have also given her follow-up with ear, nose and throat doctor if her symptoms are worsening. She is no signs of otitis media or mastoiditis. She appears safe for discharge at this time with close follow-up.  Final Clinical Impressions(s) / ED Diagnoses   Final diagnoses:  Eustachian tube dysfunction, right    New Prescriptions New Prescriptions   No medications on file     Arthor CaptainHarris, Won Kreuzer, Cordelia Poche-C 02/12/17 1920    Blane OharaZavitz, Joshua, MD 02/13/17 [phone]

## 2017-02-15 ENCOUNTER — Ambulatory Visit (INDEPENDENT_AMBULATORY_CARE_PROVIDER_SITE_OTHER): Payer: Medicaid Other | Admitting: Obstetrics

## 2017-02-15 VITALS — BP 125/76 | HR 124 | Wt 224.4 lb

## 2017-02-15 DIAGNOSIS — O99213 Obesity complicating pregnancy, third trimester: Secondary | ICD-10-CM | POA: Diagnosis not present

## 2017-02-15 DIAGNOSIS — Z3483 Encounter for supervision of other normal pregnancy, third trimester: Secondary | ICD-10-CM

## 2017-02-15 DIAGNOSIS — O99210 Obesity complicating pregnancy, unspecified trimester: Secondary | ICD-10-CM

## 2017-02-15 DIAGNOSIS — Z3400 Encounter for supervision of normal first pregnancy, unspecified trimester: Secondary | ICD-10-CM

## 2017-02-15 DIAGNOSIS — O99320 Drug use complicating pregnancy, unspecified trimester: Secondary | ICD-10-CM

## 2017-02-15 DIAGNOSIS — O99323 Drug use complicating pregnancy, third trimester: Secondary | ICD-10-CM | POA: Diagnosis not present

## 2017-02-15 DIAGNOSIS — M549 Dorsalgia, unspecified: Secondary | ICD-10-CM | POA: Diagnosis not present

## 2017-02-15 MED ORDER — CYCLOBENZAPRINE HCL 10 MG PO TABS: 10.0000 mg | ORAL_TABLET | Freq: Three times a day (TID) | ORAL | 1 refills | Status: DC | PRN

## 2017-02-15 MED ORDER — CITRANATAL BLOOM 90-1 MG PO TABS: 1.0000 | ORAL_TABLET | Freq: Every day | ORAL | 11 refills | Status: DC

## 2017-02-15 NOTE — Progress Notes (Signed)
Subjective:  Lori Short is a 21 y.o. G1P0 at 2883w1d being seen today for ongoing prenatal care.  She is currently monitored for the following issues for this high-risk pregnancy and has Smoker; Maternal obesity, antepartum; Supervision of high risk pregnancy, antepartum; Fetal renal anomaly; Rubella non-immune status, antepartum; Substance abuse affecting pregnancy, antepartum; and GBS bacteriuria on her problem list.  Patient reports backache.  Contractions: Not present. Vag. Bleeding: None.  Movement: Present. Denies leaking of fluid.   The following portions of the patient's history were reviewed and updated as appropriate: allergies, current medications, past family history, past medical history, past social history, past surgical history and problem list. Problem list updated.  Objective:   Vitals:   02/15/17 1113  BP: 125/76  Pulse: (!) 124  Weight: 224 lb 6.4 oz (101.8 kg)    Fetal Status: Fetal Heart Rate (bpm): 140   Movement: Present     General:  Alert, oriented and cooperative. Patient is in no acute distress.  Skin: Skin is warm and dry. No rash noted.   Cardiovascular: Normal heart rate noted  Respiratory: Normal respiratory effort, no problems with respiration noted  Abdomen: Soft, gravid, appropriate for gestational age. Pain/Pressure: Present     Pelvic:  Cervical exam deferred        Extremities: Normal range of motion.  Edema: None  Mental Status: Normal mood and affect. Normal behavior. Normal judgment and thought content.   Urinalysis:      Assessment and Plan:  Pregnancy: G1P0 at 4283w1d  1. Supervision of normal first pregnancy, antepartum Rx: - Prenatal-DSS-FeCb-FeGl-FA (CITRANATAL BLOOM) 90-1 MG TABS; Take 1 tablet by mouth daily before breakfast.  Dispense: 90 tablet; Refill: 11  2. Maternal obesity, antepartum   3. Substance abuse affecting pregnancy, antepartum   4. Backache symptom Rx: - cyclobenzaprine (FLEXERIL) 10 MG tablet; Take 1 tablet  (10 mg total) by mouth every 8 (eight) hours as needed for muscle spasms.  Dispense: 30 tablet; Refill: 1 - Maternity Belt Rx  Preterm labor symptoms and general obstetric precautions including but not limited to vaginal bleeding, contractions, leaking of fluid and fetal movement were reviewed in detail with the patient. Please refer to After Visit Summary for other counseling recommendations.  Return in 2 weeks (on 03/01/2017) for ROB.  GBS.Marland Kitchen.   Brock BadHarper, Deo Mehringer A, MDPatient ID: Lori Short, female   DOB: 12/06/1995, 21 y.o.   MRN: [id number]

## 2017-02-15 NOTE — Progress Notes (Signed)
Pt presents for a ROB. She is complaining of back pain

## 2017-02-19 ENCOUNTER — Ambulatory Visit (HOSPITAL_COMMUNITY): Admission: RE | Admit: 2017-02-19 | Payer: Medicaid Other | Source: Ambulatory Visit

## 2017-03-04 ENCOUNTER — Other Ambulatory Visit (HOSPITAL_COMMUNITY)
Admission: RE | Admit: 2017-03-04 | Discharge: 2017-03-04 | Disposition: A | Discharge status: Home / Self Care | Payer: Medicaid Other | Source: Ambulatory Visit | Attending: Obstetrics | Admitting: Obstetrics

## 2017-03-04 ENCOUNTER — Encounter: Payer: Self-pay | Admitting: Obstetrics

## 2017-03-04 ENCOUNTER — Ambulatory Visit (INDEPENDENT_AMBULATORY_CARE_PROVIDER_SITE_OTHER): Payer: Medicaid Other | Admitting: Obstetrics

## 2017-03-04 VITALS — BP 127/82 | HR 96 | Wt 228.0 lb

## 2017-03-04 DIAGNOSIS — Z3400 Encounter for supervision of normal first pregnancy, unspecified trimester: Secondary | ICD-10-CM

## 2017-03-04 DIAGNOSIS — Z3403 Encounter for supervision of normal first pregnancy, third trimester: Secondary | ICD-10-CM | POA: Insufficient documentation

## 2017-03-04 DIAGNOSIS — Z3483 Encounter for supervision of other normal pregnancy, third trimester: Secondary | ICD-10-CM | POA: Diagnosis not present

## 2017-03-04 LAB — OB RESULTS CONSOLE GC/CHLAMYDIA: GC PROBE AMP, GENITAL: NEGATIVE

## 2017-03-04 NOTE — Progress Notes (Signed)
Subjective:  Lori ClockShaniqua L Short is a 21 y.o. G1P0 at 7965w4d being seen today for ongoing prenatal care.  She is currently monitored for the following issues for this low-risk pregnancy and has Smoker; Maternal obesity, antepartum; Supervision of high risk pregnancy, antepartum; Fetal renal anomaly; Rubella non-immune status, antepartum; Substance abuse affecting pregnancy, antepartum; and GBS bacteriuria on her problem list.  Patient reports no complaints.  Contractions: Irritability. Vag. Bleeding: None.  Movement: Present. Denies leaking of fluid.   The following portions of the patient's history were reviewed and updated as appropriate: allergies, current medications, past family history, past medical history, past social history, past surgical history and problem list. Problem list updated.  Objective:   Vitals:   03/04/17 1114  BP: 127/82  Pulse: 96  Weight: 228 lb (103.4 kg)    Fetal Status:     Movement: Present     General:  Alert, oriented and cooperative. Patient is in no acute distress.  Skin: Skin is warm and dry. No rash noted.   Cardiovascular: Normal heart rate noted  Respiratory: Normal respiratory effort, no problems with respiration noted  Abdomen: Soft, gravid, appropriate for gestational age. Pain/Pressure: Present     Pelvic:  Cervical exam deferred        Extremities: Normal range of motion.  Edema: Trace  Mental Status: Normal mood and affect. Normal behavior. Normal judgment and thought content.   Urinalysis:      Assessment and Plan:  Pregnancy: G1P0 at 7265w4d  There are no diagnoses linked to this encounter. Preterm labor symptoms and general obstetric precautions including but not limited to vaginal bleeding, contractions, leaking of fluid and fetal movement were reviewed in detail with the patient. Please refer to After Visit Summary for other counseling recommendations.  Return in about 1 week (around 03/11/2017) for ROB.   Brock BadHarper, Charles A, MDPatient ID:  Lori Short, female   DOB: 01/15/1996, 21 y.o.   MRN: [id number]

## 2017-03-05 ENCOUNTER — Encounter: Payer: Self-pay | Admitting: *Deleted

## 2017-03-05 LAB — CERVICOVAGINAL ANCILLARY ONLY
Bacterial vaginitis: NEGATIVE
Candida vaginitis: NEGATIVE
Chlamydia: NEGATIVE
Neisseria Gonorrhea: NEGATIVE
TRICH (WINDOWPATH): NEGATIVE

## 2017-03-06 LAB — STREP GP B NAA: Strep Gp B NAA: POSITIVE — AB

## 2017-03-11 ENCOUNTER — Ambulatory Visit (INDEPENDENT_AMBULATORY_CARE_PROVIDER_SITE_OTHER): Payer: Medicaid Other | Admitting: Obstetrics

## 2017-03-11 VITALS — BP 100/66 | HR 94 | Wt 227.8 lb

## 2017-03-11 DIAGNOSIS — O99213 Obesity complicating pregnancy, third trimester: Secondary | ICD-10-CM | POA: Diagnosis not present

## 2017-03-11 DIAGNOSIS — O0990 Supervision of high risk pregnancy, unspecified, unspecified trimester: Secondary | ICD-10-CM

## 2017-03-11 DIAGNOSIS — O0993 Supervision of high risk pregnancy, unspecified, third trimester: Secondary | ICD-10-CM

## 2017-03-11 DIAGNOSIS — O99320 Drug use complicating pregnancy, unspecified trimester: Secondary | ICD-10-CM

## 2017-03-11 DIAGNOSIS — IMO0001 Reserved for inherently not codable concepts without codable children: Secondary | ICD-10-CM

## 2017-03-11 DIAGNOSIS — R8271 Bacteriuria: Secondary | ICD-10-CM

## 2017-03-11 DIAGNOSIS — O358XX0 Maternal care for other (suspected) fetal abnormality and damage, not applicable or unspecified: Secondary | ICD-10-CM | POA: Diagnosis not present

## 2017-03-11 DIAGNOSIS — O9989 Other specified diseases and conditions complicating pregnancy, childbirth and the puerperium: Secondary | ICD-10-CM | POA: Diagnosis not present

## 2017-03-11 DIAGNOSIS — Z2839 Other underimmunization status: Secondary | ICD-10-CM

## 2017-03-11 DIAGNOSIS — O99210 Obesity complicating pregnancy, unspecified trimester: Secondary | ICD-10-CM

## 2017-03-11 DIAGNOSIS — Z283 Underimmunization status: Secondary | ICD-10-CM

## 2017-03-11 NOTE — Progress Notes (Signed)
Subjective:  Lori Short is a 21 y.o. G1P0 at 5293w4d being seen today for ongoing prenatal care.  She is currently monitored for the following issues for this high risk pregnancy and has Smoker; Maternal obesity, antepartum; Supervision of high risk pregnancy, antepartum; Fetal renal anomaly; Rubella non-immune status, antepartum; Substance abuse affecting pregnancy, antepartum; and GBS bacteriuria on her problem list.  Patient reports pelvic pressure.  Contractions: Not present. Vag. Bleeding: None.  Movement: Present. Denies leaking of fluid.   The following portions of the patient's history were reviewed and updated as appropriate: allergies, current medications, past family history, past medical history, past social history, past surgical history and problem list. Problem list updated.  Objective:   Vitals:   03/11/17 1146  BP: 100/66  Pulse: 94  Weight: 227 lb 12.8 oz (103.3 kg)    Fetal Status:     Movement: Present     General:  Alert, oriented and cooperative. Patient is in no acute distress.  Skin: Skin is warm and dry. No rash noted.   Cardiovascular: Normal heart rate noted  Respiratory: Normal respiratory effort, no problems with respiration noted  Abdomen: Soft, gravid, appropriate for gestational age. Pain/Pressure: Present     Pelvic:  Cervical exam performed      Cvx:  Closed / Soft / 50% / -2 / Vtx  Extremities: Normal range of motion.  Edema: Trace  Mental Status: Normal mood and affect. Normal behavior. Normal judgment and thought content.   Urinalysis:      Assessment and Plan:  Pregnancy: G1P0 at 2693w4d  1. Supervision of high risk pregnancy, antepartum - doing well  2. Maternal obesity, antepartum  3. Anomaly of kidney of fetus, single or unspecified fetus - Peds Urology aware  4. Rubella non-immune status, antepartum - vaccination postpartum  5. Substance abuse affecting pregnancy, antepartum  6. GBS bacteriuria - prophylaxis in labor  Term  labor symptoms and general obstetric precautions including but not limited to vaginal bleeding, contractions, leaking of fluid and fetal movement were reviewed in detail with the patient. Please refer to After Visit Summary for other counseling recommendations.  No Follow-up on file.   Brock BadHarper, Charles A, MDPatient ID: Lori Short, female   DOB: 01/17/1996, 21 y.o.   MRN: [id number]

## 2017-03-18 ENCOUNTER — Ambulatory Visit (INDEPENDENT_AMBULATORY_CARE_PROVIDER_SITE_OTHER): Payer: Medicaid Other | Admitting: Obstetrics

## 2017-03-18 ENCOUNTER — Encounter: Payer: Self-pay | Admitting: Obstetrics

## 2017-03-18 ENCOUNTER — Inpatient Hospital Stay (HOSPITAL_COMMUNITY)
Admission: AD | Admit: 2017-03-18 | Discharge: 2017-03-18 | Disposition: A | Discharge status: Home / Self Care | Payer: Medicaid Other | Source: Ambulatory Visit | Attending: Obstetrics and Gynecology | Admitting: Obstetrics and Gynecology

## 2017-03-18 VITALS — BP 137/91 | HR 90 | Wt 228.0 lb

## 2017-03-18 DIAGNOSIS — Z2839 Other underimmunization status: Secondary | ICD-10-CM

## 2017-03-18 DIAGNOSIS — O99213 Obesity complicating pregnancy, third trimester: Secondary | ICD-10-CM | POA: Diagnosis not present

## 2017-03-18 DIAGNOSIS — O0993 Supervision of high risk pregnancy, unspecified, third trimester: Secondary | ICD-10-CM | POA: Diagnosis not present

## 2017-03-18 DIAGNOSIS — O9989 Other specified diseases and conditions complicating pregnancy, childbirth and the puerperium: Secondary | ICD-10-CM

## 2017-03-18 DIAGNOSIS — O99320 Drug use complicating pregnancy, unspecified trimester: Secondary | ICD-10-CM

## 2017-03-18 DIAGNOSIS — O09899 Supervision of other high risk pregnancies, unspecified trimester: Secondary | ICD-10-CM

## 2017-03-18 DIAGNOSIS — O99323 Drug use complicating pregnancy, third trimester: Secondary | ICD-10-CM | POA: Diagnosis not present

## 2017-03-18 DIAGNOSIS — Z3A37 37 weeks gestation of pregnancy: Secondary | ICD-10-CM | POA: Insufficient documentation

## 2017-03-18 DIAGNOSIS — O1213 Gestational proteinuria, third trimester: Secondary | ICD-10-CM

## 2017-03-18 DIAGNOSIS — H539 Unspecified visual disturbance: Secondary | ICD-10-CM

## 2017-03-18 DIAGNOSIS — R030 Elevated blood-pressure reading, without diagnosis of hypertension: Secondary | ICD-10-CM | POA: Diagnosis not present

## 2017-03-18 DIAGNOSIS — O26893 Other specified pregnancy related conditions, third trimester: Secondary | ICD-10-CM | POA: Diagnosis not present

## 2017-03-18 DIAGNOSIS — IMO0001 Reserved for inherently not codable concepts without codable children: Secondary | ICD-10-CM

## 2017-03-18 DIAGNOSIS — O1203 Gestational edema, third trimester: Secondary | ICD-10-CM

## 2017-03-18 DIAGNOSIS — Z283 Underimmunization status: Secondary | ICD-10-CM

## 2017-03-18 DIAGNOSIS — O133 Gestational [pregnancy-induced] hypertension without significant proteinuria, third trimester: Secondary | ICD-10-CM

## 2017-03-18 DIAGNOSIS — F1910 Other psychoactive substance abuse, uncomplicated: Secondary | ICD-10-CM

## 2017-03-18 DIAGNOSIS — O358XX0 Maternal care for other (suspected) fetal abnormality and damage, not applicable or unspecified: Secondary | ICD-10-CM

## 2017-03-18 DIAGNOSIS — Z8759 Personal history of other complications of pregnancy, childbirth and the puerperium: Secondary | ICD-10-CM | POA: Insufficient documentation

## 2017-03-18 DIAGNOSIS — R8271 Bacteriuria: Secondary | ICD-10-CM

## 2017-03-18 DIAGNOSIS — Z87891 Personal history of nicotine dependence: Secondary | ICD-10-CM | POA: Diagnosis not present

## 2017-03-18 DIAGNOSIS — O0990 Supervision of high risk pregnancy, unspecified, unspecified trimester: Secondary | ICD-10-CM

## 2017-03-18 DIAGNOSIS — O99210 Obesity complicating pregnancy, unspecified trimester: Secondary | ICD-10-CM

## 2017-03-18 LAB — URINALYSIS, ROUTINE W REFLEX MICROSCOPIC
Bilirubin Urine: NEGATIVE
Glucose, UA: NEGATIVE mg/dL
Hgb urine dipstick: NEGATIVE
Ketones, ur: NEGATIVE mg/dL
NITRITE: NEGATIVE
Protein, ur: 100 mg/dL — AB
SPECIFIC GRAVITY, URINE: 1.026 (ref 1.005–1.030)
pH: 5 (ref 5.0–8.0)

## 2017-03-18 LAB — CBC
HCT: 31.1 % — ABNORMAL LOW (ref 36.0–46.0)
Hemoglobin: 9 g/dL — ABNORMAL LOW (ref 12.0–15.0)
MCH: 19.5 pg — ABNORMAL LOW (ref 26.0–34.0)
MCHC: 28.9 g/dL — AB (ref 30.0–36.0)
MCV: 67.3 fL — ABNORMAL LOW (ref 78.0–100.0)
PLATELETS: 376 10*3/uL (ref 150–400)
RBC: 4.62 MIL/uL (ref 3.87–5.11)
RDW: 17.8 % — ABNORMAL HIGH (ref 11.5–15.5)
WBC: 10.4 10*3/uL (ref 4.0–10.5)

## 2017-03-18 LAB — COMPREHENSIVE METABOLIC PANEL
ALBUMIN: 3.1 g/dL — AB (ref 3.5–5.0)
ALT: 14 U/L (ref 14–54)
AST: 24 U/L (ref 15–41)
Alkaline Phosphatase: 186 U/L — ABNORMAL HIGH (ref 38–126)
Anion gap: 9 (ref 5–15)
BUN: 6 mg/dL (ref 6–20)
CALCIUM: 9.3 mg/dL (ref 8.9–10.3)
CO2: 19 mmol/L — AB (ref 22–32)
CREATININE: 0.74 mg/dL (ref 0.44–1.00)
Chloride: 107 mmol/L (ref 101–111)
GFR calc Af Amer: >60 mL/min (ref >60)
GFR calc non Af Amer: >60 mL/min (ref >60)
GLUCOSE: 80 mg/dL (ref 65–99)
Potassium: 4.4 mmol/L (ref 3.5–5.1)
SODIUM: 135 mmol/L (ref 135–145)
Total Bilirubin: 0.5 mg/dL (ref 0.3–1.2)
Total Protein: 7.4 g/dL (ref 6.5–8.1)

## 2017-03-18 LAB — PROTEIN / CREATININE RATIO, URINE
CREATININE, URINE: 458 mg/dL
Protein Creatinine Ratio: 0.12 mg/mg{Cre} (ref 0.00–0.15)
Total Protein, Urine: 57 mg/dL

## 2017-03-18 MED ORDER — SERTRALINE HCL 50 MG PO TABS: 50.0000 mg | ORAL_TABLET | Freq: Every day | ORAL | 5 refills | Status: DC

## 2017-03-18 NOTE — Discharge Instructions (Signed)
You were evaluated in the MAU for elevated blood pressures, which came down to normal. It is important that you follow up in the clinic tomorrow for a blood pressure check, as we discussed.   Contact a doctor if right away if:  You have very bad pain in your belly (abdomen).  You are throwing up, and this does not get better with treatment.  You suddenly get swelling in your hands, ankles, or face.  You gain 4 lb (1.8 kg) or more in 1 week.  You get bleeding from your vagina.  You have blood in your pee.  You do not feel your baby moving as much as normal.  You have a change in vision.  You have muscle twitching or sudden tightening (spasms).  You have trouble breathing.  Your lips or fingernails turn blue. This information is not intended to replace advice given to you by your health care provider. Make sure you discuss any questions you have with your health care provider. Document Released: 10/13/2010 Document Revised: 05/22/2016 Document Reviewed: 05/22/2016 Elsevier Interactive Patient Education  2017 [employer]

## 2017-03-18 NOTE — Addendum Note (Signed)
Addended by: Coral CeoHARPER, CHARLES A on: 03/18/2017 01:30 PM   Modules accepted: Orders

## 2017-03-18 NOTE — MAU Provider Note (Signed)
MAU HISTORY AND PHYSICAL  Chief Complaint: Elevated BP  Lori Short is a 21 y.o.  G1P0 with IUP at [redacted]w[redacted]d presenting for elevated blood pressure noted in clinic today at 137/90. Previously blood pressures had been controlled. She notes that she has had headaches for the past two months intermittently but denies headaches today, and they typically go away with Tylenol. Notes she has had transient blurry vision at some point in the last week but denies any blurry vision today. No right upper quadrant pain.  Otherwise, patient denies contractions, vaginal bleeding, or rupture of membranes. Endorses fetal movement.   Past Medical History:  Diagnosis Date  . Bronchitis   . Headache     Past Surgical History:  Procedure Laterality Date  . NO PAST SURGERIES      Family History  Problem Relation Age of Onset  . Hypertension Maternal Grandmother   . Heart disease Maternal Grandmother     Social History  Substance Use Topics  . Smoking status: Former Smoker    Packs/day: 0.25    Years: 1.00    Types: Cigarettes    Quit date: 04/14/2015  . Smokeless tobacco: Former Neurosurgeon  . Alcohol use Yes     Comment: occ    Allergies  Allergen Reactions  . Shellfish Allergy Hives, Nausea And Vomiting and Swelling    Reaction:  Tongue swelling    Prescriptions Prior to Admission  Medication Sig Dispense Refill Last Dose  . acetaminophen (TYLENOL) 500 MG tablet Take 1,000 mg by mouth every 6 (six) hours as needed for mild pain, moderate pain or headache.   03/17/2017 at Unknown time  . cyclobenzaprine (FLEXERIL) 10 MG tablet Take 1 tablet (10 mg total) by mouth every 8 (eight) hours as needed for muscle spasms. (Patient not taking: Reported on 03/04/2017) 30 tablet 1 Not Taking at Unknown time  . ipratropium (ATROVENT) 0.03 % nasal spray Place 2 sprays into the nose 2 (two) times daily. PRN congestion (Patient not taking: Reported on 02/15/2017) 30 mL 0 Not Taking at Unknown time  .  Prenatal-DSS-FeCb-FeGl-FA (CITRANATAL BLOOM) 90-1 MG TABS Take 1 tablet by mouth daily before breakfast. (Patient not taking: Reported on 03/11/2017) 90 tablet 11 Not Taking at Unknown time    Review of Systems - Negative except for what is mentioned in HPI.  Physical Exam  Blood pressure 134/80, pulse 99, temperature 98.2 F (36.8 C), resp. rate 18, height 5\' 3"  (1.6 m), weight 103 kg (227 lb), last menstrual period 07/01/2016. GENERAL: Well-developed, well-nourished obese female in no acute distress.   ABDOMEN: No RUQ/epigastric pain on palpation. Gravida appropriate for gestational age.  EXTREMITIES: Nontender, no edema, 2+ distal pulses. 2/4 patellar reflexes. Cervical Exam: 1.5/50%/-2 Presentation: cephalic by cervical exam FHT:  130 bmp, mod var, accels no decels Contractions: Occasionally   Labs: Results for orders placed or performed during the hospital encounter of 03/18/17 (from the past 24 hour(s))  Urinalysis, Routine w reflex microscopic   Collection Time: 03/18/17 12:20 PM  Result Value Ref Range   Color, Urine AMBER (A) YELLOW   APPearance CLOUDY (A) CLEAR   Specific Gravity, Urine 1.026 1.005 - 1.030   pH 5.0 5.0 - 8.0   Glucose, UA NEGATIVE NEGATIVE mg/dL   Hgb urine dipstick NEGATIVE NEGATIVE   Bilirubin Urine NEGATIVE NEGATIVE   Ketones, ur NEGATIVE NEGATIVE mg/dL   Protein, ur 161 (A) NEGATIVE mg/dL   Nitrite NEGATIVE NEGATIVE   Leukocytes, UA SMALL (A) NEGATIVE   RBC /  HPF 6-30 0 - 5 RBC/hpf   WBC, UA 6-30 0 - 5 WBC/hpf   Bacteria, UA FEW (A) NONE SEEN   Squamous Epithelial / LPF TOO NUMEROUS TO COUNT (A) NONE SEEN   Mucous PRESENT   Protein / creatinine ratio, urine   Collection Time: 03/18/17 12:20 PM  Result Value Ref Range   Creatinine, Urine 458.00 mg/dL   Total Protein, Urine 57 mg/dL   Protein Creatinine Ratio 0.12 0.00 - 0.15 mg/mg[Cre]  Comprehensive metabolic panel   Collection Time: 03/18/17 12:41 PM  Result Value Ref Range   Sodium 135 135  - 145 mmol/L   Potassium 4.4 3.5 - 5.1 mmol/L   Chloride 107 101 - 111 mmol/L   CO2 19 (L) 22 - 32 mmol/L   Glucose, Bld 80 65 - 99 mg/dL   BUN 6 6 - 20 mg/dL   Creatinine, Ser 1.61 0.44 - 1.00 mg/dL   Calcium 9.3 8.9 - 09.6 mg/dL   Total Protein 7.4 6.5 - 8.1 g/dL   Albumin 3.1 (L) 3.5 - 5.0 g/dL   AST 24 15 - 41 U/L   ALT 14 14 - 54 U/L   Alkaline Phosphatase 186 (H) 38 - 126 U/L   Total Bilirubin 0.5 0.3 - 1.2 mg/dL   GFR calc non Af Amer >60 >60 mL/min   GFR calc Af Amer >60 >60 mL/min   Anion gap 9 5 - 15  CBC   Collection Time: 03/18/17 12:41 PM  Result Value Ref Range   WBC 10.4 4.0 - 10.5 K/uL   RBC 4.62 3.87 - 5.11 MIL/uL   Hemoglobin 9.0 (L) 12.0 - 15.0 g/dL   HCT 04.5 (L) 40.9 - 81.1 %   MCV 67.3 (L) 78.0 - 100.0 fL   MCH 19.5 (L) 26.0 - 34.0 pg   MCHC 28.9 (L) 30.0 - 36.0 g/dL   RDW 91.4 (H) 78.2 - 95.6 %   Platelets 376 150 - 400 K/uL    Assessment: Lori Short is  21 y.o. G1P0 at [redacted]w[redacted]d presents from clinic with elevated blood pressure.  Plan:  Elevated Blood Pressure, transient Blood pressure was initially elevated upon arrival to MAU at 148/100, then improved at 102/83, then was elevated at 141/94.  She was put on blood pressure monitoring q76m and labs for pre-eclampsia were sent. CMP, Pr:Cr and CBC were reassuring. About one half of blood pressures in the MAU were elevated while the other half were normal. Her pressures normalized over two hours (111/68, 114/62). Decision was made to DC home with very close follow up - Message sent to clinic - patient to have BP checked tomorrow (6/26), if elevated please send back to MAU for admission and induction for gestational hypertension. - Patient will also call the office in the AM to confirm this appointment - preE labs WNL - strict return precautions provided - patient expresses understanding and agreement with this plan - Case precepted with Dr. Anselmo Pickler, MD PGY-1 [facility]  Residency  6/25/20182:41 PM   OB FELLOW MAU DISCHARGE ATTESTATION  I have seen and examined this patient; I agree with above documentation in the resident's note. I personally examined the patient and reviewed patient's BPs and labs. I agree with the above findings and decision. Spoke with the patient thoroughly about return precautions, especially symptoms of preeclampsia. Emphasized importance of follow up in office for repeat BP check and possibility of IOL if elevated again. Patient gave verbal feedback of  understanding. OK for discharge.   I personally reviewed the patient's NST today, found to be REACTIVE. 130 bpm, mod var, +accels, no decels. CTX: Irregular, q5-8 min.   Jen MowElizabeth Ruther Ephraim, DO OB Fellow 5:56 PM

## 2017-03-18 NOTE — Progress Notes (Signed)
Pt states she is having increase in pelvic pressure. Pt states that she has been having HA and some changes in vision.

## 2017-03-18 NOTE — Progress Notes (Signed)
Subjective:  Lori Short is Short 21 y.o. G1P0 at 7234w4d being seen today for ongoing prenatal care.  She is currently monitored for the following issues for this high-risk pregnancy and has Smoker; Maternal obesity, antepartum; Supervision of high risk pregnancy, antepartum; Fetal renal anomaly; Rubella non-immune status, antepartum; Substance abuse affecting pregnancy, antepartum; GBS bacteriuria; PIH (pregnancy induced hypertension), third trimester; Proteinuria affecting pregnancy in third trimester; Edema during pregnancy in third trimester; and Visual changes on her problem list.  Patient reports headache and visual changes and swelling of hands and face.  Contractions: Irregular. Vag. Bleeding: None.  Movement: Present. Denies leaking of fluid.   The following portions of the patient's history were reviewed and updated as appropriate: allergies, current medications, past family history, past medical history, past social history, past surgical history and problem list. Problem list updated.  Objective:   Vitals:   03/18/17 1103 03/18/17 1117  BP: 137/90 (!) 137/91  Pulse: 90   Weight: 228 lb (103.4 kg)     Fetal Status: Fetal Heart Rate (bpm): 140   Movement: Present     General:  Alert, oriented and cooperative. Patient is in no acute distress.  Skin: Skin is warm and dry. No rash noted.   Cardiovascular: Normal heart rate noted  Respiratory: Normal respiratory effort, no problems with respiration noted  Abdomen: Soft, gravid, appropriate for gestational age. Pain/Pressure: Present     Pelvic:  Cervical exam deferred        Extremities: Normal range of motion.  Edema: Trace  Mental Status: Normal mood and affect. Normal behavior. Normal judgment and thought content.   Urinalysis: Urine Protein: 1+ Urine Glucose: Negative  Assessment and Plan:  Pregnancy: G1P0 at 5034w4d  1. Supervision of high risk pregnancy, antepartum  2. [facility]  3.  Proteinuria, Edema and Visual Changes  4. Maternal obesity, antepartum   5. Anomaly of kidney of fetus, single or unspecified fetus - [facility]  6. Substance abuse affecting pregnancy, antepartum - stable  7. GBS bacteriuria Rx: - Urine culture repeated  8. Rubella non-immune status, antepartum - vaccination postpartum  Term labor symptoms and general obstetric precautions including but not limited to vaginal bleeding, contractions, leaking of fluid and fetal movement were reviewed in detail with the patient. Please refer to After Visit Summary for other counseling recommendations.  Return in about 1 week (around 03/25/2017) for ROB.   Lori Short, Lori Short, MDPatient ID: Lori Short, female   DOB: 12/03/1995, 21 y.o.   MRN: [id number]

## 2017-03-18 NOTE — MAU Note (Signed)
Pt presents to MAU from physician's office for increase in blood.  Pt has had headache and visual changes this week on and off denies any pain at this time. Denies any VB or abnormal discharge

## 2017-03-19 ENCOUNTER — Ambulatory Visit: Payer: Medicaid Other

## 2017-03-19 VITALS — BP 126/89 | HR 105

## 2017-03-19 DIAGNOSIS — Z0130 Encounter for examination of blood pressure without abnormal findings: Secondary | ICD-10-CM

## 2017-03-19 NOTE — Progress Notes (Signed)
Patient is in the office for BP check. Pt states she is currently not taking any BP meds. Pt denies pain and blurred vision, BP 126/89. [facility] symptoms of elevated BP return, pt agreed.

## 2017-03-20 LAB — URINE CULTURE

## 2017-03-21 ENCOUNTER — Inpatient Hospital Stay (HOSPITAL_COMMUNITY)
Admission: AD | Admit: 2017-03-21 | Discharge: 2017-03-21 | Disposition: A | Discharge status: Home / Self Care | Payer: Medicaid Other | Source: Ambulatory Visit | Attending: Obstetrics & Gynecology | Admitting: Obstetrics & Gynecology

## 2017-03-21 DIAGNOSIS — Z91013 Allergy to seafood: Secondary | ICD-10-CM | POA: Diagnosis not present

## 2017-03-21 DIAGNOSIS — M62838 Other muscle spasm: Secondary | ICD-10-CM | POA: Insufficient documentation

## 2017-03-21 DIAGNOSIS — R200 Anesthesia of skin: Secondary | ICD-10-CM | POA: Insufficient documentation

## 2017-03-21 DIAGNOSIS — Z87891 Personal history of nicotine dependence: Secondary | ICD-10-CM | POA: Diagnosis not present

## 2017-03-21 DIAGNOSIS — O26893 Other specified pregnancy related conditions, third trimester: Secondary | ICD-10-CM | POA: Diagnosis present

## 2017-03-21 DIAGNOSIS — R51 Headache: Secondary | ICD-10-CM | POA: Diagnosis not present

## 2017-03-21 DIAGNOSIS — O9989 Other specified diseases and conditions complicating pregnancy, childbirth and the puerperium: Secondary | ICD-10-CM

## 2017-03-21 DIAGNOSIS — Z3A38 38 weeks gestation of pregnancy: Secondary | ICD-10-CM | POA: Diagnosis not present

## 2017-03-21 DIAGNOSIS — Z8249 Family history of ischemic heart disease and other diseases of the circulatory system: Secondary | ICD-10-CM | POA: Insufficient documentation

## 2017-03-21 DIAGNOSIS — Z79899 Other long term (current) drug therapy: Secondary | ICD-10-CM | POA: Diagnosis not present

## 2017-03-21 LAB — URINALYSIS, ROUTINE W REFLEX MICROSCOPIC
Bilirubin Urine: NEGATIVE
GLUCOSE, UA: NEGATIVE mg/dL
Hgb urine dipstick: NEGATIVE
KETONES UR: NEGATIVE mg/dL
NITRITE: NEGATIVE
PH: 5 (ref 5.0–8.0)
Protein, ur: 30 mg/dL — AB
SPECIFIC GRAVITY, URINE: 1.015 (ref 1.005–1.030)

## 2017-03-21 MED ORDER — CYCLOBENZAPRINE HCL 5 MG PO TABS: 5.0000 mg | ORAL_TABLET | Freq: Three times a day (TID) | ORAL | 0 refills | Status: DC | PRN

## 2017-03-21 MED ORDER — CYCLOBENZAPRINE HCL 10 MG PO TABS
10.0000 mg | ORAL_TABLET | Freq: Once | ORAL | Status: AC
  Administered 2017-03-21: 10 mg via ORAL
  Filled 2017-03-21: qty 1

## 2017-03-21 NOTE — Discharge Instructions (Signed)
Cervical Strain and Sprain Rehab Ask your health care provider which exercises are safe for you. Do exercises exactly as told by your health care provider and adjust them as directed. It is normal to feel mild stretching, pulling, tightness, or discomfort as you do these exercises, but you should stop right away if you feel sudden pain or your pain gets worse.Do not begin these exercises until told by your health care provider. Stretching and range of motion exercises These exercises warm up your muscles and joints and improve the movement and flexibility of your neck. These exercises also help to relieve pain, numbness, and tingling. Exercise A: Cervical side bend  1. Using good posture, sit on a stable chair or stand up. 2. Without moving your shoulders, slowly tilt your left / right ear to your shoulder until you feel a stretch in your neck muscles. You should be looking straight ahead. 3. Hold for __________ seconds. 4. Repeat with the other side of your neck. Repeat __________ times. Complete this exercise __________ times a day. Exercise B: Cervical rotation  1. Using good posture, sit on a stable chair or stand up. 2. Slowly turn your head to the side as if you are looking over your left / right shoulder. ? Keep your eyes level with the ground. ? Stop when you feel a stretch along the side and the back of your neck. 3. Hold for __________ seconds. 4. Repeat this by turning to your other side. Repeat __________ times. Complete this exercise __________ times a day. Exercise C: Thoracic extension and pectoral stretch 1. Roll a towel or a small blanket so it is about 4 inches (10 cm) in diameter. 2. Lie down on your back on a firm surface. 3. Put the towel lengthwise, under your spine in the middle of your back. It should not be not under your shoulder blades. The towel should line up with your spine from your middle back to your lower back. 4. Put your hands behind your head and let your  elbows fall out to your sides. 5. Hold for __________ seconds. Repeat __________ times. Complete this exercise __________ times a day. Strengthening exercises These exercises build strength and endurance in your neck. Endurance is the ability to use your muscles for a long time, even after your muscles get tired. Exercise D: Upper cervical flexion, isometric 1. Lie on your back with a thin pillow behind your head and a small rolled-up towel under your neck. 2. Gently tuck your chin toward your chest and nod your head down to look toward your feet. Do not lift your head off the pillow. 3. Hold for __________ seconds. 4. Release the tension slowly. Relax your neck muscles completely before you repeat this exercise. Repeat __________ times. Complete this exercise __________ times a day. Exercise E: Cervical extension, isometric  1. Stand about 6 inches (15 cm) away from a wall, with your back facing the wall. 2. Place a soft object, about 6-8 inches (15-20 cm) in diameter, between the back of your head and the wall. A soft object could be a small pillow, a ball, or a folded towel. 3. Gently tilt your head back and press into the soft object. Keep your jaw and forehead relaxed. 4. Hold for __________ seconds. 5. Release the tension slowly. Relax your neck muscles completely before you repeat this exercise. Repeat __________ times. Complete this exercise __________ times a day. Posture and body mechanics  Body mechanics refers to the movements and positions of   your body while you do your daily activities. Posture is part of body mechanics. Good posture and healthy body mechanics can help to relieve stress in your body's tissues and joints. Good posture means that your spine is in its natural S-curve position (your spine is neutral), your shoulders are pulled back slightly, and your head is not tipped forward. The following are general guidelines for applying improved posture and body mechanics to  your everyday activities. Standing  When standing, keep your spine neutral and keep your feet about hip-width apart. Keep a slight bend in your knees. Your ears, shoulders, and hips should line up.  When you do a task in which you stand in one place for a long time, place one foot up on a stable object that is 2-4 inches (5-10 cm) high, such as a footstool. This helps keep your spine neutral. Sitting   When sitting, keep your spine neutral and your keep feet flat on the floor. Use a footrest, if necessary, and keep your thighs parallel to the floor. Avoid rounding your shoulders, and avoid tilting your head forward.  When working at a desk or a computer, keep your desk at a height where your hands are slightly lower than your elbows. Slide your chair under your desk so you are close enough to maintain good posture.  When working at a computer, place your monitor at a height where you are looking straight ahead and you do not have to tilt your head forward or downward to look at the screen. Resting When lying down and resting, avoid positions that are most painful for you. Try to support your neck in a neutral position. You can use a contour pillow or a small rolled-up towel. Your pillow should support your neck but not push on it. This information is not intended to replace advice given to you by your health care provider. Make sure you discuss any questions you have with your health care provider. Document Released: 09/10/2005 Document Revised: 05/17/2016 Document Reviewed: 08/17/2015 Elsevier Interactive Patient Education  2018 [employer]  

## 2017-03-21 NOTE — MAU Note (Signed)
Pt states her whole R arm feels numb - started this afternoon.  Was seen on Tuesday for elevated BP, was DC'd home.  Also has HA & feeling lightheaded.  Has been having uc's since Tuesday, denies bleeding or LOF.

## 2017-03-21 NOTE — MAU Provider Note (Signed)
History     CSN: [id number]  Arrival date and time: 03/21/17 1721   None     Chief Complaint  Patient presents with  . Numbness  . Headache   Patient is a 21 year old G1 P0 at 38 weeks and 0 days who presents today with lower pelvic pressure as well as some right arm numbness or tingling. When she was here several days ago she did have elevated blood pressure and she was concerned that her right arm numbness was related to the elevated blood pressure. She reports no headache blurred vision or right upper quadrant pain. She reports no significant swelling. She reports her right arm and hand feel like pins and needles. She reports no weakness in this arm.    OB History    Gravida Para Term Preterm AB Living   1             SAB TAB Ectopic Multiple Live Births                  Past Medical History:  Diagnosis Date  . Bronchitis   . Headache     Past Surgical History:  Procedure Laterality Date  . NO PAST SURGERIES      Family History  Problem Relation Age of Onset  . Hypertension Maternal Grandmother   . Heart disease Maternal Grandmother     Social History  Substance Use Topics  . Smoking status: Former Smoker    Packs/day: 0.25    Years: 1.00    Types: Cigarettes    Quit date: 04/14/2015  . Smokeless tobacco: Former NeurosurgeonUser  . Alcohol use Yes     Comment: occ    Allergies:  Allergies  Allergen Reactions  . Shellfish Allergy Hives, Nausea And Vomiting and Swelling    Reaction:  Tongue swelling    Prescriptions Prior to Admission  Medication Sig Dispense Refill Last Dose  . acetaminophen (TYLENOL) 500 MG tablet Take 1,000 mg by mouth every 6 (six) hours as needed for mild pain, moderate pain or headache.   Past Week at Unknown time  . cyclobenzaprine (FLEXERIL) 10 MG tablet Take 1 tablet (10 mg total) by mouth every 8 (eight) hours as needed for muscle spasms. (Patient not taking: Reported on 03/04/2017) 30 tablet 1 Not Taking  . ipratropium (ATROVENT) 0.03  % nasal spray Place 2 sprays into the nose 2 (two) times daily. PRN congestion (Patient not taking: Reported on 02/15/2017) 30 mL 0 Not Taking  . Prenatal-DSS-FeCb-FeGl-FA (CITRANATAL BLOOM) 90-1 MG TABS Take 1 tablet by mouth daily before breakfast. (Patient not taking: Reported on 03/11/2017) 90 tablet 11 Not Taking    Review of Systems  Constitutional: Negative for chills.  HENT: Negative for congestion and rhinorrhea.   Respiratory: Negative for apnea, cough and shortness of breath.   Cardiovascular: Negative for chest pain and palpitations.  Gastrointestinal: Negative for abdominal distention, abdominal pain, constipation, diarrhea, nausea and vomiting.  Genitourinary: Negative for difficulty urinating, dysuria and frequency.  Musculoskeletal: Positive for neck pain and neck stiffness. Negative for arthralgias and back pain.  Neurological: Positive for numbness. Negative for dizziness and weakness.   Physical Exam   Blood pressure 134/86, pulse (!) 118, temperature 98.1 F (36.7 C), temperature source Oral, resp. rate 18, last menstrual period 07/01/2016, SpO2 99 %.  Physical Exam  Vitals reviewed. Constitutional: [name], place, and time. [name].  Cardiovascular: Normal rate and intact distal pulses.   Respiratory: Effort  normal. No respiratory distress.  GI: Soft. She exhibits no distension. There is no tenderness. There is no rebound and no guarding.  Genitourinary:  Genitourinary Comments: Cervix was checked and was 2/80/-2.  Musculoskeletal: Normal range of motion. She exhibits no edema.  Significant trapezius spasm worse on the right full range of motion of shoulder. 5 out of 5 bilateral grip strength flexion and extension at the elbow shoulder abduction and shoulder forward flexion. Gross sensation intact bilaterally as well.  Neurological: [name], place, and time.  Skin: Skin is warm and  dry.  Psychiatric: She has a normal mood and affect. Her behavior is normal.    MAU Course  Procedures  MDM Patient was observed in the MA U. She had continuous fetal monitoring which revealed a reactive NST. She had occasional contractions.   The 10 mg of Flexeril both her neck pain and her right arm numbness improved. Assessment and Plan  #1: Trapezius spasm recommended Flexeril or heating pad when necessary.  Ernestina Penna 03/21/2017, 6:35 PM

## 2017-03-25 ENCOUNTER — Encounter: Payer: Self-pay | Admitting: Obstetrics

## 2017-03-25 ENCOUNTER — Ambulatory Visit (INDEPENDENT_AMBULATORY_CARE_PROVIDER_SITE_OTHER): Payer: Medicaid Other | Admitting: Obstetrics

## 2017-03-25 VITALS — BP 132/85 | HR 118 | Wt 233.0 lb

## 2017-03-25 DIAGNOSIS — O0990 Supervision of high risk pregnancy, unspecified, unspecified trimester: Secondary | ICD-10-CM

## 2017-03-25 DIAGNOSIS — O358XX0 Maternal care for other (suspected) fetal abnormality and damage, not applicable or unspecified: Secondary | ICD-10-CM

## 2017-03-25 DIAGNOSIS — O0993 Supervision of high risk pregnancy, unspecified, third trimester: Secondary | ICD-10-CM

## 2017-03-25 DIAGNOSIS — O133 Gestational [pregnancy-induced] hypertension without significant proteinuria, third trimester: Secondary | ICD-10-CM | POA: Diagnosis not present

## 2017-03-25 DIAGNOSIS — IMO0001 Reserved for inherently not codable concepts without codable children: Secondary | ICD-10-CM

## 2017-03-25 NOTE — Progress Notes (Signed)
Subjective:  Lori Short Gauthier is a 21 y.o. G1P0 at 241w4d being seen today for ongoing prenatal care.  She is currently monitored for the following issues for this high-risk pregnancy and has Smoker; Maternal obesity, antepartum; Supervision of high risk pregnancy, antepartum; Fetal renal anomaly; Rubella non-immune status, antepartum; Substance abuse affecting pregnancy, antepartum; GBS bacteriuria; PIH (pregnancy induced hypertension), third trimester; Proteinuria affecting pregnancy in third trimester; Edema during pregnancy in third trimester; and Visual changes on her problem list.  Patient reports backache.  Contractions: Irregular. Vag. Bleeding: None.  Movement: Present. Denies leaking of fluid.   The following portions of the patient's history were reviewed and updated as appropriate: allergies, current medications, past family history, past medical history, past social history, past surgical history and problem list. Problem list updated.  Objective:   Vitals:   03/25/17 1348 03/25/17 1351  BP: (!) 129/92 132/85  Pulse: (!) 118   Weight: 233 lb (105.7 kg)     Fetal Status: Fetal Heart Rate (bpm): 140   Movement: Present     General:  Alert, oriented and cooperative. Patient is in no acute distress.  Skin: Skin is warm and dry. No rash noted.   Cardiovascular: Normal heart rate noted  Respiratory: Normal respiratory effort, no problems with respiration noted  Abdomen: Soft, gravid, appropriate for gestational age. Pain/Pressure: Absent     Pelvic:  Cervical exam deferred        Extremities: Normal range of motion.  Edema: Trace  Mental Status: Normal mood and affect. Normal behavior. Normal judgment and thought content.   Urinalysis:      Assessment and Plan:  Pregnancy: G1P0 at 7641w4d  1. Supervision of high risk pregnancy, antepartum   2. Anomaly of kidney of fetus, single or unspecified fetus - Urology F/U postpartum  3. PIH (pregnancy induced hypertension), third  trimester - BP stable  Term labor symptoms and general obstetric precautions including but not limited to vaginal bleeding, contractions, leaking of fluid and fetal movement were reviewed in detail with the patient. Please refer to After Visit Summary for other counseling recommendations.  Return in about 1 week (around 04/01/2017) for ROB.   Brock BadHarper, Kyra Laffey A, MDPatient ID: Lori Short Griffitts, female   DOB: 12/13/1995, 21 y.o.   MRN: [id number]

## 2017-03-25 NOTE — Progress Notes (Signed)
Pt presents for ROB has no concerns today per pt.  (BP 129/92 repeat 132/85)

## 2017-03-26 ENCOUNTER — Inpatient Hospital Stay (HOSPITAL_COMMUNITY): Payer: Medicaid Other | Admitting: Anesthesiology

## 2017-03-26 ENCOUNTER — Encounter (HOSPITAL_COMMUNITY): Payer: Self-pay

## 2017-03-26 ENCOUNTER — Inpatient Hospital Stay (HOSPITAL_COMMUNITY)
Admission: AD | Admit: 2017-03-26 | Discharge: 2017-03-28 | DRG: 775 | Disposition: A | Discharge status: Home / Self Care | Payer: Medicaid Other | Source: Ambulatory Visit | Attending: Family Medicine | Admitting: Family Medicine

## 2017-03-26 DIAGNOSIS — Z3A38 38 weeks gestation of pregnancy: Secondary | ICD-10-CM | POA: Diagnosis not present

## 2017-03-26 DIAGNOSIS — O9989 Other specified diseases and conditions complicating pregnancy, childbirth and the puerperium: Secondary | ICD-10-CM

## 2017-03-26 DIAGNOSIS — N1330 Unspecified hydronephrosis: Secondary | ICD-10-CM

## 2017-03-26 DIAGNOSIS — O35EXX0 Maternal care for other (suspected) fetal abnormality and damage, fetal genitourinary anomalies, not applicable or unspecified: Secondary | ICD-10-CM

## 2017-03-26 DIAGNOSIS — Z3493 Encounter for supervision of normal pregnancy, unspecified, third trimester: Secondary | ICD-10-CM | POA: Diagnosis present

## 2017-03-26 DIAGNOSIS — O99824 Streptococcus B carrier state complicating childbirth: Principal | ICD-10-CM | POA: Diagnosis present

## 2017-03-26 DIAGNOSIS — Z283 Underimmunization status: Secondary | ICD-10-CM

## 2017-03-26 DIAGNOSIS — O358XX0 Maternal care for other (suspected) fetal abnormality and damage, not applicable or unspecified: Secondary | ICD-10-CM

## 2017-03-26 DIAGNOSIS — Z87891 Personal history of nicotine dependence: Secondary | ICD-10-CM

## 2017-03-26 DIAGNOSIS — Z2839 Other underimmunization status: Secondary | ICD-10-CM

## 2017-03-26 DIAGNOSIS — F1910 Other psychoactive substance abuse, uncomplicated: Secondary | ICD-10-CM

## 2017-03-26 DIAGNOSIS — O09899 Supervision of other high risk pregnancies, unspecified trimester: Secondary | ICD-10-CM

## 2017-03-26 DIAGNOSIS — R8271 Bacteriuria: Secondary | ICD-10-CM

## 2017-03-26 DIAGNOSIS — O99210 Obesity complicating pregnancy, unspecified trimester: Secondary | ICD-10-CM

## 2017-03-26 DIAGNOSIS — O99320 Drug use complicating pregnancy, unspecified trimester: Secondary | ICD-10-CM

## 2017-03-26 DIAGNOSIS — O134 Gestational [pregnancy-induced] hypertension without significant proteinuria, complicating childbirth: Secondary | ICD-10-CM | POA: Diagnosis not present

## 2017-03-26 LAB — ABO/RH: ABO/RH(D): A POS

## 2017-03-26 LAB — CBC
HEMATOCRIT: 30.7 % — AB (ref 36.0–46.0)
HEMOGLOBIN: 9 g/dL — AB (ref 12.0–15.0)
MCH: 19.4 pg — ABNORMAL LOW (ref 26.0–34.0)
MCHC: 29.3 g/dL — AB (ref 30.0–36.0)
MCV: 66 fL — AB (ref 78.0–100.0)
Platelets: 386 10*3/uL (ref 150–400)
RBC: 4.65 MIL/uL (ref 3.87–5.11)
RDW: 18 % — AB (ref 11.5–15.5)
WBC: 10.6 10*3/uL — ABNORMAL HIGH (ref 4.0–10.5)

## 2017-03-26 LAB — TYPE AND SCREEN
ABO/RH(D): A POS
Antibody Screen: NEGATIVE

## 2017-03-26 MED ORDER — ONDANSETRON HCL 4 MG/2ML IJ SOLN
4.0000 mg | Freq: Four times a day (QID) | INTRAMUSCULAR | Status: DC | PRN
  Administered 2017-03-26 (×2): 4 mg via INTRAVENOUS
  Filled 2017-03-26 (×2): qty 2

## 2017-03-26 MED ORDER — EPHEDRINE 5 MG/ML INJ
10.0000 mg | INTRAVENOUS | Status: DC | PRN
  Filled 2017-03-26: qty 2

## 2017-03-26 MED ORDER — LIDOCAINE HCL (PF) 1 % IJ SOLN
INTRAMUSCULAR | Status: DC | PRN
  Administered 2017-03-26: 4 mL
  Administered 2017-03-26: 6 mL via EPIDURAL

## 2017-03-26 MED ORDER — FLEET ENEMA 7-19 GM/118ML RE ENEM: 1.0000 | ENEMA | RECTAL | Status: DC | PRN

## 2017-03-26 MED ORDER — LACTATED RINGERS IV SOLN
500.0000 mL | INTRAVENOUS | Status: DC | PRN
  Administered 2017-03-26: 500 mL via INTRAVENOUS

## 2017-03-26 MED ORDER — PENICILLIN G POT IN DEXTROSE 60000 UNIT/ML IV SOLN
3.0000 10*6.[IU] | INTRAVENOUS | Status: DC
  Administered 2017-03-26 (×3): 3 10*6.[IU] via INTRAVENOUS
  Filled 2017-03-26 (×7): qty 50

## 2017-03-26 MED ORDER — FENTANYL 2.5 MCG/ML BUPIVACAINE 1/10 % EPIDURAL INFUSION (WH - ANES)
14.0000 mL/h | INTRAMUSCULAR | Status: DC | PRN
  Administered 2017-03-26 (×2): 14 mL/h via EPIDURAL
  Filled 2017-03-26 (×2): qty 100

## 2017-03-26 MED ORDER — SOD CITRATE-CITRIC ACID 500-334 MG/5ML PO SOLN: 30.0000 mL | ORAL | Status: DC | PRN

## 2017-03-26 MED ORDER — LIDOCAINE HCL (PF) 1 % IJ SOLN
30.0000 mL | INTRAMUSCULAR | Status: DC | PRN
  Filled 2017-03-26: qty 30

## 2017-03-26 MED ORDER — OXYTOCIN BOLUS FROM INFUSION: 500.0000 mL | Freq: Once | INTRAVENOUS | Status: DC

## 2017-03-26 MED ORDER — LACTATED RINGERS IV SOLN
INTRAVENOUS | Status: DC
  Administered 2017-03-26 (×3): via INTRAVENOUS

## 2017-03-26 MED ORDER — PHENYLEPHRINE 40 MCG/ML (10ML) SYRINGE FOR IV PUSH (FOR BLOOD PRESSURE SUPPORT)
80.0000 ug | PREFILLED_SYRINGE | INTRAVENOUS | Status: DC | PRN
  Filled 2017-03-26: qty 5
  Filled 2017-03-26: qty 10

## 2017-03-26 MED ORDER — FENTANYL CITRATE (PF) 100 MCG/2ML IJ SOLN
50.0000 ug | INTRAMUSCULAR | Status: DC | PRN
  Administered 2017-03-26: 50 ug via INTRAVENOUS
  Filled 2017-03-26: qty 2

## 2017-03-26 MED ORDER — LACTATED RINGERS IV SOLN
500.0000 mL | Freq: Once | INTRAVENOUS | Status: AC
  Administered 2017-03-26: 500 mL via INTRAVENOUS

## 2017-03-26 MED ORDER — DEXTROSE 5 % IV SOLN
5.0000 10*6.[IU] | Freq: Once | INTRAVENOUS | Status: AC
  Administered 2017-03-26: 5 10*6.[IU] via INTRAVENOUS
  Filled 2017-03-26: qty 5

## 2017-03-26 MED ORDER — OXYCODONE-ACETAMINOPHEN 5-325 MG PO TABS: 1.0000 | ORAL_TABLET | ORAL | Status: DC | PRN

## 2017-03-26 MED ORDER — EPHEDRINE 5 MG/ML INJ
10.0000 mg | INTRAVENOUS | Status: DC | PRN
  Filled 2017-03-26: qty 2
  Filled 2017-03-26: qty 4

## 2017-03-26 MED ORDER — OXYTOCIN 40 UNITS IN LACTATED RINGERS INFUSION - SIMPLE MED
2.5000 [IU]/h | INTRAVENOUS | Status: DC
  Filled 2017-03-26: qty 1000

## 2017-03-26 MED ORDER — OXYCODONE-ACETAMINOPHEN 5-325 MG PO TABS: 2.0000 | ORAL_TABLET | ORAL | Status: DC | PRN

## 2017-03-26 MED ORDER — ACETAMINOPHEN 325 MG PO TABS: 650.0000 mg | ORAL_TABLET | ORAL | Status: DC | PRN

## 2017-03-26 MED ORDER — DIPHENHYDRAMINE HCL 50 MG/ML IJ SOLN: 12.5000 mg | INTRAMUSCULAR | Status: DC | PRN

## 2017-03-26 MED ORDER — PHENYLEPHRINE 40 MCG/ML (10ML) SYRINGE FOR IV PUSH (FOR BLOOD PRESSURE SUPPORT)
80.0000 ug | PREFILLED_SYRINGE | INTRAVENOUS | Status: AC | PRN
  Administered 2017-03-26 (×3): 80 ug via INTRAVENOUS

## 2017-03-26 NOTE — Progress Notes (Signed)
Labor Progress Note [name] is a 21 y.o. G1P0 at 4372w5d presented for labor  S:  Comfortable with epidural.  O:  BP 110/68   Pulse 96   Temp 99 F (37.2 C) (Axillary)   Resp 20   Ht 5\' 3"  (1.6 m)   Wt 105.7 kg (233 lb)   LMP 07/01/2016 (Exact Date)   SpO2 100%   BMI 41.27 kg/m  EFM: baseline 125 bpm/ mod variability/ no accels/ no decels  Toco: 2-3 SVE: Dilation: 7 Effacement (%): 90 Cervical Position: Middle Station: 0 Presentation: Vertex Exam by:: Dr. Omer JackMumaw   A/P: 21 y.o. G1P0 3972w5d  1. Labor: active 2. FWB: Cat i 3. Pain: epidural Anticipate labor progression and SVD.  Donette LarryMelanie Caius Silbernagel, CNM 9:40 PM

## 2017-03-26 NOTE — MAU Note (Signed)
Pt reports contractions every 2-3 mins. Has some bloody mucous. Pt denies LOF. Pt reports good fetal movement. States cervix was 2.5cm last week.

## 2017-03-26 NOTE — Anesthesia Pain Management Evaluation Note (Signed)
  CRNA Pain Management Visit Note  Patient: Lori Short, 21 y.o., female  "Hello I am a member of the anesthesia team at [facility]. We have an anesthesia team available at all times to provide care throughout the hospital, including epidural management and anesthesia for C-section. I don't know your plan for the delivery whether it a natural birth, water birth, IV sedation, nitrous supplementation, doula or epidural, but we want to meet your pain goals."   1.Was your pain managed to your expectations on prior hospitalizations?   No prior hospitalizations  2.What is your expectation for pain management during this hospitalization?     Epidural  3.How can we help you reach that goal? Maintain epidural.  Record the patient's initial score and the patient's pain goal.   Pain: 8, now a 0 with epidural.  Pain Goal: 4 [facility] wants you to be able to say your pain was always managed very well.  [name] 03/26/2017

## 2017-03-26 NOTE — Progress Notes (Signed)
Patient ID: Lori Short, female   DOB: 07/14/1996, 21 y.o.   MRN: [id number] Labor Progress Note  S: Pt seen and examined for labor progression. AROM with clear fluid at 1450.   O:  BP 120/66    Pulse 83    Temp 97.7 F (36.5 C) (Oral)    Resp 18    Ht 5\' 3"  (1.6 m)    Wt 105.7 kg (233 lb)    LMP 07/01/2016 (Exact Date)    SpO2 100%    BMI 41.27 kg/m  FHT: 120bpm, mod var, +accels, no decels TOCO: 1-3 min, patient looks comfortable during contractions CVE: Dilation: 4 Effacement (%): 80 Cervical Position: Middle Station: -2 Presentation: Vertex Exam by:: Marcelino DusterMichelle, RN    A&P: 21 y.o. G1P0 5149w5d   AROM at 14:50 with clear fluid Continue expectant management Anticipate SVD  [place] J Shirley, DO 2:56 PM

## 2017-03-26 NOTE — Anesthesia Preprocedure Evaluation (Signed)
Anesthesia Evaluation  Patient identified by MRN, date of birth, ID band Patient awake    Reviewed: Allergy & Precautions, H&P , Patient's Chart, lab work & pertinent test results  Airway Mallampati: II  TM Distance: >3 FB Neck ROM: full    Dental  (+) Teeth Intact   Pulmonary former smoker,    breath sounds clear to auscultation       Cardiovascular  Rhythm:regular Rate:Normal     Neuro/Psych    GI/Hepatic   Endo/Other  Morbid obesity  Renal/GU      Musculoskeletal   Abdominal   Peds  Hematology  (+) anemia ,   Anesthesia Other Findings       Reproductive/Obstetrics (+) Pregnancy                             Anesthesia Physical Anesthesia Plan  ASA: III  Anesthesia Plan: Epidural   Post-op Pain Management:    Induction:   PONV Risk Score and Plan:   Airway Management Planned:   Additional Equipment:   Intra-op Plan:   Post-operative Plan:   Informed Consent: I have reviewed the patients History and Physical, chart, labs and discussed the procedure including the risks, benefits and alternatives for the proposed anesthesia with the patient or authorized representative who has indicated his/her understanding and acceptance.   Dental Advisory Given  Plan Discussed with:   Anesthesia Plan Comments: (Labs checked- platelets confirmed with RN in room. Fetal heart tracing, per RN, reported to be stable enough for sitting procedure. Discussed epidural, and patient consents to the procedure:  included risk of possible headache,backache, failed block, allergic reaction, and nerve injury. This patient was asked if she had any questions or concerns before the procedure started.)        Anesthesia Quick Evaluation  

## 2017-03-26 NOTE — H&P (Signed)
LABOR ADMISSION HISTORY AND PHYSICAL  Lori Short is a 21 y.o. female G1P0 with IUP at [redacted]w[redacted]d by [place] presenting for SOL. She reports +FMs, SROM, no VB, no blurry vision, headaches, peripheral edema, and RUQ pain.  She plans on breast and bottle feeding. She is undecided for birth control.  Dating: By LMP --->  Estimated Date of Delivery: 04/04/17    Prenatal History/Complications:  Past Medical History: Past Medical History:  Diagnosis Date  . Bronchitis   . Headache     Past Surgical History: Past Surgical History:  Procedure Laterality Date  . NO PAST SURGERIES      Obstetrical History: OB History    Gravida Para Term Preterm AB Living   1             SAB TAB Ectopic Multiple Live Births                  Social History: Social History   Social History  . Marital status: Single    Spouse name: N/A  . Number of children: N/A  . Years of education: N/A   Occupational History  . unemployed    Social History Main Topics  . Smoking status: Former Smoker    Packs/day: 0.25    Years: 1.00    Types: Cigarettes    Quit date: 04/14/2015  . Smokeless tobacco: Former Neurosurgeon  . Alcohol use Yes     Comment: occ  . Drug use: No     Comment: occ, last used May 2016  . Sexual activity: Yes    Partners: Male    Birth control/ protection: None     Comment: implant was to be removed in May   Other Topics Concern  . None   Social History Narrative  . None    Family History: Family History  Problem Relation Age of Onset  . Hypertension Maternal Grandmother   . Heart disease Maternal Grandmother     Allergies: Allergies  Allergen Reactions  . Shellfish Allergy Hives, Nausea And Vomiting and Swelling    Reaction:  Tongue swelling    Prescriptions Prior to Admission  Medication Sig Dispense Refill Last Dose  . acetaminophen (TYLENOL) 500 MG tablet Take 1,000 mg by mouth every 6 (six) hours as needed for mild pain, moderate pain or headache.   03/25/2017 at  Unknown time  . cyclobenzaprine (FLEXERIL) 5 MG tablet Take 1 tablet (5 mg total) by mouth 3 (three) times daily as needed for muscle spasms. 30 tablet 0 03/25/2017 at Unknown time     Review of Systems   All systems reviewed and negative except as stated in HPI  Blood pressure 126/85, pulse 93, temperature 97.8 F (36.6 C), resp. rate 19, height 5\' 3"  (1.6 m), weight 105.7 kg (233 lb), last menstrual period 07/01/2016, SpO2 99 %. General appearance: alert and cooperative Extremities: Homans sign is negative, no sign of DVT Presentation: cephalic Fetal monitoringBaseline: 135 bpm, Variability: Good {> 6 bpm), Accelerations: Reactive and Decelerations: Absent Uterine activityDate/time of onset: 04/03 Dilation: 4.5 Effacement (%): 90 Station: -2, -3 Exam by:: Remigio Eisenmenger RN   Prenatal labs: ABO, Rh: A/Positive/-- (01/30 1556) Antibody: Negative (01/30 1556) Rubella:  RPR: Non Reactive (04/24 1047)  HBsAg: Negative (01/30 1556)  HIV: Non Reactive (04/24 1047)  GBS: Positive (06/11 1418)  Genetic screening  negative  Prenatal Transfer Tool  Maternal Diabetes: No Genetic Screening: Normal Maternal Ultrasounds/Referrals: Normal Fetal Ultrasounds or other Referrals:  MAAC-fetal left UTD  A2- Peds Urology 5/22 Maternal Substance Abuse:  No Significant Maternal Medications:  None Significant Maternal Lab Results: None  Results for orders placed or performed during the hospital encounter of 03/26/17 (from the past 24 hour(s))  CBC   Collection Time: 03/26/17  6:40 AM  Result Value Ref Range   WBC 10.6 (H) 4.0 - 10.5 K/uL   RBC 4.65 3.87 - 5.11 MIL/uL   Hemoglobin 9.0 (L) 12.0 - 15.0 g/dL   HCT 96.030.7 (L) 45.436.0 - 09.846.0 %   MCV 66.0 (L) 78.0 - 100.0 fL   MCH 19.4 (L) 26.0 - 34.0 pg   MCHC 29.3 (L) 30.0 - 36.0 g/dL   RDW 11.918.0 (H) 14.711.5 - 82.915.5 %   Platelets 386 150 - 400 K/uL    Patient Active Problem List   Diagnosis Date Noted  . Normal labor 03/26/2017  . PIH (pregnancy  induced hypertension), third trimester 03/18/2017  . Proteinuria affecting pregnancy in third trimester 03/18/2017  . Edema during pregnancy in third trimester 03/18/2017  . Visual changes 03/18/2017  . GBS bacteriuria 11/29/2016  . Rubella non-immune status, antepartum 11/20/2016  . Substance abuse affecting pregnancy, antepartum 11/20/2016  . 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 Supervision of high risk pregnancy, antepartum 10/23/2016  . Smoker 09/30/2014  . Maternal obesity, antepartum 09/30/2014    Assessment: Lori Short is a 21 y.o. G1P0 at 7080w5d here for SOL with SROM.  #Labor: expectant management  #Pain: Epidural planned #FWB: Cat  #ID: GBS positive #MOF: both #MOC:undecided #Circ:  outpt  [place]Jordan Shirley, DO Family Medicine Resident PGY-1  03/26/2017, 7:13 AM   OB FELLOW HISTORY AND PHYSICAL ATTESTATION  I have seen and examined this patient; I agree with above documentation in the resident's note.    Jen MowElizabeth Titianna Loomis, DO OB Fellow

## 2017-03-26 NOTE — Anesthesia Procedure Notes (Signed)
Epidural Patient location during procedure: OB  Staffing Anesthesiologist: Brentyn Seehafer  Preanesthetic Checklist Completed: patient identified, site marked, surgical consent, pre-op evaluation, timeout performed, IV checked, risks and benefits discussed and monitors and equipment checked  Epidural Patient position: sitting Prep: site prepped and draped and DuraPrep Patient monitoring: continuous pulse ox and blood pressure Approach: midline Location: L3-L4 Injection technique: LOR air  Needle:  Needle type: Tuohy  Needle gauge: 17 G Needle length: 9 cm and 9 Needle insertion depth: 6 cm Catheter type: closed end flexible Catheter size: 19 Gauge Catheter at skin depth: 12 cm Test dose: negative  Assessment Events: blood not aspirated, injection not painful, no injection resistance, negative IV test and no paresthesia  Additional Notes Dosing of Epidural:  1st dose, through catheter .............................................  Xylocaine 40 mg  2nd dose, through catheter, after waiting 3 minutes.........Xylocaine 60 mg    As each dose occurred, patient was free of IV sx; and patient exhibited no evidence of SA injection.  Patient is more comfortable after epidural dosed. Please see RN's note for documentation of vital signs,and FHR which are stable.  Patient reminded not to try to ambulate with numb legs, and that an RN must be present when she attempts to get up.        

## 2017-03-27 ENCOUNTER — Encounter (HOSPITAL_COMMUNITY): Payer: Self-pay

## 2017-03-27 DIAGNOSIS — Z3A38 38 weeks gestation of pregnancy: Secondary | ICD-10-CM

## 2017-03-27 DIAGNOSIS — O134 Gestational [pregnancy-induced] hypertension without significant proteinuria, complicating childbirth: Secondary | ICD-10-CM

## 2017-03-27 DIAGNOSIS — O99824 Streptococcus B carrier state complicating childbirth: Secondary | ICD-10-CM

## 2017-03-27 LAB — RPR: RPR: NONREACTIVE

## 2017-03-27 MED ORDER — TETANUS-DIPHTH-ACELL PERTUSSIS 5-2.5-18.5 LF-MCG/0.5 IM SUSP: 0.5000 mL | Freq: Once | INTRAMUSCULAR | Status: DC

## 2017-03-27 MED ORDER — IBUPROFEN 600 MG PO TABS
600.0000 mg | ORAL_TABLET | Freq: Four times a day (QID) | ORAL | Status: DC
  Administered 2017-03-27 – 2017-03-28 (×6): 600 mg via ORAL
  Filled 2017-03-27 (×6): qty 1

## 2017-03-27 MED ORDER — ACETAMINOPHEN 325 MG PO TABS
650.0000 mg | ORAL_TABLET | ORAL | Status: DC | PRN
  Administered 2017-03-27 (×2): 650 mg via ORAL
  Filled 2017-03-27 (×2): qty 2

## 2017-03-27 MED ORDER — ONDANSETRON HCL 4 MG PO TABS: 4.0000 mg | ORAL_TABLET | ORAL | Status: DC | PRN

## 2017-03-27 MED ORDER — PRENATAL MULTIVITAMIN CH
1.0000 | ORAL_TABLET | Freq: Every day | ORAL | Status: DC
  Administered 2017-03-27 – 2017-03-28 (×2): 1 via ORAL
  Filled 2017-03-27 (×2): qty 1

## 2017-03-27 MED ORDER — SIMETHICONE 80 MG PO CHEW: 80.0000 mg | CHEWABLE_TABLET | ORAL | Status: DC | PRN

## 2017-03-27 MED ORDER — DIPHENHYDRAMINE HCL 25 MG PO CAPS: 25.0000 mg | ORAL_CAPSULE | Freq: Four times a day (QID) | ORAL | Status: DC | PRN

## 2017-03-27 MED ORDER — MISOPROSTOL 200 MCG PO TABS
ORAL_TABLET | ORAL | Status: AC
  Filled 2017-03-27: qty 4

## 2017-03-27 MED ORDER — BENZOCAINE-MENTHOL 20-0.5 % EX AERO
1.0000 "application " | INHALATION_SPRAY | CUTANEOUS | Status: DC | PRN
  Administered 2017-03-27: 1 via TOPICAL
  Filled 2017-03-27: qty 56

## 2017-03-27 MED ORDER — COCONUT OIL OIL
1.0000 "application " | TOPICAL_OIL | Status: DC | PRN
  Administered 2017-03-27: 1 via TOPICAL
  Filled 2017-03-27: qty 120

## 2017-03-27 MED ORDER — MISOPROSTOL 200 MCG PO TABS
800.0000 ug | ORAL_TABLET | Freq: Once | ORAL | Status: AC
  Administered 2017-03-27: 800 ug via RECTAL

## 2017-03-27 MED ORDER — WITCH HAZEL-GLYCERIN EX PADS
1.0000 "application " | MEDICATED_PAD | CUTANEOUS | Status: DC | PRN
  Administered 2017-03-27: 1 via TOPICAL

## 2017-03-27 MED ORDER — SENNOSIDES-DOCUSATE SODIUM 8.6-50 MG PO TABS
2.0000 | ORAL_TABLET | ORAL | Status: DC
  Administered 2017-03-27: 2 via ORAL
  Filled 2017-03-27: qty 2

## 2017-03-27 MED ORDER — MEASLES, MUMPS & RUBELLA VAC ~~LOC~~ INJ
0.5000 mL | INJECTION | Freq: Once | SUBCUTANEOUS | Status: AC
  Administered 2017-03-28: 0.5 mL via SUBCUTANEOUS
  Filled 2017-03-27: qty 0.5

## 2017-03-27 MED ORDER — DIBUCAINE 1 % RE OINT: 1.0000 "application " | TOPICAL_OINTMENT | RECTAL | Status: DC | PRN

## 2017-03-27 MED ORDER — ONDANSETRON HCL 4 MG/2ML IJ SOLN: 4.0000 mg | INTRAMUSCULAR | Status: DC | PRN

## 2017-03-27 NOTE — Anesthesia Postprocedure Evaluation (Signed)
Anesthesia Post Note  Patient: Lori ClockShaniqua L Heidinger  Procedure(s) Performed: * No procedures listed *     Patient location during evaluation: Mother Baby Anesthesia Type: Epidural Level of consciousness: awake, awake and alert, oriented and patient cooperative Pain management: pain level controlled Vital Signs Assessment: post-procedure vital signs reviewed and stable Respiratory status: spontaneous breathing, nonlabored ventilation and respiratory function stable Cardiovascular status: stable Postop Assessment: no headache, no backache, no signs of nausea or vomiting and patient able to bend at knees Anesthetic complications: no    Last Vitals:  Vitals:   03/27/17 0240 03/27/17 0340  BP: 137/60 131/75  Pulse: 98 (!) 107  Resp: 18 17  Temp: 37.5 C 37.3 C    Last Pain:  Vitals:   03/27/17 0740  TempSrc:   PainSc: 0-No pain   Pain Goal:                 [name]

## 2017-03-27 NOTE — Lactation Note (Signed)
This note was copied from a baby's chart. Lactation Consultation Note  Patient Name: Lori Short ZOXWR'UToday's Date: 03/27/2017 Reason for consult: Initial assessment  Baby 11 hours old. Nurse Tech assisted mom with latching baby STS to left breast. Baby latched deeply, suckling rhythmically with a few swallows noted. Enc mom to offer lots of STS and nurse with cues. Reviewed progression of milk coming to volume and supply and demand. Mom had questions about nursing/pumping and returning to work--and all questions answered. [facility], aware of OP/BFSG and LC phone line assistance after D/C.    Maternal Data  Has patient been taught Hand Expression?: Yes (Per mom.) Does the patient have breastfeeding experience prior to this delivery?: No  Feeding Feeding Type: Breast Fed Length of feed:  (LC assess 10 minutes.)  LATCH Score/Interventions Latch: Grasps breast easily, tongue down, lips flanged, rhythmical sucking.  Audible Swallowing: A few with stimulation  Type of Nipple: Everted at rest and after stimulation  Comfort (Breast/Nipple): Soft / non-tender     Hold (Positioning): Assistance needed to correctly position infant at breast and maintain latch. Intervention(s): Breastfeeding basics reviewed;Support Pillows;Position options;Skin to skin  LATCH Score: 8  Lactation Tools Discussed/Used     Consult Status Consult Status: Follow-up Date: 03/28/17 Follow-up type: In-patient    Sherlyn HayJennifer D Nariyah Osias 03/27/2017, 11:59 AM

## 2017-03-28 MED ORDER — IBUPROFEN 600 MG PO TABS: 600.0000 mg | ORAL_TABLET | Freq: Four times a day (QID) | ORAL | 0 refills | Status: DC | PRN

## 2017-03-28 NOTE — Discharge Instructions (Signed)

## 2017-03-28 NOTE — Discharge Summary (Signed)
OB Discharge Summary     Patient Name: Lori Short DOB: [birth year] MRN: [id number]  Date of admission: 03/26/2017 Delivering MD: Donette Larry   Date of discharge: 03/28/2017  Admitting diagnosis: 38 WEEKS CTX Intrauterine pregnancy: [redacted]w[redacted]d     Secondary diagnosis:  Active Problems:   Normal labor  Additional problems: GBS pos      Discharge diagnosis: Term Pregnancy Delivered                                                                                                Post partum procedures:none  Augmentation: AROM  Complications: None  Hospital course:  Onset of Labor With Vaginal Delivery     21 y.o. yo G1P1001 at [redacted]w[redacted]d was admitted in Latent Labor on 03/26/2017. Patient had an uncomplicated labor course as follows:  Membrane Rupture Time/Date: 2:50 PM ,03/26/2017   Intrapartum Procedures: Episiotomy: None [1]                                         Lacerations:     Patient had a delivery of a Viable infant. 03/27/2017  Information for the patient's newborn:  Lori, Short [[id number]]  Delivery Method: Vaginal, Spontaneous Delivery (Filed from Delivery Summary)    Pateint had an uncomplicated postpartum course.  She is ambulating, tolerating a regular diet, passing flatus, and urinating well. Patient is discharged home in stable condition on 03/28/17.   Physical exam  Vitals:   03/27/17 0240 03/27/17 0340 03/27/17 0740 03/28/17 0500  BP: 137/60 131/75 (!) 130/58 126/67  Pulse: 98 (!) 107 97 76  Resp: 18 17 19 16   Temp: 99.5 F (37.5 C) 99.2 F (37.3 C) 98.5 F (36.9 C) 97.8 F (36.6 C)  TempSrc: Oral  Oral Oral  SpO2: 100% 100%  98%  Weight:      Height:       General: alert and cooperative Lochia: appropriate Uterine Fundus: firm Incision: N/A DVT Evaluation: No evidence of DVT seen on physical exam. Labs: Lab Results  Component Value Date   WBC 10.6 (H) 03/26/2017   HGB 9.0 (L) 03/26/2017   HCT 30.7 (L) 03/26/2017   MCV 66.0 (L) 03/26/2017   PLT 386 03/26/2017   CMP Latest Ref Rng & Units 03/18/2017  Glucose 65 - 99 mg/dL 80  BUN 6 - 20 mg/dL 6  Creatinine 5.62 - 1.30 mg/dL 8.65  Sodium 784 - 696 mmol/L 135  Potassium 3.5 - 5.1 mmol/L 4.4  Chloride 101 - 111 mmol/L 107  CO2 22 - 32 mmol/L 19(L)  Calcium 8.9 - 10.3 mg/dL 9.3  Total Protein 6.5 - 8.1 g/dL 7.4  Total Bilirubin 0.3 - 1.2 mg/dL 0.5  Alkaline Phos 38 - 126 U/L 186(H)  AST 15 - 41 U/L 24  ALT 14 - 54 U/L 14    Discharge instruction: per After Visit Summary and "Baby and Me Booklet".  After visit meds:  Allergies as of 03/28/2017      Reactions  Shellfish Allergy Hives, Nausea And Vomiting, Swelling   Reaction:  Tongue swelling      Medication List    STOP taking these medications   acetaminophen 500 MG tablet Commonly known as:  TYLENOL   cyclobenzaprine 5 MG tablet Commonly known as:  FLEXERIL     TAKE these medications   ibuprofen 600 MG tablet Commonly known as:  ADVIL,MOTRIN Take 1 tablet (600 mg total) by mouth every 6 (six) hours as needed.       Diet: routine diet  Activity: Advance as tolerated. Pelvic rest for 6 weeks.   Outpatient follow up:4 weeks Follow up Appt:No future appointments. Follow up Visit:No Follow-up on file.  Postpartum contraception: Undecided  Newborn Data: Live born female  Birth Weight: 8 lb 15.7 oz (4075 g) APGAR: 7, 9  Baby Feeding: Breast Disposition:home with mother   03/28/2017 Cam HaiSHAW, KIMBERLY, CNM  9:20 AM

## 2017-03-29 ENCOUNTER — Ambulatory Visit: Payer: Self-pay

## 2017-03-29 NOTE — Lactation Note (Signed)
This note was copied from a baby's chart. Lactation Consultation Note  Patient Name: Lori Short ZOXWR'UToday's Date: 03/29/2017 Reason for consult: Follow-up assessment Baby at 64 hr of life and dyad set for d/c tonight. The feeding plan is for mom to latch baby, post pump, and bottle feed per volume guidelines. Mom is an active WIC clt and has an apt with them next week. She will f/u with lactation as directed by PCP/pervious lactation feeding plan.   Maternal Data    Feeding Feeding Type: Breast Milk  LATCH Score/Interventions                      Lactation Tools Discussed/Used Pump Review: Setup, frequency, and cleaning;Milk Storage Initiated by:: ES Date initiated:: 03/29/17   Consult Status Consult Status: Complete Follow-up type: Call as needed    Rulon Eisenmengerlizabeth E Angelize Short 03/29/2017, 5:12 PM

## 2017-03-29 NOTE — Lactation Note (Signed)
This note was copied from a baby's chart. Lactation Consultation Note: infant is 3157 hours old and is at 9% weight loss. Mother has been breastfeeding and supplementing infant with finger feeding. Mother assist with a SNS with 8 ml of EBM. Infant took entire amt and sustained latch for 15 mins. Assist mother with assembling pump parts and with pumping. Advised mother to page for staff assistance to give infant first bottle. Mother advised to pump after each feeding and supplement infant with as much as he will take , at least 30 ml after each feeding. [facility]. She has a WIC appt on Tuesday. Mother to page for assistance.   Patient Name: Lori Short ZOXWR'UToday's Date: 03/29/2017 Reason for consult: Follow-up assessment   Maternal Data    Feeding Feeding Type: Breast Fed Length of feed: 30 min  LATCH Score/Interventions                      Lactation Tools Discussed/Used     Consult Status Consult Status: Follow-up Date: 03/29/17 Follow-up type: In-patient    Lori Short, Lori Short 03/29/2017, 10:38 AM

## 2017-04-01 ENCOUNTER — Encounter: Payer: Self-pay | Admitting: Obstetrics

## 2017-04-29 ENCOUNTER — Ambulatory Visit: Payer: Self-pay | Admitting: Obstetrics

## 2017-06-23 ENCOUNTER — Ambulatory Visit (HOSPITAL_COMMUNITY)
Admission: EM | Admit: 2017-06-23 | Discharge: 2017-06-23 | Disposition: A | Discharge status: Home / Self Care | Payer: Medicaid Other | Attending: Urgent Care | Admitting: Urgent Care

## 2017-06-23 ENCOUNTER — Encounter (HOSPITAL_COMMUNITY): Payer: Self-pay | Admitting: Emergency Medicine

## 2017-06-23 DIAGNOSIS — R110 Nausea: Secondary | ICD-10-CM

## 2017-06-23 DIAGNOSIS — Z3202 Encounter for pregnancy test, result negative: Secondary | ICD-10-CM

## 2017-06-23 DIAGNOSIS — R5383 Other fatigue: Secondary | ICD-10-CM | POA: Diagnosis not present

## 2017-06-23 LAB — POCT URINALYSIS DIP (DEVICE)
BILIRUBIN URINE: NEGATIVE
Glucose, UA: NEGATIVE mg/dL
Ketones, ur: NEGATIVE mg/dL
LEUKOCYTES UA: NEGATIVE
NITRITE: NEGATIVE
Protein, ur: NEGATIVE mg/dL
Specific Gravity, Urine: >=1.03 (ref 1.005–1.030)
Urobilinogen, UA: 0.2 mg/dL (ref 0.0–1.0)
pH: 5.5 (ref 5.0–8.0)

## 2017-06-23 LAB — POCT I-STAT, CHEM 8
BUN: 7 mg/dL (ref 6–20)
CALCIUM ION: 1.18 mmol/L (ref 1.15–1.40)
CHLORIDE: 103 mmol/L (ref 101–111)
CREATININE: 0.8 mg/dL (ref 0.44–1.00)
Glucose, Bld: 81 mg/dL (ref 65–99)
HCT: 36 % (ref 36.0–46.0)
Hemoglobin: 12.2 g/dL (ref 12.0–15.0)
POTASSIUM: 3.8 mmol/L (ref 3.5–5.1)
Sodium: 141 mmol/L (ref 135–145)
TCO2: 27 mmol/L (ref 22–32)

## 2017-06-23 LAB — POCT PREGNANCY, URINE: Preg Test, Ur: NEGATIVE

## 2017-06-23 NOTE — ED Triage Notes (Signed)
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 NAD... Ambulatory

## 2017-06-23 NOTE — ED Provider Notes (Signed)
MRN: [id number] DOB: [birth year]  Subjective:   Lori Short is a 21 y.o. female presenting for chief complaint of Fatigue  Reports 1 month history of worsening fatigue, tiredness. Has had associated nausea. Works 3rd shift, has to stand a lot during her shift but is not necessarily strenuous. Sleeps 6 hours per day. Patient eats once daily, snacks while at work. Smokes [employer], is trying to quit smoking. Denies fever, pica, bruising, hematuria, bloody stools, rashes, cough, chest pain, shob, abdominal pain. She is currently on her cycle, is regular.  Lori Short is not currently taking any medications and is allergic to shellfish allergy.  Lori Short  has a past medical history of Bronchitis and Headache. Denies past surgical history.  Objective:   Vitals: Pulse 84   Temp 98.5 F (36.9 C) (Oral)   Resp 20   LMP 06/23/2017   SpO2 100%   Breastfeeding? No   Physical Exam  Constitutional: [name], place, and time. [name].  HENT:  Mouth/Throat: Oropharynx is clear and moist.  Eyes: Pupils are equal, round, and reactive to light. EOM are normal. No scleral icterus.  Neck: Normal range of motion. Neck supple. No thyromegaly present.  Cardiovascular: Normal rate, regular rhythm and intact distal pulses.  Exam reveals no gallop and no friction rub.   No murmur heard. Pulmonary/Chest: No respiratory distress. She has no wheezes. She has no rales.  Abdominal: Soft. Bowel sounds are normal. She exhibits no distension and no mass. There is no tenderness. There is no guarding.  Neurological: [name], place, and time.  Skin: Skin is warm and dry. No rash noted.  Psychiatric: She has a normal mood and affect.    Results for orders placed or performed during the hospital encounter of 06/23/17 (from the past 24 hour(s))  POCT urinalysis dip (device)     Status: Abnormal   Collection Time: 06/23/17  3:55 PM   Result Value Ref Range   Glucose, UA NEGATIVE NEGATIVE mg/dL   Bilirubin Urine NEGATIVE NEGATIVE   Ketones, ur NEGATIVE NEGATIVE mg/dL   Specific Gravity, Urine >=1.030 1.005 - 1.030   Hgb urine dipstick MODERATE (A) NEGATIVE   pH 5.5 5.0 - 8.0   Protein, ur NEGATIVE NEGATIVE mg/dL   Urobilinogen, UA 0.2 0.0 - 1.0 mg/dL   Nitrite NEGATIVE NEGATIVE   Leukocytes, UA NEGATIVE NEGATIVE  I-STAT, chem 8     Status: None   Collection Time: 06/23/17  3:57 PM  Result Value Ref Range   Sodium 141 135 - 145 mmol/L   Potassium 3.8 3.5 - 5.1 mmol/L   Chloride 103 101 - 111 mmol/L   BUN 7 6 - 20 mg/dL   Creatinine, Ser 7.82 0.44 - 1.00 mg/dL   Glucose, Bld 81 65 - 99 mg/dL   Calcium, Ion 9.56 2.13 - 1.40 mmol/L   TCO2 27 22 - 32 mmol/L   Hemoglobin 12.2 12.0 - 15.0 g/dL   HCT 08.6 57.8 - 46.9 %  Pregnancy, urine POC     Status: None   Collection Time: 06/23/17  3:58 PM  Result Value Ref Range   Preg Test, Ur NEGATIVE NEGATIVE    Assessment and Plan :   Fatigue, unspecified type  Nausea without vomiting  Point of care labs and physical exam reassuring. Patient needs to establish care with PCP for further work up. I also discussed the fact that her job is taking more of  a toll than she may have anticipated and she plans on trying to switch to 1st shift. Return-to-clinic precautions discussed, patient verbalized understanding.   Wallis Bamberg, PA-C [facility]  06/23/2017  3:38 PM    Wallis Bamberg, PA-C 06/23/17 2311

## 2017-07-15 ENCOUNTER — Encounter (HOSPITAL_COMMUNITY): Payer: Self-pay | Admitting: *Deleted

## 2017-07-15 ENCOUNTER — Inpatient Hospital Stay (HOSPITAL_COMMUNITY)
Admission: AD | Admit: 2017-07-15 | Discharge: 2017-07-15 | Disposition: A | Discharge status: Home / Self Care | Payer: Medicaid Other | Source: Ambulatory Visit | Attending: Obstetrics and Gynecology | Admitting: Obstetrics and Gynecology

## 2017-07-15 DIAGNOSIS — B9689 Other specified bacterial agents as the cause of diseases classified elsewhere: Secondary | ICD-10-CM | POA: Diagnosis not present

## 2017-07-15 DIAGNOSIS — Z87891 Personal history of nicotine dependence: Secondary | ICD-10-CM | POA: Diagnosis not present

## 2017-07-15 DIAGNOSIS — N760 Acute vaginitis: Secondary | ICD-10-CM

## 2017-07-15 DIAGNOSIS — N898 Other specified noninflammatory disorders of vagina: Secondary | ICD-10-CM | POA: Diagnosis present

## 2017-07-15 LAB — URINALYSIS, ROUTINE W REFLEX MICROSCOPIC
BACTERIA UA: NONE SEEN
Bilirubin Urine: NEGATIVE
GLUCOSE, UA: NEGATIVE mg/dL
Hgb urine dipstick: NEGATIVE
KETONES UR: NEGATIVE mg/dL
Nitrite: NEGATIVE
PROTEIN: NEGATIVE mg/dL
Specific Gravity, Urine: 1.025 (ref 1.005–1.030)
pH: 5 (ref 5.0–8.0)

## 2017-07-15 LAB — WET PREP, GENITAL
Sperm: NONE SEEN
TRICH WET PREP: NONE SEEN
YEAST WET PREP: NONE SEEN

## 2017-07-15 LAB — POCT PREGNANCY, URINE: Preg Test, Ur: NEGATIVE

## 2017-07-15 MED ORDER — METRONIDAZOLE 500 MG PO TABS: 500.0000 mg | ORAL_TABLET | Freq: Two times a day (BID) | ORAL | 0 refills | Status: DC

## 2017-07-15 NOTE — MAU Note (Signed)
+  vaginal irritation/discharge White, thin, malodorous Symptoms x2 weeks  Denies any pains

## 2017-07-15 NOTE — Discharge Instructions (Signed)
Sexually Transmitted Disease  A sexually transmitted disease (STD) is a disease or infection that may be passed (transmitted) from person to person, usually during sexual activity. This may happen by way of saliva, semen, blood, vaginal mucus, or urine. Common STDs include:   Gonorrhea.   Chlamydia.   Syphilis.   HIV and AIDS.   Genital herpes.   Hepatitis B and C.   Trichomonas.   Human papillomavirus (HPV).   Pubic lice.   Scabies.   Mites.   Bacterial vaginosis.    What are the causes?  An STD may be caused by bacteria, a virus, or parasites. STDs are often transmitted during sexual activity if one person is infected. However, they may also be transmitted through nonsexual means. STDs may be transmitted after:   Sexual intercourse with an infected person.   Sharing sex toys with an infected person.   Sharing needles with an infected person or using unclean piercing or tattoo needles.   Having intimate contact with the genitals, mouth, or rectal areas of an infected person.   Exposure to infected fluids during birth.    What are the signs or symptoms?  Different STDs have different symptoms. Some people may not have any symptoms. If symptoms are present, they may include:   Painful or bloody urination.   Pain in the pelvis, abdomen, vagina, anus, throat, or eyes.   A skin rash, itching, or irritation.   Growths, ulcerations, blisters, or sores in the genital and anal areas.   Abnormal vaginal discharge with or without bad odor.   Penile discharge in men.   Fever.   Pain or bleeding during sexual intercourse.   Swollen glands in the groin area.   Yellow skin and eyes (jaundice). This is seen with hepatitis.   Swollen testicles.   Infertility.   Sores and blisters in the mouth.    How is this diagnosed?  To make a diagnosis, your health care provider may:   Take a medical history.   Perform a physical exam.   Take a sample of any discharge to examine.   Swab the throat, cervix,  opening to the penis, rectum, or vagina for testing.   Test a sample of your first morning urine.   Perform blood tests.   Perform a Pap test, if this applies.   Perform a colposcopy.   Perform a laparoscopy.    How is this treated?  Treatment depends on the STD. Some STDs may be treated but not cured.   Chlamydia, gonorrhea, trichomonas, and syphilis can be cured with antibiotic medicine.   Genital herpes, hepatitis, and HIV can be treated, but not cured, with prescribed medicines. The medicines lessen symptoms.   Genital warts from HPV can be treated with medicine or by freezing, burning (electrocautery), or surgery. Warts may come back.   HPV cannot be cured with medicine or surgery. However, abnormal areas may be removed from the cervix, vagina, or vulva.   If your diagnosis is confirmed, your recent sexual partners need treatment. This is true even if they are symptom-free or have a negative culture or evaluation. They should not have sex until their health care providers say it is okay.   Your health care provider may test you for infection again 3 months after treatment.    How is this prevented?  Take these steps to reduce your risk of getting an STD:   Use latex condoms, dental dams, and water-soluble lubricants during sexual activity. Do not use   petroleum jelly or oils.   Avoid having multiple sex partners.   Do not have sex with someone who has other sex partners.   Do not have sex with anyone you do not know or who is at high risk for an STD.   Avoid risky sex practices that can break your skin.   Do not have sex if you have open sores on your mouth or skin.   Avoid drinking too much alcohol or taking illegal drugs. Alcohol and drugs can affect your judgment and put you in a vulnerable position.   Avoid engaging in oral and anal sex acts.   Get vaccinated for HPV and hepatitis. If you have not received these vaccines in the past, talk to your health care provider about whether one or  both might be right for you.   If you are at risk of being infected with HIV, it is recommended that you take a prescription medicine daily to prevent HIV infection. This is called pre-exposure prophylaxis (PrEP). You are considered at risk if:  ? You are a man who has sex with other men (MSM).  ? You are a heterosexual man or woman and are sexually active with more than one partner.  ? You take drugs by injection.  ? You are sexually active with a partner who has HIV.   Talk with your health care provider about whether you are at high risk of being infected with HIV. If you choose to begin PrEP, you should first be tested for HIV. You should then be tested every 3 months for as long as you are taking PrEP.    Contact a health care provider if:   See your health care provider.   Tell your sexual partner(s). They should be tested and treated for any STDs.   Do not have sex until your health care provider says it is okay.  Get help right away if:  Contact your health care provider right away if:   You have severe abdominal pain.   You are a man and notice swelling or pain in your testicles.   You are a woman and notice swelling or pain in your vagina.    This information is not intended to replace advice given to you by your health care provider. Make sure you discuss any questions you have with your health care provider.  Document Released: 12/01/2002 Document Revised: 03/30/2016 Document Reviewed: 03/31/2013  Elsevier Interactive Patient Education  2018 [employer]vier Inc[employer]

## 2017-07-15 NOTE — MAU Provider Note (Signed)
  History     CSN: [id number]  Arrival date and time: 07/15/17 1014   None     Chief Complaint  Patient presents with  . Vaginal Discharge   21 yo G1P1 here for vaginal discharge x 2 weeks. Admits discharge smells "fishy" and is white in color. Patient thinks she has bacterial vaginosis, which she has had before. She is concerned for STI and does not use birth control currently. Denies abdominal pain, fever, sweats, chills.    Past Medical History:  Diagnosis Date  . Bronchitis   . Headache     Past Surgical History:  Procedure Laterality Date  . NO PAST SURGERIES      Family History  Problem Relation Age of Onset  . Hypertension Maternal Grandmother   . Heart disease Maternal Grandmother     Social History  Substance Use Topics  . Smoking status: Former Smoker    Packs/day: 0.25    Years: 1.00    Types: Cigarettes    Quit date: 04/14/2015  . Smokeless tobacco: Former NeurosurgeonUser  . Alcohol use Yes     Comment: occ    Allergies:  Allergies  Allergen Reactions  . Shellfish Allergy Hives, Nausea And Vomiting and Swelling    Reaction:  Tongue swelling    Prescriptions Prior to Admission  Medication Sig Dispense Refill Last Dose  . ibuprofen (ADVIL,MOTRIN) 600 MG tablet Take 1 tablet (600 mg total) by mouth every 6 (six) hours as needed. 30 tablet 0 Unknown at Unknown time    Review of Systems  Constitutional: Negative for chills, fatigue and fever.  HENT: Negative for ear discharge and facial swelling.   Eyes: Negative for discharge and itching.  Respiratory: Negative for choking and chest tightness.   Cardiovascular: Negative for chest pain and palpitations.  Gastrointestinal: Negative for abdominal distention and abdominal pain.  Genitourinary: Positive for vaginal discharge. Negative for dysuria and vaginal bleeding.  Musculoskeletal: Negative for arthralgias and back pain.  Neurological: Negative for dizziness and numbness.   Physical Exam   Blood  pressure 111/65, pulse 83, temperature 97.9 F (36.6 C), temperature source Oral, resp. rate 18, weight 206 lb (93.4 kg), last menstrual period 06/23/2017, SpO2 100 %, not currently breastfeeding.  Physical Exam  Constitutional: [name], place, and time. [name]. No distress.  HENT:  Head: Normocephalic and atraumatic.  Eyes: Pupils are equal, round, and reactive to light. Conjunctivae and EOM are normal.  Neck: Normal range of motion. Neck supple.  Cardiovascular: Normal rate and intact distal pulses.   Respiratory: Effort normal. No respiratory distress.  GI: Soft. She exhibits no distension.  Musculoskeletal: Normal range of motion. She exhibits no edema.  Neurological: [name], place, and time.  Skin: Skin is warm and dry. No erythema.  Psychiatric: She has a normal mood and affect. Her behavior is normal.    MAU Course  Procedures  MDM Gc/chlamycia and wet prep pending.  Assessment and Plan  1. Bacterial vaginosis- treat with flagyl  Aubrea Meixner 07/15/2017, 10:50 AM

## 2017-07-16 LAB — GC/CHLAMYDIA PROBE AMP (~~LOC~~) NOT AT ARMC
CHLAMYDIA, DNA PROBE: NEGATIVE
Neisseria Gonorrhea: NEGATIVE

## 2017-09-21 ENCOUNTER — Other Ambulatory Visit: Payer: Self-pay

## 2017-09-21 ENCOUNTER — Encounter (HOSPITAL_COMMUNITY): Payer: Self-pay | Admitting: Emergency Medicine

## 2017-09-21 ENCOUNTER — Emergency Department (HOSPITAL_COMMUNITY): Payer: Medicaid Other

## 2017-09-21 DIAGNOSIS — K219 Gastro-esophageal reflux disease without esophagitis: Secondary | ICD-10-CM | POA: Diagnosis not present

## 2017-09-21 DIAGNOSIS — Z5321 Procedure and treatment not carried out due to patient leaving prior to being seen by health care provider: Secondary | ICD-10-CM | POA: Diagnosis not present

## 2017-09-21 LAB — CBC
HCT: 32.8 % — ABNORMAL LOW (ref 36.0–46.0)
HEMOGLOBIN: 9.5 g/dL — AB (ref 12.0–15.0)
MCH: 21 pg — ABNORMAL LOW (ref 26.0–34.0)
MCHC: 29 g/dL — ABNORMAL LOW (ref 30.0–36.0)
MCV: 72.6 fL — ABNORMAL LOW (ref 78.0–100.0)
Platelets: 487 10*3/uL — ABNORMAL HIGH (ref 150–400)
RBC: 4.52 MIL/uL (ref 3.87–5.11)
RDW: 17.4 % — ABNORMAL HIGH (ref 11.5–15.5)
WBC: 6.4 10*3/uL (ref 4.0–10.5)

## 2017-09-21 LAB — BASIC METABOLIC PANEL
ANION GAP: 7 (ref 5–15)
BUN: 11 mg/dL (ref 6–20)
CALCIUM: 9 mg/dL (ref 8.9–10.3)
CO2: 26 mmol/L (ref 22–32)
Chloride: 104 mmol/L (ref 101–111)
Creatinine, Ser: 0.77 mg/dL (ref 0.44–1.00)
Glucose, Bld: 107 mg/dL — ABNORMAL HIGH (ref 65–99)
Potassium: 4.3 mmol/L (ref 3.5–5.1)
SODIUM: 137 mmol/L (ref 135–145)

## 2017-09-21 LAB — I-STAT BETA HCG BLOOD, ED (MC, WL, AP ONLY): I-stat hCG, quantitative: <5 m[IU]/mL (ref <5)

## 2017-09-21 LAB — I-STAT TROPONIN, ED: TROPONIN I, POC: 0 ng/mL (ref 0.00–0.08)

## 2017-09-21 NOTE — ED Triage Notes (Signed)
Reports having heartburn since child was born in July every other day or so.  Taking tums at home with no relief.  Also tried mylanta with no help.  During triage reports pain goes into left side of chest under left breast.

## 2017-09-22 ENCOUNTER — Emergency Department (HOSPITAL_COMMUNITY)
Admission: EM | Admit: 2017-09-22 | Discharge: 2017-09-22 | Disposition: A | Discharge status: Home / Self Care | Payer: Medicaid Other | Attending: Emergency Medicine | Admitting: Emergency Medicine

## 2017-09-22 NOTE — ED Notes (Signed)
Called for the third time.  Still no answer.  Removed at this time.

## 2017-09-22 NOTE — ED Notes (Signed)
Pt called to go back to a room. No answer in lobby

## 2017-12-21 ENCOUNTER — Encounter: Payer: Self-pay | Admitting: Emergency Medicine

## 2017-12-21 ENCOUNTER — Emergency Department
Admission: EM | Admit: 2017-12-21 | Discharge: 2017-12-21 | Disposition: A | Discharge status: Home / Self Care | Payer: Medicaid Other | Attending: Emergency Medicine | Admitting: Emergency Medicine

## 2017-12-21 ENCOUNTER — Emergency Department: Payer: Medicaid Other

## 2017-12-21 DIAGNOSIS — R202 Paresthesia of skin: Secondary | ICD-10-CM

## 2017-12-21 DIAGNOSIS — R55 Syncope and collapse: Secondary | ICD-10-CM | POA: Diagnosis not present

## 2017-12-21 DIAGNOSIS — Y929 Unspecified place or not applicable: Secondary | ICD-10-CM | POA: Diagnosis not present

## 2017-12-21 DIAGNOSIS — Y999 Unspecified external cause status: Secondary | ICD-10-CM | POA: Insufficient documentation

## 2017-12-21 DIAGNOSIS — Z79899 Other long term (current) drug therapy: Secondary | ICD-10-CM | POA: Insufficient documentation

## 2017-12-21 DIAGNOSIS — Z87891 Personal history of nicotine dependence: Secondary | ICD-10-CM | POA: Diagnosis not present

## 2017-12-21 DIAGNOSIS — R200 Anesthesia of skin: Secondary | ICD-10-CM | POA: Diagnosis present

## 2017-12-21 DIAGNOSIS — Y939 Activity, unspecified: Secondary | ICD-10-CM | POA: Insufficient documentation

## 2017-12-21 DIAGNOSIS — T148XXA Other injury of unspecified body region, initial encounter: Secondary | ICD-10-CM

## 2017-12-21 DIAGNOSIS — X58XXXA Exposure to other specified factors, initial encounter: Secondary | ICD-10-CM | POA: Insufficient documentation

## 2017-12-21 DIAGNOSIS — H538 Other visual disturbances: Secondary | ICD-10-CM | POA: Insufficient documentation

## 2017-12-21 LAB — CBC
HEMATOCRIT: 34.5 % — AB (ref 35.0–47.0)
Hemoglobin: 10.5 g/dL — ABNORMAL LOW (ref 12.0–16.0)
MCH: 21.1 pg — ABNORMAL LOW (ref 26.0–34.0)
MCHC: 30.3 g/dL — ABNORMAL LOW (ref 32.0–36.0)
MCV: 69.6 fL — AB (ref 80.0–100.0)
Platelets: 500 10*3/uL — ABNORMAL HIGH (ref 150–440)
RBC: 4.96 MIL/uL (ref 3.80–5.20)
RDW: 17.3 % — ABNORMAL HIGH (ref 11.5–14.5)
WBC: 8.3 10*3/uL (ref 3.6–11.0)

## 2017-12-21 LAB — BASIC METABOLIC PANEL
ANION GAP: 8 (ref 5–15)
BUN: 12 mg/dL (ref 6–20)
CHLORIDE: 107 mmol/L (ref 101–111)
CO2: 25 mmol/L (ref 22–32)
Calcium: 9.2 mg/dL (ref 8.9–10.3)
Creatinine, Ser: 0.81 mg/dL (ref 0.44–1.00)
GFR calc non Af Amer: >60 mL/min (ref >60)
Glucose, Bld: 98 mg/dL (ref 65–99)
POTASSIUM: 4.1 mmol/L (ref 3.5–5.1)
Sodium: 140 mmol/L (ref 135–145)

## 2017-12-21 LAB — URINALYSIS, COMPLETE (UACMP) WITH MICROSCOPIC
BILIRUBIN URINE: NEGATIVE
Bacteria, UA: NONE SEEN
GLUCOSE, UA: NEGATIVE mg/dL
KETONES UR: NEGATIVE mg/dL
Nitrite: NEGATIVE
PH: 5 (ref 5.0–8.0)
Protein, ur: NEGATIVE mg/dL
Specific Gravity, Urine: 1.026 (ref 1.005–1.030)

## 2017-12-21 LAB — TROPONIN I: Troponin I: <0.03 ng/mL (ref <0.03)

## 2017-12-21 LAB — POCT PREGNANCY, URINE: Preg Test, Ur: NEGATIVE

## 2017-12-21 NOTE — ED Triage Notes (Signed)
Pt comes into the ED via POV c/o a sharp pain in the right arm and now the arm has mild numbness to it.  Patient states she has also had some blurred vision with it.  Patient states this has been ongoing for a week.  Patient in NAD at this time and is neurologically intact.

## 2017-12-21 NOTE — ED Notes (Signed)
Pt reports pain to right arm (bicep) for the last 2 days tingling to the right hand, dizziness for the last week ("I feel intoxicated"), pt worried about being tired (hx of anemia) and sleeping a lot, pt also reports blurry vision for the last week, pt denies corrective vision or difficulty driving or reading Pt reports taking care of 658 month old child at home and recent death of grandmother, reports that "I may have anxiety"  Pt denies taking meds  CMS intact to right and left hand, strength and reflexes equal bilateral

## 2017-12-21 NOTE — ED Provider Notes (Signed)
[facility] Emergency Department Provider Note  ___________________________________________   First MD Initiated Contact with Patient 12/21/17 0214     (approximate)  I have reviewed the triage vital signs and the nursing notes.   HISTORY  Chief Complaint Numbness   HPI Lori Short is a 22 y.o. female without any chronic medical conditions was presented to the emergency department today with right arm pain as well as numbness over the past 2 days.  She says that over the past week she is also experienced several episodes of feeling dizzy and lightheaded as well as intermittent blurred vision.  Denies any blurred vision or lightheadedness at this time.  Says that she is eating and drinking normally without any nausea vomiting or diarrhea.  Says that she is an 35-month-old child and usually left the child with her right arm which may be causing the irritation.  Says that she became concerned when she looked up signs and symptoms on the Internet and said that they were consistent with heart attack.  Patient denies any drinking or drug use at this time.    Past Medical History:  Diagnosis Date  . Bronchitis   . Headache     Patient Active Problem List   Diagnosis Date Noted  . Normal labor 03/26/2017  . PIH (pregnancy induced hypertension), third trimester 03/18/2017  . Proteinuria affecting pregnancy in third trimester 03/18/2017  . Edema during pregnancy in third trimester 03/18/2017  . Visual changes 03/18/2017  . GBS bacteriuria 11/29/2016  . Rubella non-immune status, antepartum 11/20/2016  . Substance abuse affecting pregnancy, antepartum 11/20/2016  . 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 Supervision of high risk pregnancy, antepartum 10/23/2016  . Smoker 09/30/2014  . Maternal obesity, antepartum 09/30/2014    Past Surgical History:  Procedure Laterality Date  . NO PAST SURGERIES      Prior to Admission medications   Medication Sig Start  Date End Date Taking? Authorizing Provider  calcium carbonate (TUMS - DOSED IN MG ELEMENTAL CALCIUM) 500 MG chewable tablet Chew 1-2 tablets by mouth daily as needed for indigestion or heartburn.    [provider]  metroNIDAZOLE (FLAGYL) 500 MG tablet Take 1 tablet (500 mg total) by mouth 2 (two) times daily. 07/15/17   Moss, Hospital doctor, DO    Allergies Shellfish allergy  Family History  Problem Relation Age of Onset  . Hypertension Maternal Grandmother   . Heart disease Maternal Grandmother     Social History Social History   Tobacco Use  . Smoking status: Former Smoker    Packs/day: 0.25    Years: 1.00    Pack years: 0.25    Types: Cigarettes    Last attempt to quit: 04/14/2015    Years since quitting: 2.6  . Smokeless tobacco: Former Engineer, water Use Topics  . Alcohol use: Yes    Comment: occ  . Drug use: No    Types: Marijuana    Comment: occ, last used May 2016    Review of Systems  Constitutional: No fever/chills Eyes: As above ENT: No sore throat. Cardiovascular: Denies chest pain. Respiratory: Denies shortness of breath. Gastrointestinal: No abdominal pain.  No nausea, no vomiting.  No diarrhea.  No constipation. Genitourinary: Negative for dysuria. Musculoskeletal: Negative for back pain. Skin: Negative for rash. Neurological: Negative for headaches, focal weakness    ____________________________________________   PHYSICAL EXAM:  VITAL SIGNS: ED Triage Vitals [12/21/17 0047]  Enc Vitals Group     BP (!) 141/82  Pulse Rate 87     Resp 16     Temp 98.2 F (36.8 C)     Temp Source Oral     SpO2 100 %     Weight 231 lb (104.8 kg)     Height 5\' 4"  (1.626 m)     Head Circumference      Peak Flow      Pain Score 4     Pain Loc      Pain Edu?      Excl. in GC?     Constitutional: Alert and oriented. Well appearing and in no acute distress. Eyes: Conjunctivae are normal.  EOMI.  PERRLA Head: Atraumatic. Nose: No  congestion/rhinnorhea. Mouth/Throat: Mucous membranes are moist.  Neck: No stridor.   Cardiovascular: Normal rate, regular rhythm. Grossly normal heart sounds.  Good peripheral circulation with equal and bilateral radial pulses. Respiratory: Normal respiratory effort.  No retractions. Lungs CTAB. Gastrointestinal: Soft and nontender. No distention. No CVA tenderness. Musculoskeletal: No lower extremity tenderness nor edema.  No joint effusions.  Soft compartments throughout especially to the bilateral upper extremities.  No erythema, tenderness or induration. Neurologic:  Normal speech and language. No gross focal neurologic deficits are appreciated.  Sensation is intact to light touch the bilateral upper extremities.  Grip strength equal bilateral upper extremities and is 5 out of 5.  5 out of 5 extension as well as flexion of the biceps without any pain with active range of motion. Skin:  Skin is warm, dry and intact. No rash noted. Psychiatric: Mood and affect are normal. Speech and behavior are normal.  ____________________________________________   LABS (all labs ordered are listed, but only abnormal results are displayed)  Labs Reviewed  CBC - Abnormal; Notable for the following components:      Result Value   Hemoglobin 10.5 (*)    HCT 34.5 (*)    MCV 69.6 (*)    MCH 21.1 (*)    MCHC 30.3 (*)    RDW 17.3 (*)    Platelets 500 (*)    All other components within normal limits  URINALYSIS, COMPLETE (UACMP) WITH MICROSCOPIC - Abnormal; Notable for the following components:   Color, Urine YELLOW (*)    APPearance CLOUDY (*)    Hgb urine dipstick SMALL (*)    Leukocytes, UA MODERATE (*)    Squamous Epithelial / LPF 6-30 (*)    All other components within normal limits  BASIC METABOLIC PANEL  TROPONIN I  POCT PREGNANCY, URINE  CBG MONITORING, ED  POC URINE PREG, ED   ____________________________________________  EKG  ED ECG REPORT I, Arelia Longest, the attending  physician, personally viewed and interpreted this ECG.   Date: 12/21/2017  EKG Time: 0050  Rate: 85  Rhythm: normal sinus rhythm  Axis: Normal  Intervals:none  ST&T Change: No ST segment elevation or depression.  No abnormal T wave inversion.  ____________________________________________  RADIOLOGY  CT brain ____________________________________________   PROCEDURES  Procedure(s) performed:   Procedures  Critical Care performed:   ____________________________________________   INITIAL IMPRESSION / ASSESSMENT AND PLAN / ED COURSE  Pertinent labs & imaging results that were available during my care of the patient were reviewed by me and considered in my medical decision making (see chart for details).  DDX: Anemia, MI, anxiety, numbness, paresthesia, muscle strain, near syncope As part of my medical decision making, I reviewed the following data within the electronic MEDICAL RECORD NUMBER Notes from prior ED visits  ----------------------------------------- 4:18  AM on 12/21/2017 -----------------------------------------  Patient without any complaints at this time.  Continues to be without any further complaints of numbness.  Reassuring workup.  We will send urine for urine culture.  However, primary complaint of right upper extremity pain and numbness likely related to overuse from lifting her 6528-month-old.  Do not find any signs of emergent pathology at this time.  Patient will follow up with primary care.  She is understanding of the diagnosis as well as treatment plan willing to comply. ____________________________________________   FINAL CLINICAL IMPRESSION(S) / ED DIAGNOSES  Muscle strain.  Paresthesia.  Blurred vision.  Near syncope.   NEW MEDICATIONS STARTED DURING THIS VISIT:  New Prescriptions   No medications on file     Note:  This document was prepared using Dragon voice recognition software and may include unintentional dictation errors.       Myrna BlazerSchaevitz, Tyera Hansley Matthew, MD 12/21/17 [phone]

## 2017-12-22 LAB — URINE CULTURE

## 2018-01-03 ENCOUNTER — Encounter: Payer: Self-pay | Admitting: Emergency Medicine

## 2018-01-03 ENCOUNTER — Emergency Department: Payer: Medicaid Other

## 2018-01-03 ENCOUNTER — Emergency Department
Admission: EM | Admit: 2018-01-03 | Discharge: 2018-01-03 | Disposition: A | Discharge status: Home / Self Care | Payer: Medicaid Other | Attending: Emergency Medicine | Admitting: Emergency Medicine

## 2018-01-03 DIAGNOSIS — Z87891 Personal history of nicotine dependence: Secondary | ICD-10-CM | POA: Insufficient documentation

## 2018-01-03 DIAGNOSIS — R05 Cough: Secondary | ICD-10-CM

## 2018-01-03 DIAGNOSIS — Z79899 Other long term (current) drug therapy: Secondary | ICD-10-CM | POA: Diagnosis not present

## 2018-01-03 DIAGNOSIS — R079 Chest pain, unspecified: Secondary | ICD-10-CM | POA: Insufficient documentation

## 2018-01-03 DIAGNOSIS — R059 Cough, unspecified: Secondary | ICD-10-CM

## 2018-01-03 LAB — BASIC METABOLIC PANEL
Anion gap: 6 (ref 5–15)
BUN: 8 mg/dL (ref 6–20)
CALCIUM: 8.8 mg/dL — AB (ref 8.9–10.3)
CO2: 24 mmol/L (ref 22–32)
CREATININE: 0.82 mg/dL (ref 0.44–1.00)
Chloride: 105 mmol/L (ref 101–111)
Glucose, Bld: 98 mg/dL (ref 65–99)
Potassium: 3.7 mmol/L (ref 3.5–5.1)
SODIUM: 135 mmol/L (ref 135–145)

## 2018-01-03 LAB — TROPONIN I

## 2018-01-03 LAB — CBC
HCT: 34.7 % — ABNORMAL LOW (ref 35.0–47.0)
Hemoglobin: 10.9 g/dL — ABNORMAL LOW (ref 12.0–16.0)
MCH: 21.9 pg — ABNORMAL LOW (ref 26.0–34.0)
MCHC: 31.5 g/dL — ABNORMAL LOW (ref 32.0–36.0)
MCV: 69.5 fL — ABNORMAL LOW (ref 80.0–100.0)
PLATELETS: 478 10*3/uL — AB (ref 150–440)
RBC: 4.99 MIL/uL (ref 3.80–5.20)
RDW: 16.8 % — ABNORMAL HIGH (ref 11.5–14.5)
WBC: 5.5 10*3/uL (ref 3.6–11.0)

## 2018-01-03 MED ORDER — IPRATROPIUM-ALBUTEROL 0.5-2.5 (3) MG/3ML IN SOLN
3.0000 mL | Freq: Once | RESPIRATORY_TRACT | Status: AC
  Administered 2018-01-03: 3 mL via RESPIRATORY_TRACT
  Filled 2018-01-03: qty 3

## 2018-01-03 MED ORDER — BENZONATATE 100 MG PO CAPS: ORAL_CAPSULE | ORAL | 0 refills | Status: DC

## 2018-01-03 MED ORDER — FLUTICASONE PROPIONATE 50 MCG/ACT NA SUSP: 2.0000 | Freq: Every day | NASAL | 0 refills | Status: DC

## 2018-01-03 MED ORDER — ALBUTEROL SULFATE HFA 108 (90 BASE) MCG/ACT IN AERS: 2.0000 | INHALATION_SPRAY | Freq: Four times a day (QID) | RESPIRATORY_TRACT | 0 refills | Status: AC | PRN

## 2018-01-03 NOTE — Discharge Instructions (Signed)
Your exam, EKG, labs, and chest x-ray are all normal following your evaluation. Take the prescription meds as directed. Consider taking an OTC allergy medicine and cough medicine as needed. [facility] or return as needed.

## 2018-01-03 NOTE — ED Triage Notes (Signed)
Patient presents to the ED with sharp chest pain that began this am.  Patient states pain was in the middle of her chest and radiated to the left side.  Patient states pain is worse with deep breathing.  Patient reports cough and congestion for the past several days.

## 2018-01-03 NOTE — ED Provider Notes (Signed)
[facility] Emergency Department Provider Note ____________________________________________  Time seen: 1159  I have reviewed the triage vital signs and the nursing notes.  HISTORY  Chief Complaint  Chest Pain  HPI Lori Short is a 22 y.o. female presents to the ED for evaluation of intermittent sharp chest pain that she had at this morning.  Patient describes pain was in the middle of her chest and radiates to the left side of her chest wall under her breasts.  She describes the pain was worsened with deep breaths as well as with movement of the left upper extremity.  She also reports that about a week long complaint of intermittent cough and congestion.  He does not take any medications in the interim for her symptoms.  She denies any fevers, chills, sweats, cough-induced vomiting, or hemoptysis.  Denies any diaphoresis, referral of the pain into the jaw or upper extremity.  She does give a remote history of some mild, intermittent, childhood asthma.  Past Medical History:  Diagnosis Date  . Bronchitis   . Headache     Patient Active Problem List   Diagnosis Date Noted  . Normal labor 03/26/2017  . PIH (pregnancy induced hypertension), third trimester 03/18/2017  . Proteinuria affecting pregnancy in third trimester 03/18/2017  . Edema during pregnancy in third trimester 03/18/2017  . Visual changes 03/18/2017  . GBS bacteriuria 11/29/2016  . Rubella non-immune status, antepartum 11/20/2016  . Substance abuse affecting pregnancy, antepartum 11/20/2016  . 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 Supervision of high risk pregnancy, antepartum 10/23/2016  . Smoker 09/30/2014  . Maternal obesity, antepartum 09/30/2014    Past Surgical History:  Procedure Laterality Date  . NO PAST SURGERIES      Prior to Admission medications   Medication Sig Start Date End Date Taking? Authorizing Provider  albuterol (PROVENTIL HFA;VENTOLIN HFA) 108 (90 Base) MCG/ACT  inhaler Inhale 2 puffs into the lungs every 6 (six) hours as needed for wheezing or shortness of breath. 01/03/18   Future Yeldell, Charlesetta IvoryJenise V Bacon, PA-C  benzonatate (TESSALON PERLES) 100 MG capsule Take 1-2 tabs TID prn cough 01/03/18   Tiyah Zelenak, Charlesetta IvoryJenise V Bacon, PA-C  calcium carbonate (TUMS - DOSED IN MG ELEMENTAL CALCIUM) 500 MG chewable tablet Chew 1-2 tablets by mouth daily as needed for indigestion or heartburn.    [provider]  fluticasone (FLONASE) 50 MCG/ACT nasal spray Place 2 sprays into both nostrils daily. 01/03/18   Montia Haslip, Charlesetta IvoryJenise V Bacon, PA-C  metroNIDAZOLE (FLAGYL) 500 MG tablet Take 1 tablet (500 mg total) by mouth 2 (two) times daily. 07/15/17   Moss, Hospital doctorAmber, DO    Allergies Shellfish allergy  Family History  Problem Relation Age of Onset  . Hypertension Maternal Grandmother   . Heart disease Maternal Grandmother     Social History Social History   Tobacco Use  . Smoking status: Former Smoker    Packs/day: 0.25    Years: 1.00    Pack years: 0.25    Types: Cigarettes    Last attempt to quit: 04/14/2015    Years since quitting: 2.7  . Smokeless tobacco: Former Engineer, waterUser  Substance Use Topics  . Alcohol use: Yes    Comment: occ  . Drug use: No    Types: Marijuana    Comment: occ, last used May 2016    Review of Systems  Constitutional: Negative for fever. Eyes: Negative for visual changes. ENT: Negative for sore throat. Cardiovascular: Negative for chest pain. Respiratory: Negative for shortness  of breath.  Mild intermittent cough. Gastrointestinal: Negative for abdominal pain, vomiting and diarrhea. Genitourinary: Negative for dysuria. Musculoskeletal: Negative for back pain.  Chest pain as above. Skin: Negative for rash. Neurological: Negative for headaches, focal weakness or numbness. ____________________________________________  PHYSICAL EXAM:  VITAL SIGNS: ED Triage Vitals  Enc Vitals Group     BP 01/03/18 1019 122/69     Pulse Rate 01/03/18  1019 85     Resp 01/03/18 1019 18     Temp 01/03/18 1019 98.4 F (36.9 C)     Temp Source 01/03/18 1019 Oral     SpO2 01/03/18 1019 99 %     Weight 01/03/18 1018 231 lb (104.8 kg)     Height 01/03/18 1018 5\' 4"  (1.626 m)     Head Circumference --      Peak Flow --      Pain Score 01/03/18 1017 6     Pain Loc --      Pain Edu? --      Excl. in GC? --     Constitutional: Alert and oriented. Well appearing and in no distress. Head: Normocephalic and atraumatic. Eyes: Conjunctivae are normal. PERRL. Normal extraocular movements Ears: Canals clear. TMs intact bilaterally. Nose: No congestion/rhinorrhea/epistaxis. Mouth/Throat: Mucous membranes are moist.  Uvula is midline and tonsils are flat.  No oropharyngeal lesions are appreciated. Neck: Supple. No thyromegaly. Hematological/Lymphatic/Immunological: No cervical lymphadenopathy. Cardiovascular: Normal rate, regular rhythm. Normal distal pulses.  No murmurs, rubs, or gallops noted. Respiratory: Normal respiratory effort. No wheezes/rales/rhonchi. Gastrointestinal: Soft and nontender. No distention. Musculoskeletal: Nontender with normal range of motion in all extremities.  Neurologic:  Normal gait without ataxia. Normal speech and language. No gross focal neurologic deficits are appreciated. Skin:  Skin is warm, dry and intact. No rash noted. ____________________________________________   LABS (pertinent positives/negatives) Labs Reviewed  BASIC METABOLIC PANEL - Abnormal; Notable for the following components:      Result Value   Calcium 8.8 (*)    All other components within normal limits  CBC - Abnormal; Notable for the following components:   Hemoglobin 10.9 (*)    HCT 34.7 (*)    MCV 69.5 (*)    MCH 21.9 (*)    MCHC 31.5 (*)    RDW 16.8 (*)    Platelets 478 (*)    All other components within normal limits  TROPONIN I  ____________________________________________  EKG  NSR 88 bpm No  STEMI ____________________________________________   RADIOLOGY  CXR  Negative  ____________________________________________  PROCEDURES  Procedures DuoNeb x 1 ____________________________________________  INITIAL IMPRESSION / ASSESSMENT AND PLAN / ED COURSE  DDX: AMI, PE, CAP, Musculoskeletal pain, costochondritis  Patient with ED evaluation of nonspecific chest pain.  Patient is reassured by her labs and chest x-ray.  Her troponin is negative and her exam likely represents a noncardiac chest wall pain.  Her chest x-ray did not reveal any acute pneumonia or significant bronchitic changes.  She reports improvement after a DuoNeb treatment.  She will be discharged with prescriptions for albuterol, Tessalon Perles, and Flonase.  She is advised start an over-the-counter allergy medicine as well.  She will follow-up with primary provider or return to the ED as needed. ____________________________________________  FINAL CLINICAL IMPRESSION(S) / ED DIAGNOSES  Final diagnoses:  Nonspecific chest pain  Cough      Efstathios Sawin, [name], PA-C 01/03/18 1701    Minna Antis, MD 01/04/18 1103

## 2018-01-22 ENCOUNTER — Encounter (HOSPITAL_COMMUNITY): Payer: Self-pay | Admitting: *Deleted

## 2018-01-22 ENCOUNTER — Inpatient Hospital Stay (HOSPITAL_COMMUNITY): Payer: Medicaid Other

## 2018-01-22 ENCOUNTER — Inpatient Hospital Stay (HOSPITAL_COMMUNITY)
Admission: AD | Admit: 2018-01-22 | Discharge: 2018-01-22 | Disposition: A | Discharge status: Home / Self Care | Payer: Medicaid Other | Source: Ambulatory Visit | Attending: Obstetrics & Gynecology | Admitting: Obstetrics & Gynecology

## 2018-01-22 DIAGNOSIS — O26899 Other specified pregnancy related conditions, unspecified trimester: Secondary | ICD-10-CM

## 2018-01-22 DIAGNOSIS — R1030 Lower abdominal pain, unspecified: Secondary | ICD-10-CM | POA: Diagnosis present

## 2018-01-22 DIAGNOSIS — B9689 Other specified bacterial agents as the cause of diseases classified elsewhere: Secondary | ICD-10-CM | POA: Diagnosis not present

## 2018-01-22 DIAGNOSIS — Z3A01 Less than 8 weeks gestation of pregnancy: Secondary | ICD-10-CM | POA: Insufficient documentation

## 2018-01-22 DIAGNOSIS — Z87891 Personal history of nicotine dependence: Secondary | ICD-10-CM | POA: Insufficient documentation

## 2018-01-22 DIAGNOSIS — O23591 Infection of other part of genital tract in pregnancy, first trimester: Secondary | ICD-10-CM | POA: Diagnosis not present

## 2018-01-22 DIAGNOSIS — O26891 Other specified pregnancy related conditions, first trimester: Secondary | ICD-10-CM | POA: Diagnosis not present

## 2018-01-22 DIAGNOSIS — O3680X0 Pregnancy with inconclusive fetal viability, not applicable or unspecified: Secondary | ICD-10-CM

## 2018-01-22 DIAGNOSIS — N760 Acute vaginitis: Secondary | ICD-10-CM

## 2018-01-22 DIAGNOSIS — R109 Unspecified abdominal pain: Secondary | ICD-10-CM | POA: Diagnosis not present

## 2018-01-22 LAB — COMPREHENSIVE METABOLIC PANEL
ALK PHOS: 60 U/L (ref 38–126)
ALT: 15 U/L (ref 14–54)
AST: 17 U/L (ref 15–41)
Albumin: 3.7 g/dL (ref 3.5–5.0)
Anion gap: 8 (ref 5–15)
BILIRUBIN TOTAL: 0.2 mg/dL — AB (ref 0.3–1.2)
BUN: 7 mg/dL (ref 6–20)
CALCIUM: 9.3 mg/dL (ref 8.9–10.3)
CO2: 23 mmol/L (ref 22–32)
Chloride: 106 mmol/L (ref 101–111)
Creatinine, Ser: 0.67 mg/dL (ref 0.44–1.00)
GFR calc Af Amer: >60 mL/min (ref >60)
GFR calc non Af Amer: >60 mL/min (ref >60)
Glucose, Bld: 94 mg/dL (ref 65–99)
Potassium: 4.5 mmol/L (ref 3.5–5.1)
SODIUM: 137 mmol/L (ref 135–145)
TOTAL PROTEIN: 7.5 g/dL (ref 6.5–8.1)

## 2018-01-22 LAB — URINALYSIS, ROUTINE W REFLEX MICROSCOPIC
Bacteria, UA: NONE SEEN
Bilirubin Urine: NEGATIVE
Glucose, UA: NEGATIVE mg/dL
Hgb urine dipstick: NEGATIVE
Ketones, ur: NEGATIVE mg/dL
Nitrite: NEGATIVE
PROTEIN: NEGATIVE mg/dL
Specific Gravity, Urine: 1.019 (ref 1.005–1.030)
pH: 7 (ref 5.0–8.0)

## 2018-01-22 LAB — CBC WITH DIFFERENTIAL/PLATELET
BASOS ABS: 0 10*3/uL (ref 0.0–0.1)
BASOS PCT: 0 %
Eosinophils Absolute: 0.2 10*3/uL (ref 0.0–0.7)
Eosinophils Relative: 3 %
HEMATOCRIT: 32.6 % — AB (ref 36.0–46.0)
HEMOGLOBIN: 10 g/dL — AB (ref 12.0–15.0)
Lymphocytes Relative: 25 %
Lymphs Abs: 2.3 10*3/uL (ref 0.7–4.0)
MCH: 21.9 pg — ABNORMAL LOW (ref 26.0–34.0)
MCHC: 30.7 g/dL (ref 30.0–36.0)
MCV: 71.5 fL — ABNORMAL LOW (ref 78.0–100.0)
MONOS PCT: 3 %
Monocytes Absolute: 0.2 10*3/uL (ref 0.1–1.0)
NEUTROS ABS: 6.4 10*3/uL (ref 1.7–7.7)
NEUTROS PCT: 69 %
Platelets: 461 10*3/uL — ABNORMAL HIGH (ref 150–400)
RBC: 4.56 MIL/uL (ref 3.87–5.11)
RDW: 16.6 % — ABNORMAL HIGH (ref 11.5–15.5)
WBC: 9.2 10*3/uL (ref 4.0–10.5)

## 2018-01-22 LAB — WET PREP, GENITAL
SPERM: NONE SEEN
Trich, Wet Prep: NONE SEEN
Yeast Wet Prep HPF POC: NONE SEEN

## 2018-01-22 LAB — HCG, QUANTITATIVE, PREGNANCY: hCG, Beta Chain, Quant, S: 10702 m[IU]/mL — ABNORMAL HIGH (ref <5)

## 2018-01-22 LAB — ABO/RH: ABO/RH(D): A POS

## 2018-01-22 LAB — POCT PREGNANCY, URINE: Preg Test, Ur: POSITIVE — AB

## 2018-01-22 MED ORDER — METRONIDAZOLE 500 MG PO TABS: 500.0000 mg | ORAL_TABLET | Freq: Two times a day (BID) | ORAL | 0 refills | Status: DC

## 2018-01-22 NOTE — Discharge Instructions (Signed)

## 2018-01-22 NOTE — MAU Note (Signed)
Pt reports lower abd cramping, LMP 12/09/2017.

## 2018-01-22 NOTE — MAU Provider Note (Signed)
History     CSN: [id number]  Arrival date and time: 01/22/18 1508   First Provider Initiated Contact with Patient 01/22/18 1637      Chief Complaint  Patient presents with  . Abdominal Pain  . Possible Pregnancy   HPI   Lori Short is a 21 y.o. female G2P1001 @ 3w2dhere in MAU with lower abdominal pain. Says she missed her period and suspected she was pregnant however was not sure. The pain is in her lower abdomen; both sides. The pain started 2 weeks ago. The pain comes and goes. No bleeding. Says she has not tried anything for the pain. Not a desired pregnancy, however she is happy.   OB History    Gravida  2   Para  1   Term  1   Preterm      AB      Living  1     SAB      TAB      Ectopic      Multiple  0   Live Births  1           Past Medical History:  Diagnosis Date  . Bronchitis   . Headache     Past Surgical History:  Procedure Laterality Date  . NO PAST SURGERIES      Family History  Problem Relation Age of Onset  . Hypertension Maternal Grandmother   . Heart disease Maternal Grandmother     Social History   Tobacco Use  . Smoking status: Former Smoker    Packs/day: 0.25    Years: 1.00    Pack years: 0.25    Types: Cigarettes    Last attempt to quit: 04/14/2015    Years since quitting: 2.7  . Smokeless tobacco: Former UNetwork engineerUse Topics  . Alcohol use: Yes    Comment: occasional   . Drug use: No    Allergies:  Allergies  Allergen Reactions  . Shellfish Allergy Hives, Nausea And Vomiting and Swelling    Reaction:  Tongue swelling    Medications Prior to Admission  Medication Sig Dispense Refill Last Dose  . albuterol (PROVENTIL HFA;VENTOLIN HFA) 108 (90 Base) MCG/ACT inhaler Inhale 2 puffs into the lungs every 6 (six) hours as needed for wheezing or shortness of breath. 1 Inhaler 0   . benzonatate (TESSALON PERLES) 100 MG capsule Take 1-2 tabs TID prn cough (Patient not taking: Reported on 01/22/2018) 30  capsule 0 Not Taking at Unknown time  . calcium carbonate (TUMS - DOSED IN MG ELEMENTAL CALCIUM) 500 MG chewable tablet Chew 1-2 tablets by mouth daily as needed for indigestion or heartburn.   07/14/2017 at Unknown time  . fluticasone (FLONASE) 50 MCG/ACT nasal spray Place 2 sprays into both nostrils daily. (Patient not taking: Reported on 01/22/2018) 16 g 0 Not Taking at Unknown time  . metroNIDAZOLE (FLAGYL) 500 MG tablet Take 1 tablet (500 mg total) by mouth 2 (two) times daily. (Patient not taking: Reported on 01/22/2018) 14 tablet 0 Not Taking at Unknown time   Results for orders placed or performed during the hospital encounter of 01/22/18 (from the past 48 hour(s))  Urinalysis, Routine w reflex microscopic     Status: Abnormal   Collection Time: 01/22/18  3:27 PM  Result Value Ref Range   Color, Urine YELLOW YELLOW   APPearance CLEAR CLEAR   Specific Gravity, Urine 1.019 1.005 - 1.030   pH 7.0 5.0 - 8.0   Glucose,  UA NEGATIVE NEGATIVE mg/dL   Hgb urine dipstick NEGATIVE NEGATIVE   Bilirubin Urine NEGATIVE NEGATIVE   Ketones, ur NEGATIVE NEGATIVE mg/dL   Protein, ur NEGATIVE NEGATIVE mg/dL   Nitrite NEGATIVE NEGATIVE   Leukocytes, UA TRACE (A) NEGATIVE   RBC / HPF 0-5 0 - 5 RBC/hpf   WBC, UA 6-10 0 - 5 WBC/hpf   Bacteria, UA NONE SEEN NONE SEEN   Squamous Epithelial / LPF 0-5 0 - 5    Comment: Please note change in reference range.   Mucus PRESENT     Comment: Performed at [facility], [address], [place], [place] [address]  Pregnancy, urine POC     Status: Abnormal   Collection Time: 01/22/18  3:39 PM  Result Value Ref Range   Preg Test, Ur POSITIVE (A) NEGATIVE    Comment:        THE SENSITIVITY OF THIS METHODOLOGY IS >24 mIU/mL   Wet prep, genital     Status: Abnormal   Collection Time: 01/22/18  3:56 PM  Result Value Ref Range   Yeast Wet Prep HPF POC NONE SEEN NONE SEEN   Trich, Wet Prep NONE SEEN NONE SEEN   Clue Cells Wet Prep HPF POC PRESENT (A) NONE SEEN    WBC, Wet Prep HPF POC MANY (A) NONE SEEN   Sperm NONE SEEN     Comment: Performed at [facility], [address] [place], [place] [address]  CBC with Differential/Platelet     Status: Abnormal   Collection Time: 01/22/18  4:02 PM  Result Value Ref Range   WBC 9.2 4.0 - 10.5 K/uL   RBC 4.56 3.87 - 5.11 MIL/uL   Hemoglobin 10.0 (L) 12.0 - 15.0 g/dL   HCT 32.6 (L) 36.0 - 46.0 %   MCV 71.5 (L) 78.0 - 100.0 fL   MCH 21.9 (L) 26.0 - 34.0 pg   MCHC 30.7 30.0 - 36.0 g/dL   RDW 16.6 (H) 11.5 - 15.5 %   Platelets 461 (H) 150 - 400 K/uL   Neutrophils Relative % 69 %   Neutro Abs 6.4 1.7 - 7.7 K/uL   Lymphocytes Relative 25 %   Lymphs Abs 2.3 0.7 - 4.0 K/uL   Monocytes Relative 3 %   Monocytes Absolute 0.2 0.1 - 1.0 K/uL   Eosinophils Relative 3 %   Eosinophils Absolute 0.2 0.0 - 0.7 K/uL   Basophils Relative 0 %   Basophils Absolute 0.0 0.0 - 0.1 K/uL    Comment: Performed at [facility], [address], [place],  [address]  Comprehensive metabolic panel     Status: Abnormal   Collection Time: 01/22/18  4:02 PM  Result Value Ref Range   Sodium 137 135 - 145 mmol/L   Potassium 4.5 3.5 - 5.1 mmol/L   Chloride 106 101 - 111 mmol/L   CO2 23 22 - 32 mmol/L   Glucose, Bld 94 65 - 99 mg/dL   BUN 7 6 - 20 mg/dL   Creatinine, Ser 0.67 0.44 - 1.00 mg/dL   Calcium 9.3 8.9 - 10.3 mg/dL   Total Protein 7.5 6.5 - 8.1 g/dL   Albumin 3.7 3.5 - 5.0 g/dL   AST 17 15 - 41 U/L   ALT 15 14 - 54 U/L   Alkaline Phosphatase 60 38 - 126 U/L   Total Bilirubin 0.2 (L) 0.3 - 1.2 mg/dL   GFR calc non Af Amer >60 >60 mL/min   GFR calc Af Amer >60 >60  mL/min    Comment: (NOTE) The eGFR has been calculated using the CKD EPI equation. This calculation has not been validated in all clinical situations. eGFR's persistently <60 mL/min signify possible Chronic Kidney Disease.    Anion gap 8 5 - 15    Comment: Performed at [facility], [address], [place], [place] [address]  ABO/Rh      Status: None   Collection Time: 01/22/18  4:02 PM  Result Value Ref Range   ABO/RH(D)      A POS Performed at [facility], [address] [place], [place] [address]   hCG, quantitative, pregnancy     Status: Abnormal   Collection Time: 01/22/18  4:02 PM  Result Value Ref Range   hCG, Beta Chain, Quant, S 10,702 (H) <5 mIU/mL    Comment:          GEST. AGE      CONC.  (mIU/mL)   <=1 WEEK        5 - 50     2 WEEKS       50 - 500     3 WEEKS       100 - 10,000     4 WEEKS     1,000 - 30,000     5 WEEKS     3,500 - 115,000   6-8 WEEKS     12,000 - 270,000    12 WEEKS     15,000 - 220,000        FEMALE AND NON-PREGNANT FEMALE:     LESS THAN 5 mIU/mL Performed at [facility], [address] [place], [place] [address]    US Ob Less Than 14 Weeks With Ob Transvaginal  Result Date: 01/22/2018 CLINICAL DATA:  Abdominal pain and cramping. Gestational age by LMP of 6 weeks 2 days. EXAM: OBSTETRIC <14 WK [place] AND TRANSVAGINAL OB US TECHNIQUE: Both transabdominal and transvaginal ultrasound examinations were performed for complete evaluation of the gestation as well as the maternal uterus, adnexal regions, and pelvic cul-de-sac. Transvaginal technique was performed to assess early pregnancy. COMPARISON:  None. FINDINGS: Intrauterine gestational sac: Single, with double decidual sac sign Yolk sac:  Not Visualized. Embryo:  Not Visualized. MSD: 8 mm   5 w   3 d Subchorionic hemorrhage:  None visualized. Maternal uterus/adnexae: Normal appearance of both ovaries. No adnexal mass or abnormal free fluid. IMPRESSION: Single intrauterine gestational sac, measuring 5 weeks 3 days by mean sac diameter. Consider following serial b-hCG levels, with followup ultrasound to assess viability in 14 days. No adnexal mass identified. Electronically Signed   By: Earle Gell M.D.   On: 01/22/2018 17:35   Review of Systems  Gastrointestinal: Positive for abdominal pain, nausea and vomiting.  Genitourinary:  Negative for dysuria, vaginal bleeding and vaginal discharge.   Physical Exam   Blood pressure 119/73, pulse (!) 102, temperature 99.1 F (37.3 C), temperature source Oral, resp. rate 16, height '5\' 4"'  (1.626 m), weight 229 lb (103.9 kg), last menstrual period 12/09/2017, SpO2 100 %, not currently breastfeeding.  Physical Exam  Constitutional: [name], place, and time. [name]. No distress.  HENT:  Head: Normocephalic.  Eyes: Pupils are equal, round, and reactive to light.  Respiratory: Effort normal.  GI: Soft. She exhibits no distension. There is no tenderness. There is no rebound and no guarding.  Genitourinary:  Genitourinary Comments: Vagina - Small amount of white vaginal discharge, + odor  Cervix - No  contact bleeding, no active bleeding  Bimanual exam: Cervix closed Uterus non tender, normal size Adnexa non tender, no masses bilaterally GC/Chlam, wet prep done Chaperone present for exam.   Musculoskeletal: Normal range of motion.  Neurological: [name], place, and time.  Skin: Skin is warm. She is not diaphoretic.  Psychiatric: Her behavior is normal.    MAU Course  Procedures  None  MDM  Wet prep & GC HIV, CBC, Hcg, ABO US OB transvaginal   A positive blood type   Assessment and Plan   A:  1. Pregnancy of unknown anatomic location   2. Abdominal pain in pregnancy   3. [redacted] weeks gestation of pregnancy   4. BV (bacterial vaginosis)     P:  Discharge home with strict return precautions Rx: Flagyl  Return to MAU on Friday 5/3 for repeat Quant Ectopic precautions Pelvic rest Return to MAU if symptoms worsen  Lori Short, Anderson Malta I, NP 01/22/2018 7:01 PM

## 2018-01-23 LAB — GC/CHLAMYDIA PROBE AMP (~~LOC~~) NOT AT ARMC
CHLAMYDIA, DNA PROBE: POSITIVE — AB
NEISSERIA GONORRHEA: NEGATIVE

## 2018-01-24 LAB — CULTURE, OB URINE: Special Requests: NORMAL

## 2018-01-26 ENCOUNTER — Other Ambulatory Visit: Payer: Self-pay

## 2018-01-26 ENCOUNTER — Inpatient Hospital Stay (HOSPITAL_COMMUNITY)
Admission: AD | Admit: 2018-01-26 | Discharge: 2018-01-26 | Discharge status: Against Medical Advice | Payer: Medicaid Other | Source: Ambulatory Visit | Attending: Obstetrics & Gynecology | Admitting: Obstetrics & Gynecology

## 2018-01-26 DIAGNOSIS — Z5321 Procedure and treatment not carried out due to patient leaving prior to being seen by health care provider: Secondary | ICD-10-CM | POA: Insufficient documentation

## 2018-01-26 DIAGNOSIS — Z3687 Encounter for antenatal screening for uncertain dates: Secondary | ICD-10-CM | POA: Diagnosis present

## 2018-01-26 LAB — HCG, QUANTITATIVE, PREGNANCY: hCG, Beta Chain, Quant, S: 29847 m[IU]/mL — ABNORMAL HIGH (ref <5)

## 2018-01-26 NOTE — MAU Note (Signed)
Pt not in lobby.  

## 2018-01-26 NOTE — Discharge Instructions (Signed)
Ectopic Pregnancy An ectopic pregnancy is when the fertilized egg attaches (implants) outside the uterus. Most ectopic pregnancies occur in one of the tubes where eggs travel from the ovary to the uterus (fallopian tubes), but the implanting can occur in other locations. In rare cases, ectopic pregnancies occur on the ovary, intestine, pelvis, abdomen, or cervix. In an ectopic pregnancy, the fertilized egg does not have the ability to develop into a normal, healthy baby. A ruptured ectopic pregnancy is one in which tearing or bursting of a fallopian tube causes internal bleeding. Often, there is intense lower abdominal pain, and vaginal bleeding sometimes occurs. Having an ectopic pregnancy can be life-threatening. If this dangerous condition is not treated, it can lead to blood loss, shock, or even death. What are the causes? The most common cause of this condition is damage to one of the fallopian tubes. A fallopian tube may be narrowed or blocked, and that keeps the fertilized egg from reaching the uterus. What increases the risk? This condition is more likely to develop in women of childbearing age who have different levels of risk. The levels of risk can be divided into three categories. High risk  You have gone through infertility treatment.  You have had an ectopic pregnancy before.  You have had surgery on the fallopian tubes, or another surgical procedure, such as an abortion.  You have had surgery to have the fallopian tubes tied (tubal ligation).  You have problems or diseases of the fallopian tubes.  You have been exposed to diethylstilbestrol (DES). This medicine was used until 1971, and it had effects on babies whose mothers took the medicine.  You become pregnant while using an IUD (intrauterine device) for birth control. Moderate risk  You have a history of infertility.  You have had an STI (sexually transmitted infection).  You have a history of pelvic inflammatory  disease (PID).  You have scarring from endometriosis.  You have multiple sexual partners.  You smoke. Low risk  You have had pelvic surgery.  You use vaginal douches.  You became sexually active before age 18. What are the signs or symptoms? Common symptoms of this condition include normal pregnancy symptoms, such as missing a period, nausea, tiredness, abdominal pain, breast tenderness, and bleeding. However, ectopic pregnancy will have additional symptoms, such as:  Pain with intercourse.  Irregular vaginal bleeding or spotting.  Cramping or pain on one side or in the lower abdomen.  Fast heartbeat, low blood pressure, and sweating.  Passing out while having a bowel movement.  Symptoms of a ruptured ectopic pregnancy and internal bleeding may include:  Sudden, severe pain in the abdomen and pelvis.  Dizziness, weakness, light-headedness, or fainting.  Pain in the shoulder or neck area.  How is this diagnosed? This condition is diagnosed by:  A pelvic exam to locate pain or a mass in the abdomen.  A pregnancy test. This blood test checks for the presence as well as the specific level of pregnancy hormone in the bloodstream.  Ultrasound. This is performed if a pregnancy test is positive. In this test, a probe is inserted into the vagina. The probe will detect a fetus, possibly in a location other than the uterus.  Taking a sample of uterus tissue (dilation and curettage, or D&C).  Surgery to perform a visual exam of the inside of the abdomen using a thin, lighted tube that has a tiny camera on the end (laparoscope).  Culdocentesis. This procedure involves inserting a needle at the top   of the vagina, behind the uterus. If blood is present in this area, it may indicate that a fallopian tube is torn.  How is this treated? This condition is treated with medicine or surgery. Medicine  An injection of a medicine (methotrexate) may be given to cause the pregnancy tissue  to be absorbed. This medicine may save your fallopian tube. It may be given if: ? The diagnosis is made early, with no signs of active bleeding. ? The fallopian tube has not ruptured. ? You are considered to be a good candidate for the medicine. Usually, pregnancy hormone blood levels are checked after methotrexate treatment. This is to be sure that the medicine is effective. It may take 4-6 weeks for the pregnancy to be absorbed. Most pregnancies will be absorbed by 3 weeks. Surgery  A laparoscope may be used to remove the pregnancy tissue.  If severe internal bleeding occurs, a larger cut (incision) may be made in the lower abdomen (laparotomy) to remove the fetus and placenta. This is done to stop the bleeding.  Part or all of the fallopian tube may be removed (salpingectomy) along with the fetus and placenta. The fallopian tube may also be repaired during the surgery.  In very rare circumstances, removal of the uterus (hysterectomy) may be required.  After surgery, pregnancy hormone testing may be done to be sure that there is no pregnancy tissue left. Whether your treatment is medicine or surgery, you may receive a Rho (D) immune globulin shot to prevent problems with any future pregnancy. This shot may be given if:  You are Rh-negative and the baby's father is Rh-positive.  You are Rh-negative and you do not know the Rh type of the baby's father.  Follow these instructions at home:  Rest and limit your activity after the procedure for as long as told by your health care provider.  Until your health care provider says that it is safe: ? Do not lift anything that is heavier than 10 lb (4.5 kg), or the limit that your health care provider tells you. ? Avoid physical exercise and any movement that requires effort (is strenuous).  To help prevent constipation: ? Eat a healthy diet that includes fruits, vegetables, and whole grains. ? Drink 6-8 glasses of water per day. Get help  right away if:  You develop worsening pain that is not relieved by medicine.  You have: ? A fever or chills. ? Vaginal bleeding. ? Redness and swelling at the incision site. ? Nausea and vomiting.  You feel dizzy or weak.  You feel light-headed or you faint. This information is not intended to replace advice given to you by your health care provider. Make sure you discuss any questions you have with your health care provider. Document Released: 10/18/2004 Document Revised: 05/09/2016 Document Reviewed: 04/11/2016 Elsevier Interactive Patient Education  2018 [employer]  

## 2018-01-26 NOTE — MAU Note (Signed)
"  wasn't able to make it", no reason stated, just couldn't make it on Friday.  Been having some cramping, nauseated. No bleeding.

## 2018-01-26 NOTE — MAU Provider Note (Signed)
Patient called and she was not in the lobby. Unable to review results with the patient. Patient left AMA.  Thressa Sheller 8:39 PM 01/26/18

## 2018-01-26 NOTE — MAU Note (Signed)
Not in lobby? With lab

## 2018-01-28 ENCOUNTER — Telehealth: Payer: Self-pay | Admitting: Student

## 2018-01-28 DIAGNOSIS — A749 Chlamydial infection, unspecified: Secondary | ICD-10-CM

## 2018-01-28 MED ORDER — AZITHROMYCIN 500 MG PO TABS: 1000.0000 mg | ORAL_TABLET | Freq: Once | ORAL | 0 refills | Status: AC

## 2018-01-28 NOTE — Telephone Encounter (Addendum)
Lori Short tested positive for  Chlamydia. Patient was called by RN and allergies and pharmacy confirmed. Rx sent to pharmacy of choice.   Judeth Horn, NP 01/28/2018 2:42 PM       ----- Message from Kathe Becton, RN sent at 01/28/2018  1:01 PM EDT ----- This patient tested positive for :  chlamydia  She ::"has NKDA", I have informed the patient of her results and confirmed her pharmacy is correct in her chart. Please send Rx.   Thank you,   Kathe Becton, RN   Results faxed to [facility] Department.

## 2018-03-12 ENCOUNTER — Other Ambulatory Visit (HOSPITAL_COMMUNITY)
Admission: RE | Admit: 2018-03-12 | Discharge: 2018-03-12 | Disposition: A | Discharge status: Home / Self Care | Payer: Medicaid Other | Source: Ambulatory Visit | Attending: Student | Admitting: Student

## 2018-03-12 ENCOUNTER — Encounter: Payer: Self-pay | Admitting: Student

## 2018-03-12 ENCOUNTER — Ambulatory Visit (INDEPENDENT_AMBULATORY_CARE_PROVIDER_SITE_OTHER): Payer: Medicaid Other | Admitting: Student

## 2018-03-12 VITALS — BP 105/68 | HR 102 | Wt 219.4 lb

## 2018-03-12 DIAGNOSIS — O0990 Supervision of high risk pregnancy, unspecified, unspecified trimester: Secondary | ICD-10-CM | POA: Diagnosis not present

## 2018-03-12 DIAGNOSIS — O1220 Gestational edema with proteinuria, unspecified trimester: Secondary | ICD-10-CM

## 2018-03-12 DIAGNOSIS — Z8759 Personal history of other complications of pregnancy, childbirth and the puerperium: Secondary | ICD-10-CM

## 2018-03-12 DIAGNOSIS — Z3480 Encounter for supervision of other normal pregnancy, unspecified trimester: Secondary | ICD-10-CM | POA: Insufficient documentation

## 2018-03-12 DIAGNOSIS — O0992 Supervision of high risk pregnancy, unspecified, second trimester: Secondary | ICD-10-CM

## 2018-03-12 DIAGNOSIS — O1213 Gestational proteinuria, third trimester: Secondary | ICD-10-CM

## 2018-03-12 DIAGNOSIS — Z3482 Encounter for supervision of other normal pregnancy, second trimester: Secondary | ICD-10-CM

## 2018-03-12 MED ORDER — PRENATAL VITAMINS 0.8 MG PO TABS: 1.0000 | ORAL_TABLET | Freq: Every day | ORAL | 12 refills | Status: DC

## 2018-03-12 MED ORDER — ASPIRIN EC 81 MG PO TBEC: 81.0000 mg | DELAYED_RELEASE_TABLET | Freq: Every day | ORAL | 1 refills | Status: DC

## 2018-03-12 MED ORDER — ASPIRIN EC 81 MG PO TBEC: 81.0000 mg | DELAYED_RELEASE_TABLET | Freq: Every day | ORAL | 2 refills | Status: DC

## 2018-03-12 NOTE — Patient Instructions (Signed)
First Trimester of Pregnancy The first trimester of pregnancy is from week 1 until the end of week 13 (months 1 through 3). During this time, your baby will begin to develop inside you. At 6-8 weeks, the eyes and face are formed, and the heartbeat can be seen on ultrasound. At the end of 12 weeks, all the baby's organs are formed. Prenatal care is all the medical care you receive before the birth of your baby. Make sure you get good prenatal care and follow all of your doctor's instructions. Follow these instructions at home: Medicines  Take over-the-counter and prescription medicines only as told by your doctor. Some medicines are safe and some medicines are not safe during pregnancy.  Take a prenatal vitamin that contains at least 600 micrograms (mcg) of folic acid.  If you have trouble pooping (constipation), take medicine that will make your stool soft (stool softener) if your doctor approves. Eating and drinking  Eat regular, healthy meals.  Your doctor will tell you the amount of weight gain that is right for you.  Avoid raw meat and uncooked cheese.  If you feel sick to your stomach (nauseous) or throw up (vomit): ? Eat 4 or 5 small meals a day instead of 3 large meals. ? Try eating a few soda crackers. ? Drink liquids between meals instead of during meals.  To prevent constipation: ? Eat foods that are high in fiber, like fresh fruits and vegetables, whole grains, and beans. ? Drink enough fluids to keep your pee (urine) clear or pale yellow. Activity  Exercise only as told by your doctor. Stop exercising if you have cramps or pain in your lower belly (abdomen) or low back.  Do not exercise if it is too hot, too humid, or if you are in a place of great height (high altitude).  Try to avoid standing for long periods of time. Move your legs often if you must stand in one place for a long time.  Avoid heavy lifting.  Wear low-heeled shoes. Sit and stand up straight.  You  can have sex unless your doctor tells you not to. Relieving pain and discomfort  Wear a good support bra if your breasts are sore.  Take warm water baths (sitz baths) to soothe pain or discomfort caused by hemorrhoids. Use hemorrhoid cream if your doctor says it is okay.  Rest with your legs raised if you have leg cramps or low back pain.  If you have puffy, bulging veins (varicose veins) in your legs: ? Wear support hose or compression stockings as told by your doctor. ? Raise (elevate) your feet for 15 minutes, 3-4 times a day. ? Limit salt in your food. Prenatal care  Schedule your prenatal visits by the twelfth week of pregnancy.  Write down your questions. Take them to your prenatal visits.  Keep all your prenatal visits as told by your doctor. This is important. Safety  Wear your seat belt at all times when driving.  Make a list of emergency phone numbers. The list should include numbers for family, friends, the hospital, and police and fire departments. General instructions  Ask your doctor for a referral to a local prenatal class. Begin classes no later than at the start of month 6 of your pregnancy.  Ask for help if you need counseling or if you need help with nutrition. Your doctor can give you advice or tell you where to go for help.  Do not use hot tubs, steam rooms, or   saunas.  Do not douche or use tampons or scented sanitary pads.  Do not cross your legs for long periods of time.  Avoid all herbs and alcohol. Avoid drugs that are not approved by your doctor.  Do not use any tobacco products, including cigarettes, chewing tobacco, and electronic cigarettes. If you need help quitting, ask your doctor. You may get counseling or other support to help you quit.  Avoid cat litter boxes and soil used by cats. These carry germs that can cause birth defects in the baby and can cause a loss of your baby (miscarriage) or stillbirth.  Visit your dentist. At home, brush  your teeth with a soft toothbrush. Be gentle when you floss. Contact a doctor if:  You are dizzy.  You have mild cramps or pressure in your lower belly.  You have a nagging pain in your belly area.  You continue to feel sick to your stomach, you throw up, or you have watery poop (diarrhea).  You have a bad smelling fluid coming from your vagina.  You have pain when you pee (urinate).  You have increased puffiness (swelling) in your face, hands, legs, or ankles. Get help right away if:  You have a fever.  You are leaking fluid from your vagina.  You have spotting or bleeding from your vagina.  You have very bad belly cramping or pain.  You gain or lose weight rapidly.  You throw up blood. It may look like coffee grounds.  You are around people who have German measles, fifth disease, or chickenpox.  You have a very bad headache.  You have shortness of breath.  You have any kind of trauma, such as from a fall or a car accident. Summary  The first trimester of pregnancy is from week 1 until the end of week 13 (months 1 through 3).  To take care of yourself and your unborn baby, you will need to eat healthy meals, take medicines only if your doctor tells you to do so, and do activities that are safe for you and your baby.  Keep all follow-up visits as told by your doctor. This is important as your doctor will have to ensure that your baby is healthy and growing well. This information is not intended to replace advice given to you by your health care provider. Make sure you discuss any questions you have with your health care provider. Document Released: 02/27/2008 Document Revised: 09/18/2016 Document Reviewed: 09/18/2016 Elsevier Interactive Patient Education  2017 [employer]  

## 2018-03-12 NOTE — Progress Notes (Signed)
Generic testing today

## 2018-03-13 ENCOUNTER — Encounter: Payer: Self-pay | Admitting: *Deleted

## 2018-03-13 ENCOUNTER — Telehealth: Payer: Self-pay | Admitting: Student

## 2018-03-13 ENCOUNTER — Other Ambulatory Visit: Payer: Self-pay | Admitting: Student

## 2018-03-13 LAB — OBSTETRIC PANEL, INCLUDING HIV
ANTIBODY SCREEN: NEGATIVE
BASOS ABS: 0 10*3/uL (ref 0.0–0.2)
BASOS: 0 %
EOS (ABSOLUTE): 0.1 10*3/uL (ref 0.0–0.4)
EOS: 2 %
HEMATOCRIT: 31.9 % — AB (ref 34.0–46.6)
HEP B S AG: NEGATIVE
HIV SCREEN 4TH GENERATION: NONREACTIVE
Hemoglobin: 9.7 g/dL — ABNORMAL LOW (ref 11.1–15.9)
Immature Grans (Abs): 0 10*3/uL (ref 0.0–0.1)
Immature Granulocytes: 0 %
LYMPHS ABS: 2 10*3/uL (ref 0.7–3.1)
Lymphs: 24 %
MCH: 21.3 pg — AB (ref 26.6–33.0)
MCHC: 30.4 g/dL — ABNORMAL LOW (ref 31.5–35.7)
MCV: 70 fL — AB (ref 79–97)
MONOCYTES: 6 %
Monocytes Absolute: 0.5 10*3/uL (ref 0.1–0.9)
NEUTROS ABS: 5.7 10*3/uL (ref 1.4–7.0)
Neutrophils: 68 %
Platelets: 504 10*3/uL — ABNORMAL HIGH (ref 150–450)
RBC: 4.56 x10E6/uL (ref 3.77–5.28)
RDW: 17.2 % — ABNORMAL HIGH (ref 12.3–15.4)
RPR: NONREACTIVE
RUBELLA: 1.53 {index} (ref >0.99)
Rh Factor: POSITIVE
WBC: 8.5 10*3/uL (ref 3.4–10.8)

## 2018-03-13 LAB — COMPREHENSIVE METABOLIC PANEL
ALBUMIN: 4.1 g/dL (ref 3.5–5.5)
ALT: 39 IU/L — ABNORMAL HIGH (ref 0–32)
AST: 25 IU/L (ref 0–40)
Albumin/Globulin Ratio: 1.3 (ref 1.2–2.2)
Alkaline Phosphatase: 61 IU/L (ref 39–117)
BILIRUBIN TOTAL: 0.3 mg/dL (ref 0.0–1.2)
BUN / CREAT RATIO: 8 — AB (ref 9–23)
BUN: 5 mg/dL — AB (ref 6–20)
CHLORIDE: 103 mmol/L (ref 96–106)
CO2: 20 mmol/L (ref 20–29)
Calcium: 9.2 mg/dL (ref 8.7–10.2)
Creatinine, Ser: 0.63 mg/dL (ref 0.57–1.00)
GFR calc non Af Amer: 127 mL/min/{1.73_m2} (ref >59)
GFR, EST AFRICAN AMERICAN: 146 mL/min/{1.73_m2} (ref >59)
GLUCOSE: 74 mg/dL (ref 65–99)
Globulin, Total: 3.2 g/dL (ref 1.5–4.5)
Potassium: 4.4 mmol/L (ref 3.5–5.2)
Sodium: 135 mmol/L (ref 134–144)
TOTAL PROTEIN: 7.3 g/dL (ref 6.0–8.5)

## 2018-03-13 LAB — PROTEIN / CREATININE RATIO, URINE
Creatinine, Urine: 385.8 mg/dL
Protein, Ur: 37.4 mg/dL
Protein/Creat Ratio: 97 mg/g creat (ref 0–200)

## 2018-03-13 LAB — GC/CHLAMYDIA PROBE AMP (~~LOC~~) NOT AT ARMC
Chlamydia: NEGATIVE
NEISSERIA GONORRHEA: NEGATIVE

## 2018-03-13 MED ORDER — FERROUS FUMARATE 325 (106 FE) MG PO TABS: 1.0000 | ORAL_TABLET | Freq: Two times a day (BID) | ORAL | 1 refills | Status: DC

## 2018-03-13 NOTE — Assessment & Plan Note (Signed)
PCR was 0.12

## 2018-03-13 NOTE — Progress Notes (Signed)
  Subjective:    Lori Short is being seen today for her first obstetrical visit.  This is not a planned pregnancy. She is at 7073w4d gestation. Her obstetrical history is significant for gestational hypertension in last pregnancy. No induction or medication with that pregnancy. . She has stopped using alcohol or THC. Relationship with FOB: FOB not involved. . Patient does intend to breast feed. Pregnancy history fully reviewed.  Patient reports no complaints. She thinks she has BV but she does not want treatment for it as it is not bothering her.   Review of Systems:   Review of Systems  Constitutional: Negative.   HENT: Negative.   Eyes: Negative.   Respiratory: Negative.   Gastrointestinal: Negative.   Genitourinary: Negative.   Musculoskeletal: Negative.   Allergic/Immunologic: Negative.   Neurological: Negative.     Objective:     BP 105/68   Pulse (!) 102   Wt 219 lb 6.4 oz (99.5 kg)   LMP 12/09/2017   BMI 37.66 kg/m  Physical Exam  Constitutional: [name], place, and time. She appears well-developed.  HENT:  Head: Normocephalic.  Neck: Normal range of motion.  Respiratory: Effort normal.  GI: Soft.  Genitourinary: Vagina normal.  Genitourinary Comments: Normal external female genitalia; no lesions or discharge in vaginal walls or labia. Cervix is pink with no lesions. No CMT, suprapubic or adnexal tenderness.   Musculoskeletal: Normal range of motion.  Neurological: [name], place, and time.  Skin: Skin is warm.  FHR is 155 by informal bedside US.  Pt informed that the ultrasound is considered a limited OB ultrasound and is not intended to be a complete ultrasound exam.  Patient also informed that the ultrasound is not being completed with the intent of assessing for fetal or placental anomalies or any pelvic abnormalities.  Explained that the purpose of today's ultrasound is to assess for  Fetal heart rate.  Patient  acknowledges the purpose of the exam and the limitations of the study.      Exam    Assessment:    Pregnancy: G2P1001 Patient Active Problem List   Diagnosis Date Noted  . Supervision of other normal pregnancy, antepartum 03/12/2018  . PIH (pregnancy induced hypertension), third trimester 03/18/2017  . Proteinuria affecting pregnancy in third trimester 03/18/2017  . Edema during pregnancy in third trimester 03/18/2017  . Visual changes 03/18/2017  . Rubella non-immune status, antepartum 11/20/2016  . Substance abuse affecting pregnancy, antepartum 11/20/2016  . Smoker 09/30/2014  . Maternal obesity, antepartum 09/30/2014      -Reviewed labs from June 2018; patient's CBC, CMP and protein creatinine ratio were  Normal; patient did not meet criteria for pre-e.  Plan:     Initial labs drawn. Prenatal vitamins. Problem list reviewed and updated. AFP3 discussed: will do NIPS. Marland Kitchen. Role of ultrasound in pregnancy discussed; fetal survey: will order at next visit. . Amniocentesis discussed: not indicated. Follow up in 4 weeks. 50% of 40 min visit spent on counseling and coordination of care.  -Patient plans to do IUD; does not want any more kids for 5 years -Patient declines Baby RX optimization; she would like to do Baby Rx App.  -Pap today -Encouraged patient to continue avoiding alcohol and THC.  -Baseline labs and start on baby ASA today.   Charlesetta GaribaldiKathryn Lorraine [facility] 03/13/2018

## 2018-03-13 NOTE — Telephone Encounter (Signed)
Left voicemail for patient about anemia and that RX for iron was sent to pharmacy on file.

## 2018-03-14 LAB — CYTOLOGY - PAP: Diagnosis: NEGATIVE

## 2018-03-15 ENCOUNTER — Encounter: Payer: Self-pay | Admitting: Student

## 2018-03-15 DIAGNOSIS — O99011 Anemia complicating pregnancy, first trimester: Secondary | ICD-10-CM | POA: Insufficient documentation

## 2018-03-18 LAB — SMN1 COPY NUMBER ANALYSIS (SMA CARRIER SCREENING)

## 2018-03-18 LAB — HEMOGLOBINOPATHY EVALUATION
HEMOGLOBIN A2 QUANTITATION: 1.9 % (ref 1.8–3.2)
HEMOGLOBIN F QUANTITATION: 0 % (ref 0.0–2.0)
HGB C: 0 %
HGB S: 0 %
HGB VARIANT: 0 %
Hgb A: 98.1 % (ref 96.4–98.8)

## 2018-03-18 LAB — CYSTIC FIBROSIS MUTATION 97: GENE DIS ANAL CARRIER INTERP BLD/T-IMP: NOT DETECTED

## 2018-04-09 ENCOUNTER — Ambulatory Visit (INDEPENDENT_AMBULATORY_CARE_PROVIDER_SITE_OTHER): Payer: Medicaid Other | Admitting: Family Medicine

## 2018-04-09 VITALS — BP 115/76 | HR 72 | Wt 221.0 lb

## 2018-04-09 DIAGNOSIS — O99322 Drug use complicating pregnancy, second trimester: Secondary | ICD-10-CM

## 2018-04-09 DIAGNOSIS — O359XX0 Maternal care for (suspected) fetal abnormality and damage, unspecified, not applicable or unspecified: Secondary | ICD-10-CM

## 2018-04-09 DIAGNOSIS — O99012 Anemia complicating pregnancy, second trimester: Secondary | ICD-10-CM

## 2018-04-09 DIAGNOSIS — O99210 Obesity complicating pregnancy, unspecified trimester: Secondary | ICD-10-CM

## 2018-04-09 DIAGNOSIS — O99011 Anemia complicating pregnancy, first trimester: Secondary | ICD-10-CM

## 2018-04-09 DIAGNOSIS — O99320 Drug use complicating pregnancy, unspecified trimester: Secondary | ICD-10-CM

## 2018-04-09 DIAGNOSIS — Z3482 Encounter for supervision of other normal pregnancy, second trimester: Secondary | ICD-10-CM

## 2018-04-09 DIAGNOSIS — O99212 Obesity complicating pregnancy, second trimester: Secondary | ICD-10-CM

## 2018-04-09 DIAGNOSIS — Z3480 Encounter for supervision of other normal pregnancy, unspecified trimester: Secondary | ICD-10-CM

## 2018-04-09 NOTE — Progress Notes (Signed)
   PRENATAL VISIT NOTE  Subjective:  [name] is a 22 y.o. G2P1001 at 4177w3d being seen today for ongoing prenatal care.  She is currently monitored for the following issues for this high-risk pregnancy and has Smoker; Maternal obesity, antepartum; Rubella non-immune status, antepartum; Substance abuse affecting pregnancy, antepartum; History of gestational hypertension; Proteinuria affecting pregnancy in third trimester; Edema during pregnancy in third trimester; Supervision of other normal pregnancy, antepartum; and Anemia affecting pregnancy in first trimester on their problem list.  Patient reports no complaints.   .  .  Movement: Present. Denies leaking of fluid.   The following portions of the patient's history were reviewed and updated as appropriate: allergies, current medications, past family history, past medical history, past social history, past surgical history and problem list. Problem list updated.  Objective:   Vitals:   04/09/18 1422  BP: 115/76  Pulse: 72  Weight: 221 lb (100.2 kg)    Fetal Status: Fetal Heart Rate (bpm): 142   Movement: Present     General:  Alert, oriented and cooperative. Patient is in no acute distress.  Skin: Skin is warm and dry. No rash noted.   Cardiovascular: Normal heart rate noted  Respiratory: Normal respiratory effort, no problems with respiration noted  Abdomen: Soft, gravid, appropriate for gestational age.  Pain/Pressure: Absent     Pelvic: Cervical exam deferred        Extremities: Normal range of motion.  Edema: None  Mental Status: Normal mood and affect. Normal behavior. Normal judgment and thought content.   Assessment and Plan:  Pregnancy: G2P1001 at 6577w3d  1. Supervision of other normal pregnancy, antepartum UTD currently   2. Anemia affecting pregnancy in first trimester Recommended Fe supplement  3. Substance abuse affecting pregnancy, antepartum Counseled to stop smoking  4. Maternal obesity, antepartum TWG=  -10 lb (-4.536 kg)  5. Suspected fetal anomaly, antepartum, single or unspecified fetus - NIPT "High risk"-- discussed with patient and reviewed need for further evaluation.  - Detailed MFM US ordered - AMB MFM GENETICS REFERRAL - AFP TETRA  Preterm labor symptoms and general obstetric precautions including but not limited to vaginal bleeding, contractions, leaking of fluid and fetal movement were reviewed in detail with the patient. Please refer to After Visit Summary for other counseling recommendations.  Return in about 1 month (around 05/07/2018) for Routine prenatal care.  Future Appointments  Date Time Provider Department Center  04/28/2018  9:00 AM [facility] US 3 [facility]  04/28/2018 10:00 AM [facility] GENETIC COUNSELING RM [facility]  05/07/2018  9:45 AM Alvester MorinNewton, Isa RankinKimberly Niles, MD [facility]    Federico FlakeKimberly Niles Lenton Gendreau, MD

## 2018-04-11 ENCOUNTER — Telehealth: Payer: Self-pay

## 2018-04-11 LAB — AFP TETRA
DIA MOM VALUE: 0.69
DIA Value (EIA): 97.34 pg/mL
DSR (BY AGE) 1 IN: 1074
DSR (Second Trimester) 1 IN: 10000
Gestational Age: 16 WEEKS
MATERNAL AGE AT EDD: 23.9 a
MSAFP MOM: 1.02
MSAFP: 28.7 ng/mL
MSHCG MOM: 0.59
MSHCG: 20065 m[IU]/mL
Osb Risk: 10000
Test Results:: NEGATIVE
UE3 VALUE: 0.84 ng/mL
Weight: 221 [lb_av]
uE3 Mom: 1.11

## 2018-04-11 NOTE — Telephone Encounter (Signed)
-----   Message from Federico FlakeKimberly Niles Newton, MD sent at 04/11/2018 10:11 AM EDT ----- Isla PenceQuad Screen low risk for trisomy. Please call patient with reassuring results.

## 2018-04-11 NOTE — Telephone Encounter (Signed)
Called patient to inform her of low risk results regarding trisomy. Patient was very happy to received those results.

## 2018-04-21 ENCOUNTER — Encounter (HOSPITAL_COMMUNITY): Payer: Self-pay

## 2018-04-28 ENCOUNTER — Other Ambulatory Visit: Payer: Self-pay | Admitting: Family Medicine

## 2018-04-28 ENCOUNTER — Ambulatory Visit (HOSPITAL_COMMUNITY): Payer: Medicaid Other

## 2018-04-28 ENCOUNTER — Ambulatory Visit (HOSPITAL_COMMUNITY)
Admission: RE | Admit: 2018-04-28 | Discharge: 2018-04-28 | Disposition: A | Discharge status: Home / Self Care | Payer: Medicaid Other | Source: Ambulatory Visit | Attending: Family Medicine | Admitting: Family Medicine

## 2018-04-28 ENCOUNTER — Encounter (HOSPITAL_COMMUNITY): Payer: Self-pay

## 2018-04-28 ENCOUNTER — Other Ambulatory Visit (HOSPITAL_COMMUNITY): Payer: Self-pay | Admitting: *Deleted

## 2018-04-28 DIAGNOSIS — Z3480 Encounter for supervision of other normal pregnancy, unspecified trimester: Secondary | ICD-10-CM

## 2018-04-28 DIAGNOSIS — Z3A20 20 weeks gestation of pregnancy: Secondary | ICD-10-CM | POA: Insufficient documentation

## 2018-04-28 DIAGNOSIS — Z363 Encounter for antenatal screening for malformations: Secondary | ICD-10-CM

## 2018-04-28 DIAGNOSIS — O358XX0 Maternal care for other (suspected) fetal abnormality and damage, not applicable or unspecified: Secondary | ICD-10-CM

## 2018-04-28 DIAGNOSIS — O321XX0 Maternal care for breech presentation, not applicable or unspecified: Secondary | ICD-10-CM | POA: Insufficient documentation

## 2018-04-28 DIAGNOSIS — O289 Unspecified abnormal findings on antenatal screening of mother: Secondary | ICD-10-CM | POA: Insufficient documentation

## 2018-04-28 DIAGNOSIS — O35EXX0 Maternal care for other (suspected) fetal abnormality and damage, fetal genitourinary anomalies, not applicable or unspecified: Secondary | ICD-10-CM

## 2018-04-28 DIAGNOSIS — O359XX0 Maternal care for (suspected) fetal abnormality and damage, unspecified, not applicable or unspecified: Secondary | ICD-10-CM

## 2018-04-28 NOTE — ED Notes (Signed)
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

## 2018-05-01 ENCOUNTER — Encounter: Payer: Self-pay | Admitting: Family Medicine

## 2018-05-01 DIAGNOSIS — O358XX0 Maternal care for other (suspected) fetal abnormality and damage, not applicable or unspecified: Secondary | ICD-10-CM | POA: Insufficient documentation

## 2018-05-01 DIAGNOSIS — O35EXX0 Maternal care for other (suspected) fetal abnormality and damage, fetal genitourinary anomalies, not applicable or unspecified: Secondary | ICD-10-CM | POA: Insufficient documentation

## 2018-05-07 ENCOUNTER — Encounter: Payer: Self-pay | Admitting: Family Medicine

## 2018-05-07 NOTE — Progress Notes (Deleted)
Patient no-showed her appointment.   

## 2018-05-13 ENCOUNTER — Ambulatory Visit (INDEPENDENT_AMBULATORY_CARE_PROVIDER_SITE_OTHER): Payer: Medicaid Other | Admitting: Family Medicine

## 2018-05-13 VITALS — BP 104/69 | HR 72 | Wt 224.0 lb

## 2018-05-13 DIAGNOSIS — Z3480 Encounter for supervision of other normal pregnancy, unspecified trimester: Secondary | ICD-10-CM

## 2018-05-13 DIAGNOSIS — Z8759 Personal history of other complications of pregnancy, childbirth and the puerperium: Secondary | ICD-10-CM

## 2018-05-13 MED ORDER — PRENATAL VITAMINS 0.8 MG PO TABS: 1.0000 | ORAL_TABLET | Freq: Every day | ORAL | 12 refills | Status: DC

## 2018-05-13 MED ORDER — ASPIRIN EC 81 MG PO TBEC: 81.0000 mg | DELAYED_RELEASE_TABLET | Freq: Every day | ORAL | 1 refills | Status: DC

## 2018-05-13 NOTE — Progress Notes (Signed)
    PRENATAL VISIT NOTE  Subjective:  Lori Short is a 22 y.o. G2P1001 at 1935w1d being seen today for ongoing prenatal care.  She is currently monitored for the following issues for this low-risk pregnancy and has Smoker; Maternal obesity, antepartum; Rubella non-immune status, antepartum; Substance abuse affecting pregnancy, antepartum; History of gestational hypertension; Proteinuria affecting pregnancy in third trimester; Edema during pregnancy in third trimester; Supervision of other normal pregnancy, antepartum; Anemia affecting pregnancy in first trimester; and Pyelectasis of fetus on prenatal ultrasound on their problem list.  Patient reports no complaints.   .  .  Movement: Present. Denies leaking of fluid.   The following portions of the patient's history were reviewed and updated as appropriate: allergies, current medications, past family history, past medical history, past social history, past surgical history and problem list. Problem list updated.  Objective:   Vitals:   05/13/18 1538  BP: 104/69  Pulse: 72  Weight: 224 lb (101.6 kg)    Fetal Status: Fetal Heart Rate (bpm): 142 Fundal Height: 21 cm Movement: Present     General:  Alert, oriented and cooperative. Patient is in no acute distress.  Skin: Skin is warm and dry. No rash noted.   Cardiovascular: Normal heart rate noted  Respiratory: Normal respiratory effort, no problems with respiration noted  Abdomen: Soft, gravid, appropriate for gestational age.  Pain/Pressure: Absent     Pelvic: Cervical exam deferred        Extremities: Normal range of motion.  Edema: None  Mental Status: Normal mood and affect. Normal behavior. Normal judgment and thought content.   Assessment and Plan:  Pregnancy: G2P1001 at 2135w1d  1. Supervision of other normal pregnancy, antepartum Refilled PNV's Continue routine prenatal care.  - Prenatal Multivit-Min-Fe-FA (PRENATAL VITAMINS) 0.8 MG tablet; Take 1 tablet by mouth daily.   Dispense: 30 tablet; Refill: 12  2. History of gestational hypertension Continue ASA BP is ok - aspirin EC 81 MG tablet; Take 1 tablet (81 mg total) by mouth daily.  Dispense: 90 tablet; Refill: 1  Preterm labor symptoms and general obstetric precautions including but not limited to vaginal bleeding, contractions, leaking of fluid and fetal movement were reviewed in detail with the patient. Please refer to After Visit Summary for other counseling recommendations.  Return in 4 weeks (on 06/10/2018) for 28 wk labs.  Future Appointments  Date Time Provider Department Center  05/27/2018 11:30 AM [facility] US 5 [facility]  06/10/2018  9:45 AM Reva BoresPratt, Blaize Epple S, MD [facility]    Reva Boresanya S Earlene Bjelland, MD

## 2018-05-13 NOTE — Progress Notes (Signed)
DECLINED FLU

## 2018-05-13 NOTE — Patient Instructions (Signed)

## 2018-05-27 ENCOUNTER — Other Ambulatory Visit (HOSPITAL_COMMUNITY): Payer: Self-pay

## 2018-05-27 ENCOUNTER — Other Ambulatory Visit (HOSPITAL_COMMUNITY): Payer: Self-pay | Admitting: *Deleted

## 2018-05-27 ENCOUNTER — Encounter (HOSPITAL_COMMUNITY): Payer: Self-pay

## 2018-05-27 ENCOUNTER — Ambulatory Visit (HOSPITAL_COMMUNITY)
Admission: RE | Admit: 2018-05-27 | Discharge: 2018-05-27 | Disposition: A | Discharge status: Home / Self Care | Payer: Medicaid Other | Source: Ambulatory Visit | Attending: Family Medicine | Admitting: Family Medicine

## 2018-05-27 ENCOUNTER — Other Ambulatory Visit (HOSPITAL_COMMUNITY): Payer: Self-pay | Admitting: Obstetrics and Gynecology

## 2018-05-27 DIAGNOSIS — O09292 Supervision of pregnancy with other poor reproductive or obstetric history, second trimester: Secondary | ICD-10-CM | POA: Diagnosis not present

## 2018-05-27 DIAGNOSIS — O289 Unspecified abnormal findings on antenatal screening of mother: Secondary | ICD-10-CM | POA: Diagnosis not present

## 2018-05-27 DIAGNOSIS — O99332 Smoking (tobacco) complicating pregnancy, second trimester: Secondary | ICD-10-CM | POA: Diagnosis not present

## 2018-05-27 DIAGNOSIS — F17200 Nicotine dependence, unspecified, uncomplicated: Secondary | ICD-10-CM | POA: Diagnosis not present

## 2018-05-27 DIAGNOSIS — O358XX0 Maternal care for other (suspected) fetal abnormality and damage, not applicable or unspecified: Secondary | ICD-10-CM | POA: Diagnosis present

## 2018-05-27 DIAGNOSIS — O359XX0 Maternal care for (suspected) fetal abnormality and damage, unspecified, not applicable or unspecified: Secondary | ICD-10-CM | POA: Diagnosis not present

## 2018-05-27 DIAGNOSIS — O283 Abnormal ultrasonic finding on antenatal screening of mother: Secondary | ICD-10-CM | POA: Insufficient documentation

## 2018-05-27 DIAGNOSIS — Z3A24 24 weeks gestation of pregnancy: Secondary | ICD-10-CM | POA: Insufficient documentation

## 2018-05-27 DIAGNOSIS — O99212 Obesity complicating pregnancy, second trimester: Secondary | ICD-10-CM | POA: Insufficient documentation

## 2018-05-27 DIAGNOSIS — E669 Obesity, unspecified: Secondary | ICD-10-CM | POA: Diagnosis not present

## 2018-05-27 DIAGNOSIS — Z362 Encounter for other antenatal screening follow-up: Secondary | ICD-10-CM

## 2018-05-27 DIAGNOSIS — O321XX0 Maternal care for breech presentation, not applicable or unspecified: Secondary | ICD-10-CM | POA: Insufficient documentation

## 2018-05-27 DIAGNOSIS — O09293 Supervision of pregnancy with other poor reproductive or obstetric history, third trimester: Secondary | ICD-10-CM | POA: Diagnosis not present

## 2018-05-27 DIAGNOSIS — O35EXX0 Maternal care for other (suspected) fetal abnormality and damage, fetal genitourinary anomalies, not applicable or unspecified: Secondary | ICD-10-CM

## 2018-05-29 ENCOUNTER — Telehealth (HOSPITAL_COMMUNITY): Payer: Self-pay | Admitting: Obstetrics and Gynecology

## 2018-05-29 NOTE — Telephone Encounter (Signed)
Called Lori Short and informed her of normal FISH results. She is waiting for an appointment at [facility]. FISH: Normal FEMALE

## 2018-05-30 ENCOUNTER — Encounter (HOSPITAL_COMMUNITY): Payer: Self-pay

## 2018-06-02 ENCOUNTER — Encounter: Payer: Self-pay | Admitting: Radiology

## 2018-06-02 ENCOUNTER — Encounter (HOSPITAL_COMMUNITY): Payer: Self-pay

## 2018-06-04 ENCOUNTER — Encounter: Payer: Self-pay | Admitting: Family Medicine

## 2018-06-04 DIAGNOSIS — O358XX0 Maternal care for other (suspected) fetal abnormality and damage, not applicable or unspecified: Secondary | ICD-10-CM | POA: Insufficient documentation

## 2018-06-05 ENCOUNTER — Telehealth (HOSPITAL_COMMUNITY): Payer: Self-pay | Admitting: *Deleted

## 2018-06-05 NOTE — Telephone Encounter (Signed)
Name and DOB verified.  Called to give karyotype results from amniocentesis, normal female karyotype given, microarray pending.  Pt voiced understanding.

## 2018-06-09 NOTE — Progress Notes (Signed)
Patient reports not able to take antibiotic for BV due to severe Nausea and vomiting when taking the medication. Patient would like to know options - creams for BV.

## 2018-06-10 ENCOUNTER — Ambulatory Visit (INDEPENDENT_AMBULATORY_CARE_PROVIDER_SITE_OTHER): Payer: Medicaid Other | Admitting: Family Medicine

## 2018-06-10 VITALS — BP 106/72 | HR 91 | Wt 224.0 lb

## 2018-06-10 DIAGNOSIS — B9689 Other specified bacterial agents as the cause of diseases classified elsewhere: Secondary | ICD-10-CM

## 2018-06-10 DIAGNOSIS — Z3482 Encounter for supervision of other normal pregnancy, second trimester: Secondary | ICD-10-CM | POA: Diagnosis not present

## 2018-06-10 DIAGNOSIS — Z3480 Encounter for supervision of other normal pregnancy, unspecified trimester: Secondary | ICD-10-CM

## 2018-06-10 DIAGNOSIS — Z23 Encounter for immunization: Secondary | ICD-10-CM | POA: Diagnosis not present

## 2018-06-10 DIAGNOSIS — O358XX0 Maternal care for other (suspected) fetal abnormality and damage, not applicable or unspecified: Secondary | ICD-10-CM | POA: Diagnosis not present

## 2018-06-10 DIAGNOSIS — N760 Acute vaginitis: Secondary | ICD-10-CM | POA: Diagnosis not present

## 2018-06-10 MED ORDER — METRONIDAZOLE 0.75 % VA GEL: 1.0000 | Freq: Every day | VAGINAL | 1 refills | Status: DC

## 2018-06-10 NOTE — Patient Instructions (Addendum)
Tdap Vaccine (Tetanus, Diphtheria and Pertussis): What You Need to Know  1. Why get vaccinated? Tetanus, diphtheria and pertussis are very serious diseases. Tdap vaccine can protect [place] from these diseases. And, Tdap vaccine given to pregnant women can protect newborn babies against pertussis. TETANUS (Lockjaw) is rare in the [place] today. It causes painful muscle tightening and stiffness, usually all over the body.  It can lead to tightening of muscles in the head and neck so you can't open your mouth, swallow, or sometimes even breathe. Tetanus kills about 1 out of 10 people who are infected even after receiving the best medical care.  DIPHTHERIA is also rare in the [place] today. It can cause a thick coating to form in the back of the throat.  It can lead to breathing problems, heart failure, paralysis, and death.  PERTUSSIS (Whooping Cough) causes severe coughing spells, which can cause difficulty breathing, vomiting and disturbed sleep.  It can also lead to weight loss, incontinence, and rib fractures. Up to 2 in 100 adolescents and 5 in 100 adults with pertussis are hospitalized or have complications, which could include pneumonia or death.  These diseases are caused by bacteria. Diphtheria and pertussis are spread from person to person through secretions from coughing or sneezing. Tetanus enters the body through cuts, scratches, or wounds. Before vaccines, as many as 200,000 cases of diphtheria, 200,000 cases of pertussis, and hundreds of cases of tetanus, were reported in the [place] each year. Since vaccination began, reports of cases for tetanus and diphtheria have dropped by about 99% and for pertussis by about 80%. 2. Tdap vaccine Tdap vaccine can protect adolescents and adults from tetanus, diphtheria, and pertussis. One dose of Tdap is routinely given at age 14 or 59. People who did not get Tdap at that age should get it as soon as possible. Tdap is especially  important for healthcare professionals and anyone having close contact with a baby younger than 12 months. Pregnant women should get a dose of Tdap during every pregnancy, to protect the newborn from pertussis. Infants are most at risk for severe, life-threatening complications from pertussis. Another vaccine, called Td, protects against tetanus and diphtheria, but not pertussis. A Td booster should be given every 10 years. Tdap may be given as one of these boosters if you have never gotten Tdap before. Tdap may also be given after a severe cut or burn to prevent tetanus infection. Your doctor or the person giving you the vaccine can give you more information. Tdap may safely be given at the same time as other vaccines. 3. Some people should not get this vaccine  A person who has ever had a life-threatening allergic reaction after a previous dose of any diphtheria, tetanus or pertussis containing vaccine, OR has a severe allergy to any part of this vaccine, should not get Tdap vaccine. Tell the person giving the vaccine about any severe allergies.  Anyone who had coma or long repeated seizures within 7 days after a childhood dose of DTP or DTaP, or a previous dose of Tdap, should not get Tdap, unless a cause other than the vaccine was found. They can still get Td.  Talk to your doctor if you: ? have seizures or another nervous system problem, ? had severe pain or swelling after any vaccine containing diphtheria, tetanus or pertussis, ? ever had a condition called Guillain-Barr Syndrome (GBS), ? aren't feeling well on the day the shot is scheduled. 4. Risks With any medicine,  including vaccines, there is a chance of side effects. These are usually mild and go away on their own. Serious reactions are also possible but are rare. Most people who get Tdap vaccine do not have any problems with it. Mild problems following Tdap: (Did not interfere with activities)  Pain where the shot was given (about  3 in 4 adolescents or 2 in 3 adults)  Redness or swelling where the shot was given (about 1 person in 5)  Mild fever of at least 100.4F (up to about 1 in 25 adolescents or 1 in 100 adults)  Headache (about 3 or 4 people in 10)  Tiredness (about 1 person in 3 or 4)  Nausea, vomiting, diarrhea, stomach ache (up to 1 in 4 adolescents or 1 in 10 adults)  Chills, sore joints (about 1 person in 10)  Body aches (about 1 person in 3 or 4)  Rash, swollen glands (uncommon)  Moderate problems following Tdap: (Interfered with activities, but did not require medical attention)  Pain where the shot was given (up to 1 in 5 or 6)  Redness or swelling where the shot was given (up to about 1 in 16 adolescents or 1 in 12 adults)  Fever over 102F (about 1 in 100 adolescents or 1 in 250 adults)  Headache (about 1 in 7 adolescents or 1 in 10 adults)  Nausea, vomiting, diarrhea, stomach ache (up to 1 or 3 people in 100)  Swelling of the entire arm where the shot was given (up to about 1 in 500).  Severe problems following Tdap: (Unable to perform usual activities; required medical attention)  Swelling, severe pain, bleeding and redness in the arm where the shot was given (rare).  Problems that could happen after any vaccine:  People sometimes faint after a medical procedure, including vaccination. Sitting or lying down for about 15 minutes can help prevent fainting, and injuries caused by a fall. Tell your doctor if you feel dizzy, or have vision changes or ringing in the ears.  Some people get severe pain in the shoulder and have difficulty moving the arm where a shot was given. This happens very rarely.  Any medication can cause a severe allergic reaction. Such reactions from a vaccine are very rare, estimated at fewer than 1 in a million doses, and would happen within a few minutes to a few hours after the vaccination. As with any medicine, there is a very remote chance of a vaccine  causing a serious injury or death. The safety of vaccines is always being monitored. For more information, visit: www.cdc.gov/vaccinesafety/ 5. What if there is a serious problem? What should I look for? Look for anything that concerns you, such as signs of a severe allergic reaction, very high fever, or unusual behavior. Signs of a severe allergic reaction can include hives, swelling of the face and throat, difficulty breathing, a fast heartbeat, dizziness, and weakness. These would usually start a few minutes to a few hours after the vaccination. What should I do?  If you think it is a severe allergic reaction or other emergency that can't wait, call 9-1-1 or get the person to the nearest hospital. Otherwise, call your doctor.  Afterward, the reaction should be reported to the Vaccine Adverse Event Reporting System (VAERS). Your doctor might file this report, or you can do it yourself through the VAERS web site at www.vaers.hhs.gov, or by calling 1-800-822-7967. ? VAERS does not give medical advice. 6. The National Vaccine Injury Compensation Program The   National Scientist, forensic (VICP) is a Stage manager that was created to compensate people who may have been injured by certain vaccines. Persons who believe they may have been injured by a vaccine can learn about the program and about filing a claim by calling 1-808-719-8303 or visiting the VICP website at SpiritualWord.at. There is a time limit to file a claim for compensation. 7. How can I learn more?  Ask your doctor. He or she can give you the vaccine package insert or suggest other sources of information.  Call your local or state health department.  Contact the Centers for Disease Control and Prevention (CDC): ? Call 9792461147 (1-800-CDC-INFO) or ? Visit CDC's website at PicCapture.uy CDC Tdap Vaccine VIS (11/17/13) This information is not intended to replace advice given to you by your  health care provider. Make sure you discuss any questions you have with your health care provider. Document Released: 03/11/2012 Document Revised: 05/31/2016 Document Reviewed: 05/31/2016 Elsevier Interactive Patient Education  2017 [employer].   Third Trimester of Pregnancy The third trimester is from week 28 through week 40 (months 7 through 9). The third trimester is a time when the unborn baby (fetus) is growing rapidly. At the end of the ninth month, the fetus is about 20 inches in length and weighs 6-10 pounds. Body changes during your third trimester Your body will continue to go through many changes during pregnancy. The changes vary from woman to woman. During the third trimester:  Your weight will continue to increase. You can expect to gain 25-35 pounds (11-16 kg) by the end of the pregnancy.  You may begin to get stretch marks on your hips, abdomen, and breasts.  You may urinate more often because the fetus is moving lower into your pelvis and pressing on your bladder.  You may develop or continue to have heartburn. This is caused by increased hormones that slow down muscles in the digestive tract.  You may develop or continue to have constipation because increased hormones slow digestion and cause the muscles that push waste through your intestines to relax.  You may develop hemorrhoids. These are swollen veins (varicose veins) in the rectum that can itch or be painful.  You may develop swollen, bulging veins (varicose veins) in your legs.  You may have increased body aches in the pelvis, back, or thighs. This is due to weight gain and increased hormones that are relaxing your joints.  You may have changes in your hair. These can include thickening of your hair, rapid growth, and changes in texture. Some women also have hair loss during or after pregnancy, or hair that feels dry or thin. Your hair will most likely return to normal after your baby is born.  Your breasts  will continue to grow and they will continue to become tender. A yellow fluid (colostrum) may leak from your breasts. This is the first milk you are producing for your baby.  Your belly button may stick out.  You may notice more swelling in your hands, face, or ankles.  You may have increased tingling or numbness in your hands, arms, and legs. The skin on your belly may also feel numb.  You may feel short of breath because of your expanding uterus.  You may have more problems sleeping. This can be caused by the size of your belly, increased need to urinate, and an increase in your body's metabolism.  You may notice the fetus "dropping," or moving lower in your abdomen (lightening).  You may have increased vaginal discharge.  You may notice your joints feel loose and you may have pain around your pelvic bone.  What to expect at prenatal visits You will have prenatal exams every 2 weeks until week 36. Then you will have weekly prenatal exams. During a routine prenatal visit:  You will be weighed to make sure you and the baby are growing normally.  Your blood pressure will be taken.  Your abdomen will be measured to track your baby's growth.  The fetal heartbeat will be listened to.  Any test results from the previous visit will be discussed.  You may have a cervical check near your due date to see if your cervix has softened or thinned (effaced).  You will be tested for Group B streptococcus. This happens between 35 and 37 weeks.  Your health care provider may ask you:  What your birth plan is.  How you are feeling.  If you are feeling the baby move.  If you have had any abnormal symptoms, such as leaking fluid, bleeding, severe headaches, or abdominal cramping.  If you are using any tobacco products, including cigarettes, chewing tobacco, and electronic cigarettes.  If you have any questions.  Other tests or screenings that may be performed during your third trimester  include:  Blood tests that check for low iron levels (anemia).  Fetal testing to check the health, activity level, and growth of the fetus. Testing is done if you have certain medical conditions or if there are problems during the pregnancy.  Nonstress test (NST). This test checks the health of your baby to make sure there are no signs of problems, such as the baby not getting enough oxygen. During this test, a belt is placed around your belly. The baby is made to move, and its heart rate is monitored during movement.  What is false labor? False labor is a condition in which you feel small, irregular tightenings of the muscles in the womb (contractions) that usually go away with rest, changing position, or drinking water. These are called Braxton Hicks contractions. Contractions may last for hours, days, or even weeks before true labor sets in. If contractions come at regular intervals, become more frequent, increase in intensity, or become painful, you should see your health care provider. What are the signs of labor?  Abdominal cramps.  Regular contractions that start at 10 minutes apart and become stronger and more frequent with time.  Contractions that start on the top of the uterus and spread down to the lower abdomen and back.  Increased pelvic pressure and dull back pain.  A watery or bloody mucus discharge that comes from the vagina.  Leaking of amniotic fluid. This is also known as your "water breaking." It could be a slow trickle or a gush. Let your health care provider know if it has a color or strange odor. If you have any of these signs, call your health care provider right away, even if it is before your due date. Follow these instructions at home: Medicines  Follow your health care provider's instructions regarding medicine use. Specific medicines may be either safe or unsafe to take during pregnancy.  Take a prenatal vitamin that contains at least 600 micrograms (mcg) of  folic acid.  If you develop constipation, try taking a stool softener if your health care provider approves. Eating and drinking  Eat a balanced diet that includes fresh fruits and vegetables, whole grains, good sources of protein such as meat,  eggs, or tofu, and low-fat dairy. Your health care provider will help you determine the amount of weight gain that is right for you.  Avoid raw meat and uncooked cheese. These carry germs that can cause birth defects in the baby.  If you have low calcium intake from food, talk to your health care provider about whether you should take a daily calcium supplement.  Eat four or five small meals rather than three large meals a day.  Limit foods that are high in fat and processed sugars, such as fried and sweet foods.  To prevent constipation: ? Drink enough fluid to keep your urine clear or pale yellow. ? Eat foods that are high in fiber, such as fresh fruits and vegetables, whole grains, and beans. Activity  Exercise only as directed by your health care provider. Most women can continue their usual exercise routine during pregnancy. Try to exercise for 30 minutes at least 5 days a week. Stop exercising if you experience uterine contractions.  Avoid heavy lifting.  Do not exercise in extreme heat or humidity, or at high altitudes.  Wear low-heel, comfortable shoes.  Practice good posture.  You may continue to have sex unless your health care provider tells you otherwise. Relieving pain and discomfort  Take frequent breaks and rest with your legs elevated if you have leg cramps or low back pain.  Take warm sitz baths to soothe any pain or discomfort caused by hemorrhoids. Use hemorrhoid cream if your health care provider approves.  Wear a good support bra to prevent discomfort from breast tenderness.  If you develop varicose veins: ? Wear support pantyhose or compression stockings as told by your healthcare provider. ? Elevate your feet for  15 minutes, 3-4 times a day. Prenatal care  Write down your questions. Take them to your prenatal visits.  Keep all your prenatal visits as told by your health care provider. This is important. Safety  Wear your seat belt at all times when driving.  Make a list of emergency phone numbers, including numbers for family, friends, the hospital, and police and fire departments. General instructions  Avoid cat litter boxes and soil used by cats. These carry germs that can cause birth defects in the baby. If you have a cat, ask someone to clean the litter box for you.  Do not travel far distances unless it is absolutely necessary and only with the approval of your health care provider.  Do not use hot tubs, steam rooms, or saunas.  Do not drink alcohol.  Do not use any products that contain nicotine or tobacco, such as cigarettes and e-cigarettes. If you need help quitting, ask your health care provider.  Do not use any medicinal herbs or unprescribed drugs. These chemicals affect the formation and growth of the baby.  Do not douche or use tampons or scented sanitary pads.  Do not cross your legs for long periods of time.  To prepare for the arrival of your baby: ? Take prenatal classes to understand, practice, and ask questions about labor and delivery. ? Make a trial run to the hospital. ? Visit the hospital and tour the maternity area. ? Arrange for maternity or paternity leave through employers. ? Arrange for family and friends to take care of pets while you are in the hospital. ? Purchase a rear-facing car seat and make sure you know how to install it in your car. ? Pack your hospital bag. ? Prepare the baby's nursery. Make sure to remove  all pillows and stuffed animals from the baby's crib to prevent suffocation.  Visit your dentist if you have not gone during your pregnancy. Use a soft toothbrush to brush your teeth and be gentle when you floss. Contact a health care provider  if:  You are unsure if you are in labor or if your water has broken.  You become dizzy.  You have mild pelvic cramps, pelvic pressure, or nagging pain in your abdominal area.  You have lower back pain.  You have persistent nausea, vomiting, or diarrhea.  You have an unusual or bad smelling vaginal discharge.  You have pain when you urinate. Get help right away if:  Your water breaks before 37 weeks.  You have regular contractions less than 5 minutes apart before 37 weeks.  You have a fever.  You are leaking fluid from your vagina.  You have spotting or bleeding from your vagina.  You have severe abdominal pain or cramping.  You have rapid weight loss or weight gain.  You have shortness of breath with chest pain.  You notice sudden or extreme swelling of your face, hands, ankles, feet, or legs.  Your baby makes fewer than 10 movements in 2 hours.  You have severe headaches that do not go away when you take medicine.  You have vision changes. Summary  The third trimester is from week 28 through week 40, months 7 through 9. The third trimester is a time when the unborn baby (fetus) is growing rapidly.  During the third trimester, your discomfort may increase as you and your baby continue to gain weight. You may have abdominal, leg, and back pain, sleeping problems, and an increased need to urinate.  During the third trimester your breasts will keep growing and they will continue to become tender. A yellow fluid (colostrum) may leak from your breasts. This is the first milk you are producing for your baby.  False labor is a condition in which you feel small, irregular tightenings of the muscles in the womb (contractions) that eventually go away. These are called Braxton Hicks contractions. Contractions may last for hours, days, or even weeks before true labor sets in.  Signs of labor can include: abdominal cramps; regular contractions that start at 10 minutes apart and  become stronger and more frequent with time; watery or bloody mucus discharge that comes from the vagina; increased pelvic pressure and dull back pain; and leaking of amniotic fluid. This information is not intended to replace advice given to you by your health care provider. Make sure you discuss any questions you have with your health care provider. Document Released: 09/04/2001 Document Revised: 02/16/2016 Document Reviewed: 11/11/2012 Elsevier Interactive Patient Education  2017 [employer]  Influenza (Flu) Vaccine (Inactivated or Recombinant): What You Need to Know  1. Why get vaccinated? Influenza ("flu") is a contagious disease that spreads around the [place] every year, usually between October and May. Flu is caused by influenza viruses, and is spread mainly by coughing, sneezing, and close contact. Anyone can get flu. Flu strikes suddenly and can last several days. Symptoms vary by age, but can include:  fever/chills  sore throat  muscle aches  fatigue  cough  headache  runny or stuffy nose  Flu can also lead to pneumonia and blood infections, and cause diarrhea and seizures in children. If you have a medical condition, such as heart or lung disease, flu can make it worse. Flu is more dangerous for some people. Infants and young  children, people 23 years of age and older, pregnant women, and people with certain health conditions or a weakened immune system are at greatest risk. Each year thousands of people in the [place] die from flu, and many more are hospitalized. Flu vaccine can:  keep you from getting flu,  make flu less severe if you do get it, and  keep you from spreading flu to your family and other people. 2. Inactivated and recombinant flu vaccines A dose of flu vaccine is recommended every flu season. Children 6 months through 528 years of age may need two doses during the same flu season. Everyone else needs only one dose each flu season. Some  inactivated flu vaccines contain a very small amount of a mercury-based preservative called thimerosal. Studies have not shown thimerosal in vaccines to be harmful, but flu vaccines that do not contain thimerosal are available. There is no live flu virus in flu shots. They cannot cause the flu. There are many flu viruses, and they are always changing. Each year a new flu vaccine is made to protect against three or four viruses that are likely to cause disease in the upcoming flu season. But even when the vaccine doesn't exactly match these viruses, it may still provide some protection. Flu vaccine cannot prevent:  flu that is caused by a virus not covered by the vaccine, or  illnesses that look like flu but are not.  It takes about 2 weeks for protection to develop after vaccination, and protection lasts through the flu season. 3. Some people should not get this vaccine Tell the person who is giving you the vaccine:  If you have any severe, life-threatening allergies. If you ever had a life-threatening allergic reaction after a dose of flu vaccine, or have a severe allergy to any part of this vaccine, you may be advised not to get vaccinated. Most, but not all, types of flu vaccine contain a small amount of egg protein.  If you ever had Guillain-Barr Syndrome (also called GBS). Some people with a history of GBS should not get this vaccine. This should be discussed with your doctor.  If you are not feeling well. It is usually okay to get flu vaccine when you have a mild illness, but you might be asked to come back when you feel better.  4. Risks of a vaccine reaction With any medicine, including vaccines, there is a chance of reactions. These are usually mild and go away on their own, but serious reactions are also possible. Most people who get a flu shot do not have any problems with it. Minor problems following a flu shot include:  soreness, redness, or swelling where the shot was  given  hoarseness  sore, red or itchy eyes  cough  fever  aches  headache  itching  fatigue  If these problems occur, they usually begin soon after the shot and last 1 or 2 days. More serious problems following a flu shot can include the following:  There may be a small increased risk of Guillain-Barre Syndrome (GBS) after inactivated flu vaccine. This risk has been estimated at 1 or 2 additional cases per million people vaccinated. This is much lower than the risk of severe complications from flu, which can be prevented by flu vaccine.  Young children who get the flu shot along with pneumococcal vaccine (PCV13) and/or DTaP vaccine at the same time might be slightly more likely to have a seizure caused by fever. Ask your doctor for  more information. Tell your doctor if a child who is getting flu vaccine has ever had a seizure.  Problems that could happen after any injected vaccine:  People sometimes faint after a medical procedure, including vaccination. Sitting or lying down for about 15 minutes can help prevent fainting, and injuries caused by a fall. Tell your doctor if you feel dizzy, or have vision changes or ringing in the ears.  Some people get severe pain in the shoulder and have difficulty moving the arm where a shot was given. This happens very rarely.  Any medication can cause a severe allergic reaction. Such reactions from a vaccine are very rare, estimated at about 1 in a million doses, and would happen within a few minutes to a few hours after the vaccination. As with any medicine, there is a very remote chance of a vaccine causing a serious injury or death. The safety of vaccines is always being monitored. For more information, visit: http://floyd.org/ 5. What if there is a serious reaction? What should I look for? Look for anything that concerns you, such as signs of a severe allergic reaction, very high fever, or unusual behavior. Signs of a severe  allergic reaction can include hives, swelling of the face and throat, difficulty breathing, a fast heartbeat, dizziness, and weakness. These would start a few minutes to a few hours after the vaccination. What should I do?  If you think it is a severe allergic reaction or other emergency that can't wait, call 9-1-1 and get the person to the nearest hospital. Otherwise, call your doctor.  Reactions should be reported to the Vaccine Adverse Event Reporting System (VAERS). Your doctor should file this report, or you can do it yourself through the VAERS web site at www.vaers.LAgents.no, or by calling 1-(364) 794-9377. ? VAERS does not give medical advice. 6. The National Vaccine Injury Compensation Program The [employer] Vaccine Injury Compensation Program (VICP) is a federal program that was created to compensate people who may have been injured by certain vaccines. Persons who believe they may have been injured by a vaccine can learn about the program and about filing a claim by calling 1-808-289-0206 or visiting the VICP website at SpiritualWord.at. There is a time limit to file a claim for compensation. 7. How can I learn more?  Ask your healthcare provider. He or she can give you the vaccine package insert or suggest other sources of information.  Call your local or state health department.  Contact the Centers for Disease Control and Prevention (CDC): ? Call 779-431-2978 (1-800-CDC-INFO) or ? Visit CDC's website at BiotechRoom.com.cy Vaccine Information Statement, Inactivated Influenza Vaccine (04/30/2014) This information is not intended to replace advice given to you by your health care provider. Make sure you discuss any questions you have with your health care provider. Document Released: 07/05/2006 Document Revised: 05/31/2016 Document Reviewed: 05/31/2016 Elsevier Interactive Patient Education  2017 [employer].

## 2018-06-10 NOTE — Progress Notes (Signed)
    PRENATAL VISIT NOTE  Subjective:  Lori Short is a 22 y.o. G2P1001 at 6519w1d being seen today for ongoing prenatal care.  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  Patient reports vaginal irritation.  Contractions: Not present. Vag. Bleeding: None.  Movement: Present. Denies leaking of fluid.   The following portions of the patient's history were reviewed and updated as appropriate: allergies, current medications, past family history, past medical history, past social history, past surgical history and problem list. Problem list updated.  Objective:   Vitals:   06/10/18 0955  BP: 106/72  Pulse: 91  Weight: 224 lb (101.6 kg)    Fetal Status: Fetal Heart Rate (bpm): 148   Movement: Present     General:  Alert, oriented and cooperative. Patient is in no acute distress.  Skin: Skin is warm and dry. No rash noted.   Cardiovascular: Normal heart rate noted  Respiratory: Normal respiratory effort, no problems with respiration noted  Abdomen: Soft, gravid, appropriate for gestational age.  Pain/Pressure: Present     Pelvic: Cervical exam deferred        Extremities: Normal range of motion.  Edema: None  Mental Status: Normal mood and affect. Normal behavior. Normal judgment and thought content.   Assessment and Plan:  Pregnancy: G2P1001 at 2319w1d  1. Supervision of other normal pregnancy, antepartum 28 wk labs - Glucose Tolerance,  2 Hours w/1 Hour; Future - RPR; Future - CBC; Future - HIV Antibody (routine testing w rflx); Future - Tdap vaccine greater than or equal to 7yo IM - Glucose Tolerance, 2 Hours w/1 Hour - RPR - CBC - HIV Antibody (routine testing w rflx)  2. 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 Need for immunization against influenza - Flu Vaccine QUAD 36+ mos IM  4. Bacterial vaginosis PO meds make her nauseous Stop taking sit down baths. - metroNIDAZOLE (METROGEL) 0.75 % vaginal gel; Place 1 Applicatorful vaginally at bedtime. Apply one applicatorful to vagina at bedtime for 5 days  Dispense: 70 g; Refill: 1  Preterm labor symptoms and general obstetric precautions including but not limited to vaginal bleeding, contractions, leaking of fluid and fetal movement were reviewed in detail with the patient. Please refer to After Visit Summary for other counseling recommendations.  Return in 2 weeks (on 06/24/2018).  Future Appointments  Date Time Provider Department Center  06/24/2018 10:30 AM [facility] US 1 [facility]    Reva Boresanya S Leeandra Ellerson, MD

## 2018-06-11 LAB — CBC
Hematocrit: 29.6 % — ABNORMAL LOW (ref 34.0–46.6)
Hemoglobin: 8.8 g/dL — ABNORMAL LOW (ref 11.1–15.9)
MCH: 20.7 pg — AB (ref 26.6–33.0)
MCHC: 29.7 g/dL — ABNORMAL LOW (ref 31.5–35.7)
MCV: 70 fL — ABNORMAL LOW (ref 79–97)
PLATELETS: 359 10*3/uL (ref 150–450)
RBC: 4.25 x10E6/uL (ref 3.77–5.28)
RDW: 17.3 % — ABNORMAL HIGH (ref 12.3–15.4)
WBC: 8.6 10*3/uL (ref 3.4–10.8)

## 2018-06-11 LAB — RPR: RPR: NONREACTIVE

## 2018-06-11 LAB — HIV ANTIBODY (ROUTINE TESTING W REFLEX): HIV Screen 4th Generation wRfx: NONREACTIVE

## 2018-06-11 LAB — GLUCOSE TOLERANCE, 2 HOURS W/ 1HR
GLUCOSE, 1 HOUR: 99 mg/dL (ref 65–179)
Glucose, 2 hour: 86 mg/dL (ref 65–152)
Glucose, Fasting: 81 mg/dL (ref 65–91)

## 2018-06-11 NOTE — Addendum Note (Signed)
Addended by: Reva BoresPRATT, Varick Keys S on: 06/11/2018 11:03 AM   Modules accepted: Orders

## 2018-06-12 ENCOUNTER — Telehealth (HOSPITAL_COMMUNITY): Payer: Self-pay | Admitting: *Deleted

## 2018-06-12 NOTE — Telephone Encounter (Signed)
Name and DOB verified.  Normal microarray from amnio given.  Pt voiced understanding.  [facility], [facility] [place]

## 2018-06-13 ENCOUNTER — Other Ambulatory Visit: Payer: Self-pay

## 2018-06-13 ENCOUNTER — Other Ambulatory Visit (HOSPITAL_COMMUNITY): Payer: Self-pay | Admitting: General Practice

## 2018-06-13 MED ORDER — SODIUM CHLORIDE 0.9 % IV SOLN: 510.0000 mg | INTRAVENOUS | Status: AC

## 2018-06-13 NOTE — Addendum Note (Signed)
Addended by: Reva BoresPRATT, TANYA S on: 06/13/2018 09:21 AM   Modules accepted: Orders

## 2018-06-13 NOTE — Addendum Note (Signed)
Addended by: Reva BoresPRATT, Seila Liston S on: 06/13/2018 09:19 AM   Modules accepted: Orders

## 2018-06-17 ENCOUNTER — Other Ambulatory Visit: Payer: Self-pay

## 2018-06-18 ENCOUNTER — Telehealth: Payer: Self-pay

## 2018-06-18 NOTE — Telephone Encounter (Signed)
-----   Message from Lindell Spar, [place] sent at 06/17/2018  3:03 PM EDT ----- Regarding: rx question Patient is wanting call back needs to ask question about medication she would like prescribed.

## 2018-06-18 NOTE — Telephone Encounter (Signed)
Returned patient called regarding lab work. No answer or voice mail to leave a message.

## 2018-06-23 ENCOUNTER — Telehealth: Payer: Self-pay

## 2018-06-23 NOTE — Telephone Encounter (Signed)
-----   Message from Heather L Bacon, NT sent at 06/17/2018  3:03 PM EDT ----- Regarding: rx question Patient is wanting call back needs to ask question about medication she would like prescribed.  

## 2018-06-23 NOTE — Telephone Encounter (Signed)
Returned patient call regarding medication she was prescribed - LMOVMTC if she still has questions/concerns.

## 2018-06-24 ENCOUNTER — Encounter (HOSPITAL_COMMUNITY): Payer: Self-pay

## 2018-06-24 ENCOUNTER — Ambulatory Visit (HOSPITAL_COMMUNITY)
Admission: RE | Admit: 2018-06-24 | Discharge: 2018-06-24 | Disposition: A | Discharge status: Home / Self Care | Payer: Medicaid Other | Source: Ambulatory Visit | Attending: Family Medicine | Admitting: Family Medicine

## 2018-06-24 ENCOUNTER — Encounter: Payer: Self-pay | Admitting: Obstetrics & Gynecology

## 2018-06-24 NOTE — Progress Notes (Deleted)
   Patient did not show up today for her scheduled appointment.   Kamori Barbier, MD, FACOG Obstetrician & Gynecologist, [facility], [facility]  

## 2018-06-26 ENCOUNTER — Encounter (HOSPITAL_COMMUNITY): Payer: Self-pay

## 2018-06-26 ENCOUNTER — Ambulatory Visit (HOSPITAL_COMMUNITY)
Admission: RE | Admit: 2018-06-26 | Discharge: 2018-06-26 | Disposition: A | Discharge status: Home / Self Care | Payer: Medicaid Other | Source: Ambulatory Visit | Attending: Family Medicine | Admitting: Family Medicine

## 2018-06-26 DIAGNOSIS — D509 Iron deficiency anemia, unspecified: Secondary | ICD-10-CM | POA: Insufficient documentation

## 2018-06-26 MED ORDER — SODIUM CHLORIDE 0.9 % IV SOLN
510.0000 mg | INTRAVENOUS | Status: DC
  Administered 2018-06-26: 510 mg via INTRAVENOUS
  Filled 2018-06-26: qty 17

## 2018-06-26 MED ORDER — SODIUM CHLORIDE 0.9 % IV SOLN
INTRAVENOUS | Status: DC
  Administered 2018-06-26: 09:00:00 via INTRAVENOUS

## 2018-06-26 NOTE — Discharge Instructions (Signed)

## 2018-06-26 NOTE — Progress Notes (Signed)
Here to perform pre and post doppler FHTs for feraheme infusion.

## 2018-06-30 ENCOUNTER — Encounter (HOSPITAL_COMMUNITY): Payer: Self-pay

## 2018-07-03 ENCOUNTER — Ambulatory Visit (HOSPITAL_COMMUNITY): Payer: Medicaid Other

## 2018-07-04 ENCOUNTER — Inpatient Hospital Stay (HOSPITAL_COMMUNITY)
Admission: RE | Admit: 2018-07-04 | Discharge: 2018-07-04 | Disposition: A | Discharge status: Home / Self Care | Payer: Medicaid Other | Source: Ambulatory Visit | Attending: Obstetrics and Gynecology | Admitting: Obstetrics and Gynecology

## 2018-07-04 ENCOUNTER — Encounter (HOSPITAL_COMMUNITY): Payer: Self-pay

## 2018-07-11 NOTE — Progress Notes (Signed)
Patient is sent in for feraheme infusion due to to significant iron deficiency anemia in pregnancy. Hgb is 8.8 currently with low MCV c/w with iron deficiency.

## 2018-12-08 ENCOUNTER — Encounter (HOSPITAL_COMMUNITY): Payer: Self-pay

## 2019-05-07 ENCOUNTER — Ambulatory Visit (HOSPITAL_COMMUNITY)
Admission: EM | Admit: 2019-05-07 | Discharge: 2019-05-07 | Disposition: A | Discharge status: Home / Self Care | Payer: Medicaid Other | Attending: Family Medicine | Admitting: Family Medicine

## 2019-05-07 ENCOUNTER — Other Ambulatory Visit: Payer: Self-pay

## 2019-05-07 DIAGNOSIS — N760 Acute vaginitis: Secondary | ICD-10-CM

## 2019-05-07 DIAGNOSIS — N3090 Cystitis, unspecified without hematuria: Secondary | ICD-10-CM

## 2019-05-07 DIAGNOSIS — B9689 Other specified bacterial agents as the cause of diseases classified elsewhere: Secondary | ICD-10-CM

## 2019-05-07 LAB — POCT URINALYSIS DIP (DEVICE)
Bilirubin Urine: NEGATIVE
Bilirubin Urine: NEGATIVE
Glucose, UA: NEGATIVE mg/dL
Glucose, UA: NEGATIVE mg/dL
Ketones, ur: NEGATIVE mg/dL
Ketones, ur: NEGATIVE mg/dL
Nitrite: POSITIVE — AB
Nitrite: POSITIVE — AB
Protein, ur: NEGATIVE mg/dL
Protein, ur: NEGATIVE mg/dL
Specific Gravity, Urine: 1.025 (ref 1.005–1.030)
Specific Gravity, Urine: >=1.03 (ref 1.005–1.030)
Urobilinogen, UA: 0.2 mg/dL (ref 0.0–1.0)
Urobilinogen, UA: 0.2 mg/dL (ref 0.0–1.0)
pH: 6 (ref 5.0–8.0)
pH: 6.5 (ref 5.0–8.0)

## 2019-05-07 MED ORDER — FLUCONAZOLE 150 MG PO TABS: 150.0000 mg | ORAL_TABLET | Freq: Every day | ORAL | 0 refills | Status: DC

## 2019-05-07 MED ORDER — METRONIDAZOLE 500 MG PO TABS: 500.0000 mg | ORAL_TABLET | Freq: Two times a day (BID) | ORAL | 0 refills | Status: DC

## 2019-05-07 MED ORDER — NITROFURANTOIN MONOHYD MACRO 100 MG PO CAPS: 100.0000 mg | ORAL_CAPSULE | Freq: Two times a day (BID) | ORAL | 0 refills | Status: DC

## 2019-05-07 NOTE — ED Triage Notes (Signed)
Pt states she has bacteria  vaginosis x 1 week. Pt states she has a UTI as well x 3 days. Pt states she has a vaginal odor.

## 2019-05-07 NOTE — Discharge Instructions (Signed)
Drink plenty of fluids Take the nitrofurantoin antibiotic 2 times a day.  This will treat your bladder infection Take the metronidazole 2 times a day for 7 days.  This is for the BV. Take the Diflucan as directed, 1 pill now and then 1 pill in 7 days.  This is for yeast infections There is evidence that taking a probiotic for women once a day will help prevent BV We have sent a swab to the lab for additional testing.  You will be called if any tests come back positive

## 2019-05-07 NOTE — ED Provider Notes (Signed)
[facility]    CSN: [id number] Arrival date & time: 05/07/19  1556      History   Chief Complaint Chief Complaint  Patient presents with  . Vaginitis  . Urinary Tract Infection    HPI Lori Short is a 23 y.o. female.   HPI  Patient is here for vaginal infection.  She has a vaginal discharge and odor.  Mild vaginal irritation.  She thinks it is BV.  She has had this before. She has reconnected with an old sexual partner.  She does not think she has an STD but would like testing as long as she is here.  No abdominal pain.  No nausea vomiting diarrhea.  No rash.  No fever chills She think she also has a bladder infection.  She has dysuria at the end of urination and slight frequency.  No flank pain.  Past Medical History:  Diagnosis Date  . Bronchitis   . Headache     Patient Active Problem List   Diagnosis Date Noted  . 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 Pyelectasis of fetus on prenatal ultrasound 05/01/2018  . Anemia affecting pregnancy in first trimester 03/15/2018  . Supervision of other normal pregnancy, antepartum 03/12/2018  . History of gestational hypertension 03/18/2017  . Proteinuria affecting pregnancy in third trimester 03/18/2017  . Edema during pregnancy in third trimester 03/18/2017  . Rubella non-immune status, antepartum 11/20/2016  . Substance abuse affecting pregnancy, antepartum 11/20/2016  . 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 Smoker 09/30/2014  . Maternal obesity, antepartum 09/30/2014    Past Surgical History:  Procedure Laterality Date  . NO PAST SURGERIES      OB History    Gravida  2   Para  1   Term  1   Preterm      AB      Living  1     SAB      TAB      Ectopic      Multiple  0   Live Births  1            Home Medications    Prior to Admission medications   Medication Sig Start Date End Date Taking? Authorizing Provider  albuterol (PROVENTIL  HFA;VENTOLIN HFA) 108 (90 Base) MCG/ACT inhaler Inhale 2 puffs into the lungs every 6 (six) hours as needed for wheezing or shortness of breath. 01/03/18   Menshew, Dannielle Karvonen, PA-C  aspirin EC 81 MG tablet Take 1 tablet (81 mg total) by mouth daily. 05/13/18   Donnamae Jude, MD  diphenhydrAMINE-APAP, sleep, (TYLENOL PM EXTRA STRENGTH PO) Take by mouth.    [provider]  fluconazole (DIFLUCAN) 150 MG tablet Take 1 tablet (150 mg total) by mouth daily. Repeat in 1 week if needed 05/07/19   Raylene Everts, MD  metroNIDAZOLE (FLAGYL) 500 MG tablet Take 1 tablet (500 mg total) by mouth 2 (two) times daily. 05/07/19   Raylene Everts, MD  nitrofurantoin, macrocrystal-monohydrate, (MACROBID) 100 MG capsule Take 1 capsule (100 mg total) by mouth 2 (two) times daily. 05/07/19   Raylene Everts, MD  ferrous fumarate (HEMOCYTE - 106 MG FE) 325 (106 Fe) MG TABS tablet Take 1 tablet (106 mg of iron total) by mouth 2 (two) times daily. Take with juice; avoid dairy 30 min before and after taking pill. Patient not taking: Reported on 05/27/2018 03/13/18 05/07/19  Starr Lake, CNM  Family History Family History  Problem Relation Age of Onset  . Hypertension Maternal Grandmother   . Heart disease Maternal Grandmother     Social History Social History   Tobacco Use  . Smoking status: Former Smoker    Packs/day: 0.25    Years: 1.00    Pack years: 0.25    Types: Cigarettes    Quit date: 04/14/2015    Years since quitting: 4.0  . Smokeless tobacco: Former Engineer, waterUser  Substance Use Topics  . Alcohol use: Never    Frequency: Never    Comment: occasional   . Drug use: No     Allergies   Shellfish allergy   Review of Systems Review of Systems  Constitutional: Negative for chills and fever.  HENT: Negative for ear pain and sore throat.   Eyes: Negative for pain and visual disturbance.  Respiratory: Negative for cough and shortness of breath.   Cardiovascular: Negative  for chest pain and palpitations.  Gastrointestinal: Negative for abdominal pain and vomiting.  Genitourinary: Positive for dysuria, frequency and vaginal discharge. Negative for difficulty urinating, flank pain, genital sores, hematuria and pelvic pain.  Musculoskeletal: Negative for arthralgias and back pain.  Skin: Negative for color change and rash.  Neurological: Negative for seizures and syncope.  All other systems reviewed and are negative.    Physical Exam Triage Vital Signs ED Triage Vitals  Enc Vitals Group     BP 05/07/19 1626 123/82     Pulse Rate 05/07/19 1626 88     Resp 05/07/19 1626 18     Temp 05/07/19 1626 98.4 F (36.9 C)     Temp Source 05/07/19 1626 Oral     SpO2 05/07/19 1626 99 %     Weight 05/07/19 1624 240 lb (108.9 kg)     Height --      Head Circumference --      Peak Flow --      Pain Score 05/07/19 1624 2     Pain Loc --      Pain Edu? --      Excl. in GC? --    No data found.  Updated Vital Signs BP 123/82   Pulse 88   Temp 98.4 F (36.9 C) (Oral)   Resp 18   Wt 108.9 kg   LMP 04/23/2019   SpO2 99%   BMI 41.20 kg/m      Physical Exam Constitutional:      General: She is not in acute distress.    Appearance: She is well-developed. She is obese.  HENT:     Head: Normocephalic and atraumatic.  Eyes:     Conjunctiva/sclera: Conjunctivae normal.     Pupils: Pupils are equal, round, and reactive to light.  Neck:     Musculoskeletal: Normal range of motion.  Cardiovascular:     Rate and Rhythm: Normal rate and regular rhythm.     Heart sounds: Normal heart sounds.  Pulmonary:     Effort: Pulmonary effort is normal. No respiratory distress.     Breath sounds: Normal breath sounds.  Abdominal:     General: There is no distension.     Palpations: Abdomen is soft.     Tenderness: There is no right CVA tenderness or left CVA tenderness.  Genitourinary:    Comments: Exam deferred Musculoskeletal: Normal range of motion.  Skin:     General: Skin is warm and dry.  Neurological:     Mental Status: She is alert.  Psychiatric:  Mood and Affect: Mood normal.        Behavior: Behavior normal.      UC Treatments / Results  Labs (all labs ordered are listed, but only abnormal results are displayed) Labs Reviewed  POCT URINALYSIS DIP (DEVICE) - Abnormal; Notable for the following components:      Result Value   Hgb urine dipstick TRACE (*)    Nitrite POSITIVE (*)    Leukocytes,Ua SMALL (*)    All other components within normal limits  POCT URINALYSIS DIP (DEVICE) - Abnormal; Notable for the following components:   Hgb urine dipstick TRACE (*)    Nitrite POSITIVE (*)    Leukocytes,Ua SMALL (*)    All other components within normal limits  URINE CULTURE  CERVICOVAGINAL ANCILLARY ONLY    EKG   Radiology No results found.  Procedures Procedures (including critical care time)  Medications Ordered in UC Medications - No data to display  Initial Impression / Assessment and Plan / UC Course  I have reviewed the triage vital signs and the nursing notes.  Pertinent labs & imaging results that were available during my care of the patient were reviewed by me and considered in my medical decision making (see chart for details).     With vaginal discharge and odor I believe she has BV.  Possible yeast infection.  Will treat for both.  She definitely has cystitis based on her urinalysis.  Will send culture.  Safe sex is recommended. Final Clinical Impressions(s) / UC Diagnoses   Final diagnoses:  BV (bacterial vaginosis)  Cystitis     Discharge Instructions     Drink plenty of fluids Take the nitrofurantoin antibiotic 2 times a day.  This will treat your bladder infection Take the metronidazole 2 times a day for 7 days.  This is for the BV. Take the Diflucan as directed, 1 pill now and then 1 pill in 7 days.  This is for yeast infections There is evidence that taking a probiotic for women once a day  will help prevent BV We have sent a swab to the lab for additional testing.  You will be called if any tests come back positive    ED Prescriptions    Medication Sig Dispense Auth. Provider   nitrofurantoin, macrocrystal-monohydrate, (MACROBID) 100 MG capsule Take 1 capsule (100 mg total) by mouth 2 (two) times daily. 10 capsule Eustace MooreNelson, Meera Vasco Sue, MD   metroNIDAZOLE (FLAGYL) 500 MG tablet Take 1 tablet (500 mg total) by mouth 2 (two) times daily. 14 tablet Eustace MooreNelson, Khori Underberg Sue, MD   fluconazole (DIFLUCAN) 150 MG tablet Take 1 tablet (150 mg total) by mouth daily. Repeat in 1 week if needed 2 tablet Eustace MooreNelson, Jasmon Graffam Sue, MD     Controlled Substance Prescriptions [place] Controlled Substance Registry consulted? Not Applicable   Eustace MooreNelson, Nikai Quest Sue, MD 05/07/19 [phone]

## 2019-05-09 LAB — CERVICOVAGINAL ANCILLARY ONLY
Chlamydia: NEGATIVE
Neisseria Gonorrhea: NEGATIVE
Trichomonas: NEGATIVE

## 2019-05-09 LAB — URINE CULTURE: Culture: >=100000 — AB

## 2019-09-08 IMAGING — CT CT HEAD W/O CM
3 series · 16 of 44 positions shown, 19 images · non-contrast
Comparison: None.

CLINICAL DATA: 22 y/o F; right arm pain and 2 days of tingling to
the right arm. Dizziness last week. Patient also reports blurry
vision for the last week.

EXAM:
CT HEAD WITHOUT CONTRAST
TECHNIQUE: Contiguous axial images were obtained from the base of the skull
through the vertex without intravenous contrast.

[Series 3: head wo · axial · 0.41mm/px · z∈[+520,+630]mm · 10 of 27 slices shown, 13 images]
[im 3/27  brain]
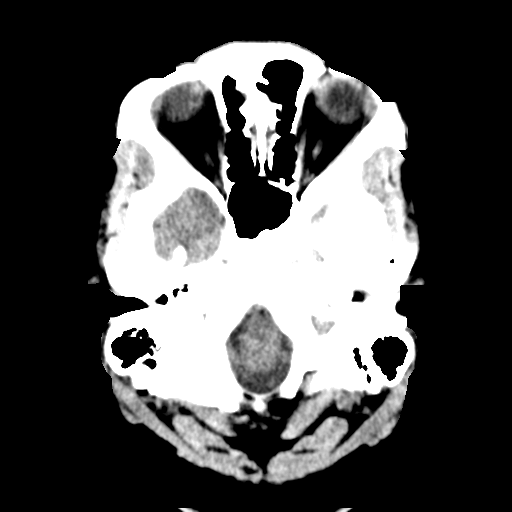
[im 3/27  bone]
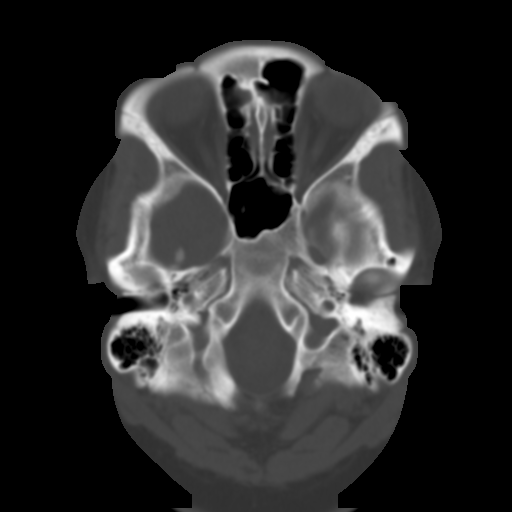
[im 5/27  brain]
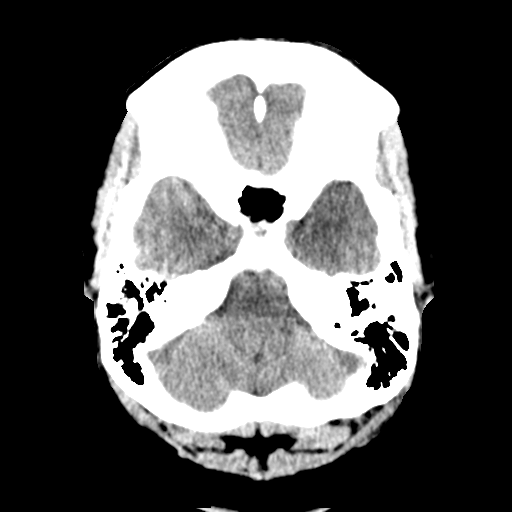
[im 8/27  brain]
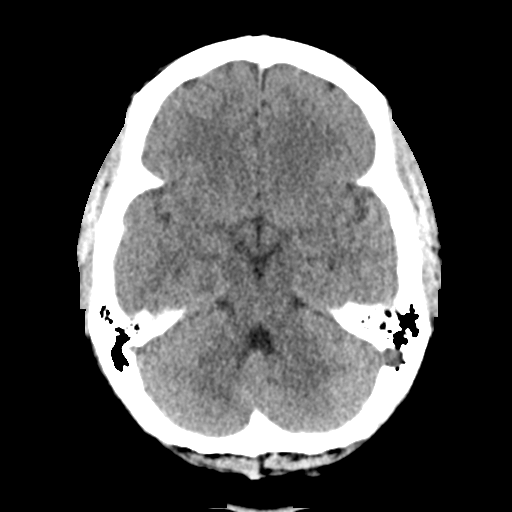
[im 10/27  brain]
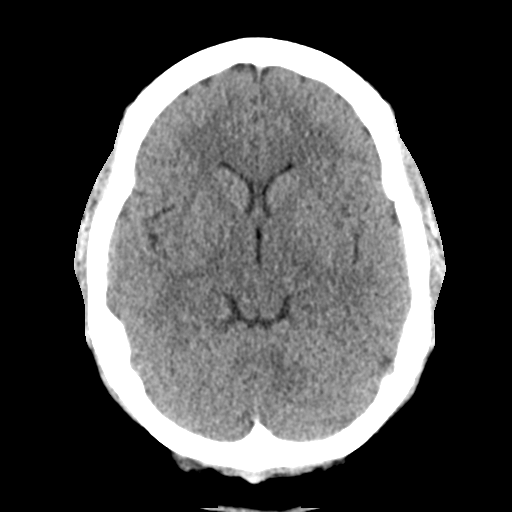
[im 13/27  brain]
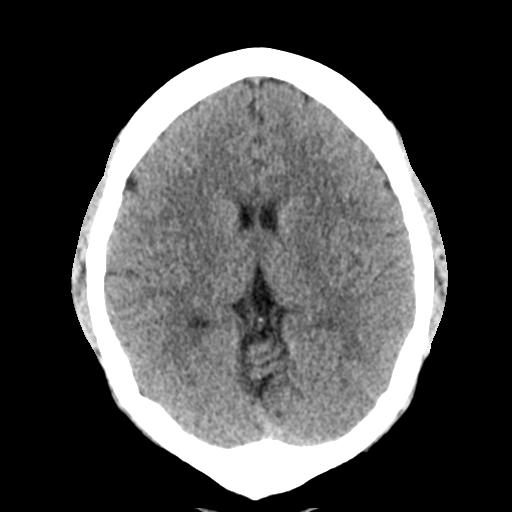
[im 13/27  bone]
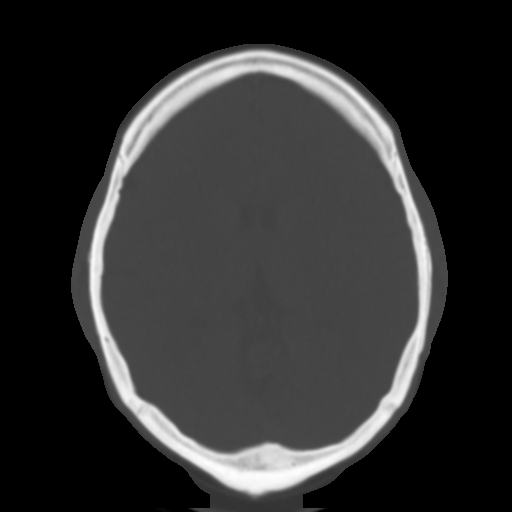
[im 15/27  brain]
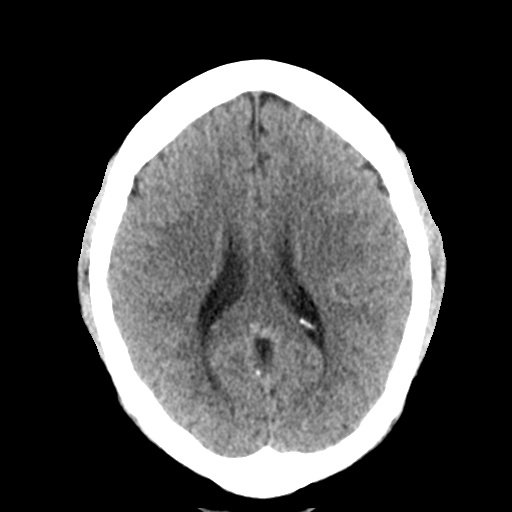
[im 18/27  brain]
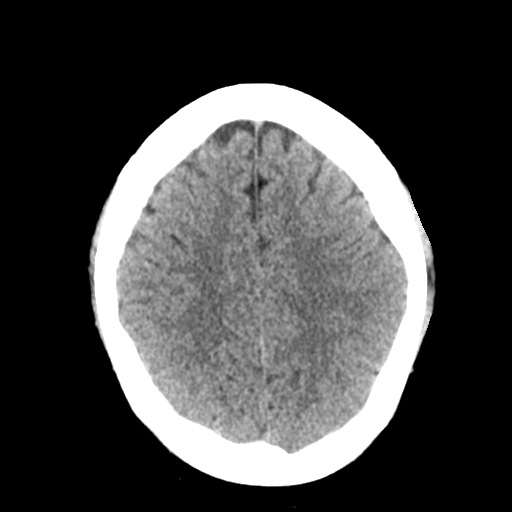
[im 20/27  brain]
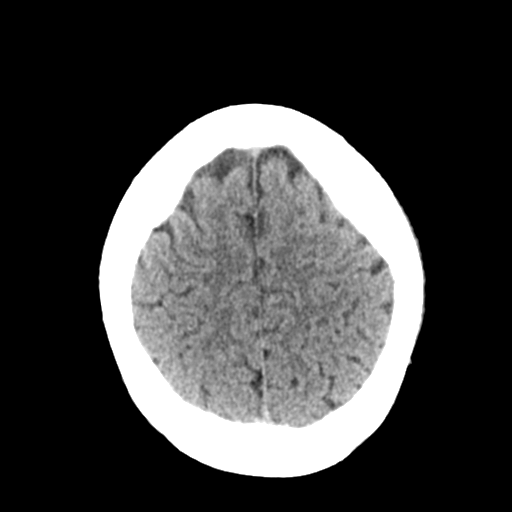
[im 23/27  brain]
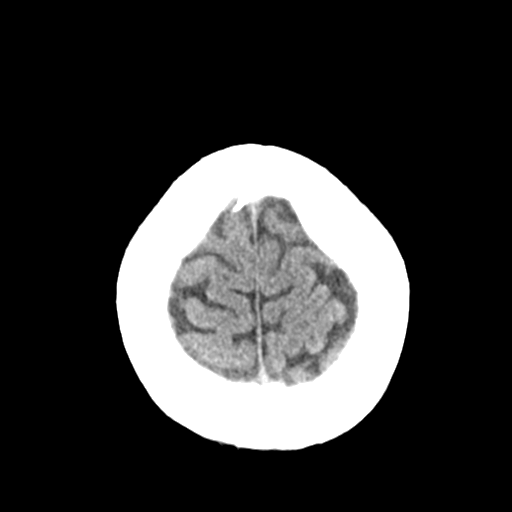
[im 23/27  bone]
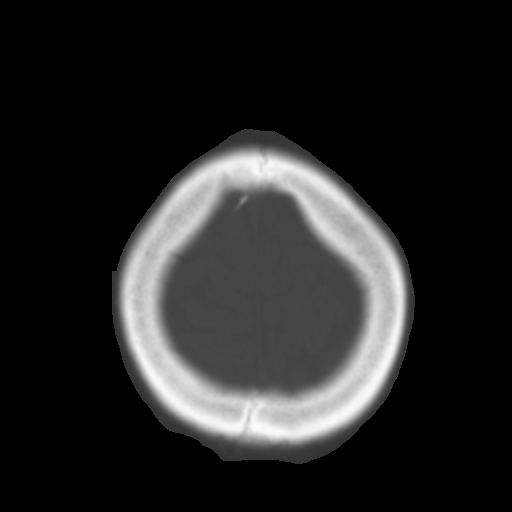
[im 25/27  brain]
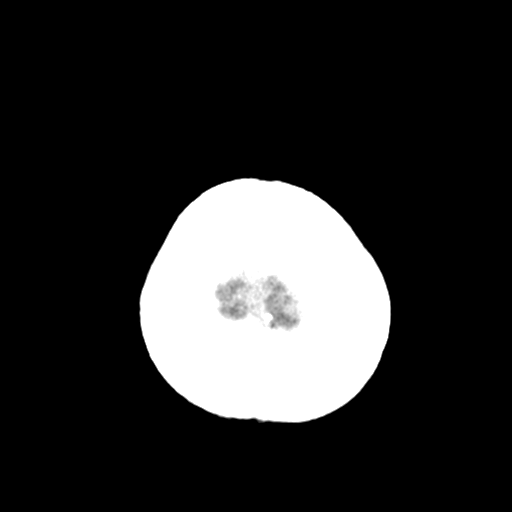

[Series 4: coronal soft tissue · coronal · 0.32mm/px · 3 of 58 slices shown]
[im 20/58  brain]
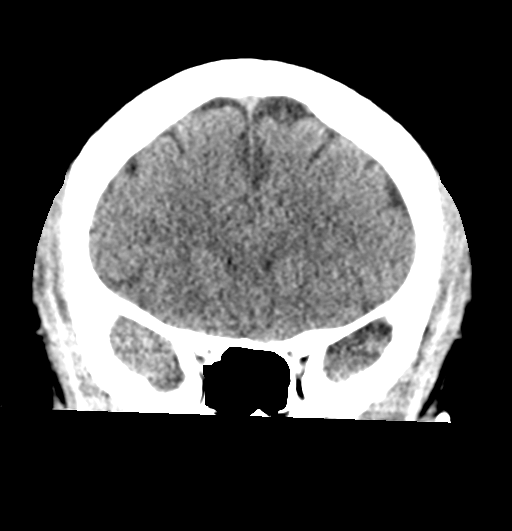
[im 26/58  brain]
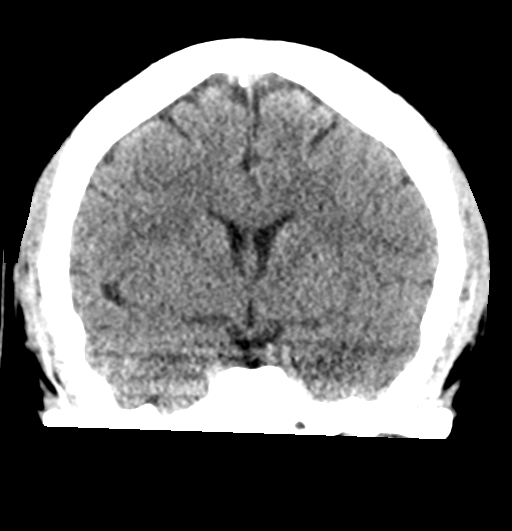
[im 32/58  brain]
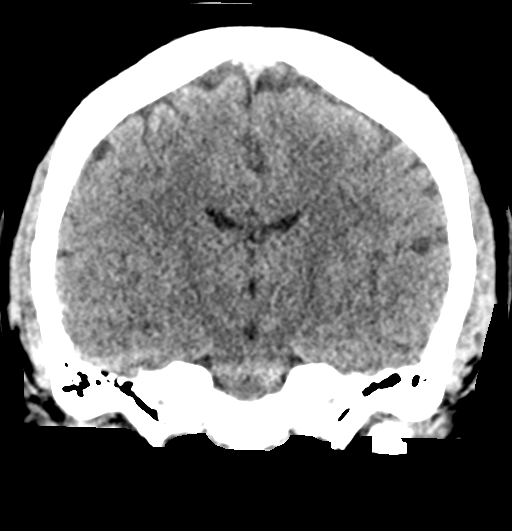

[Series 5: sagittal soft tissue · sagittal · 0.33mm/px · 3 of 56 slices shown]
[im 19/56  brain]
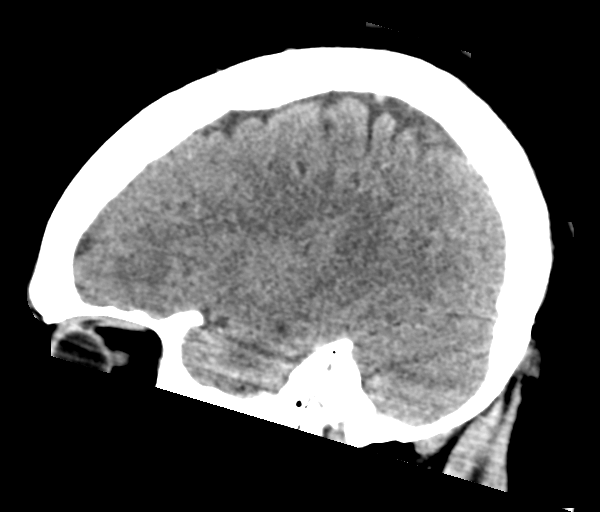
[im 28/56  brain]
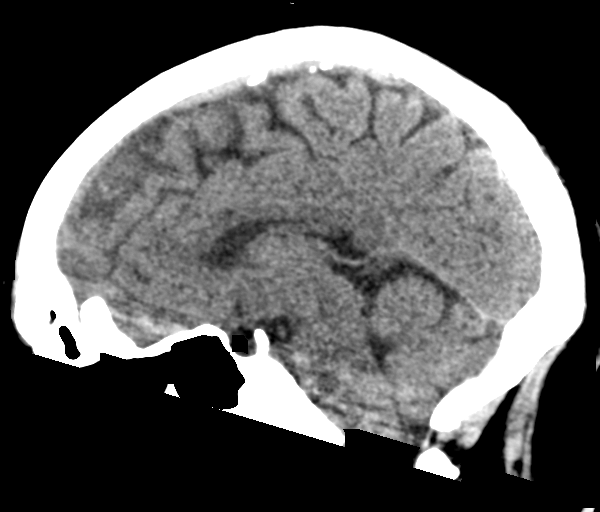
[im 37/56  brain]
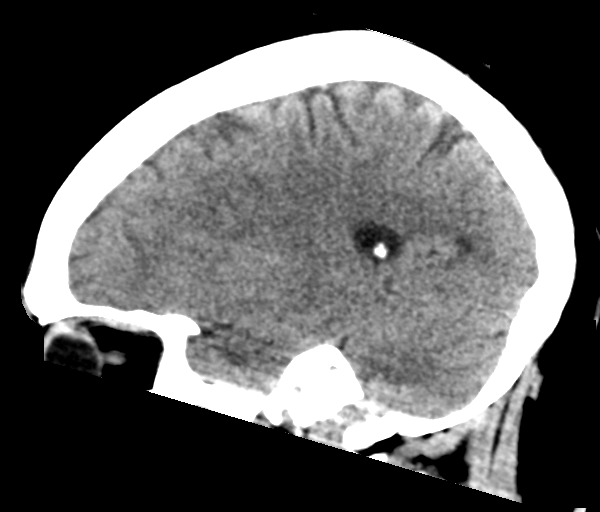

[16 of 44 positions shown; findings below may reference images not displayed]

FINDINGS: Brain: No evidence of acute infarction, hemorrhage, hydrocephalus,
extra-axial collection or mass lesion/mass effect.

Vascular: No hyperdense vessel or unexpected calcification.

Skull: Normal. Negative for fracture or focal lesion.

Sinuses/Orbits: No acute finding.

Other: None.
IMPRESSION: Normal CT of the head.

By: Martns Cabral Orioli M.D.

## 2019-09-21 IMAGING — CR DG CHEST 2V
1 series · 2 of 2 positions shown · non-contrast
Comparison: 09/21/2017

CLINICAL DATA: Shortness of breath and chest pain

EXAM:
CHEST - 2 VIEW

[Series 1: dg chest 2 view · 0.14mm/px · 2 of 2 slices shown]
[im 1/2]
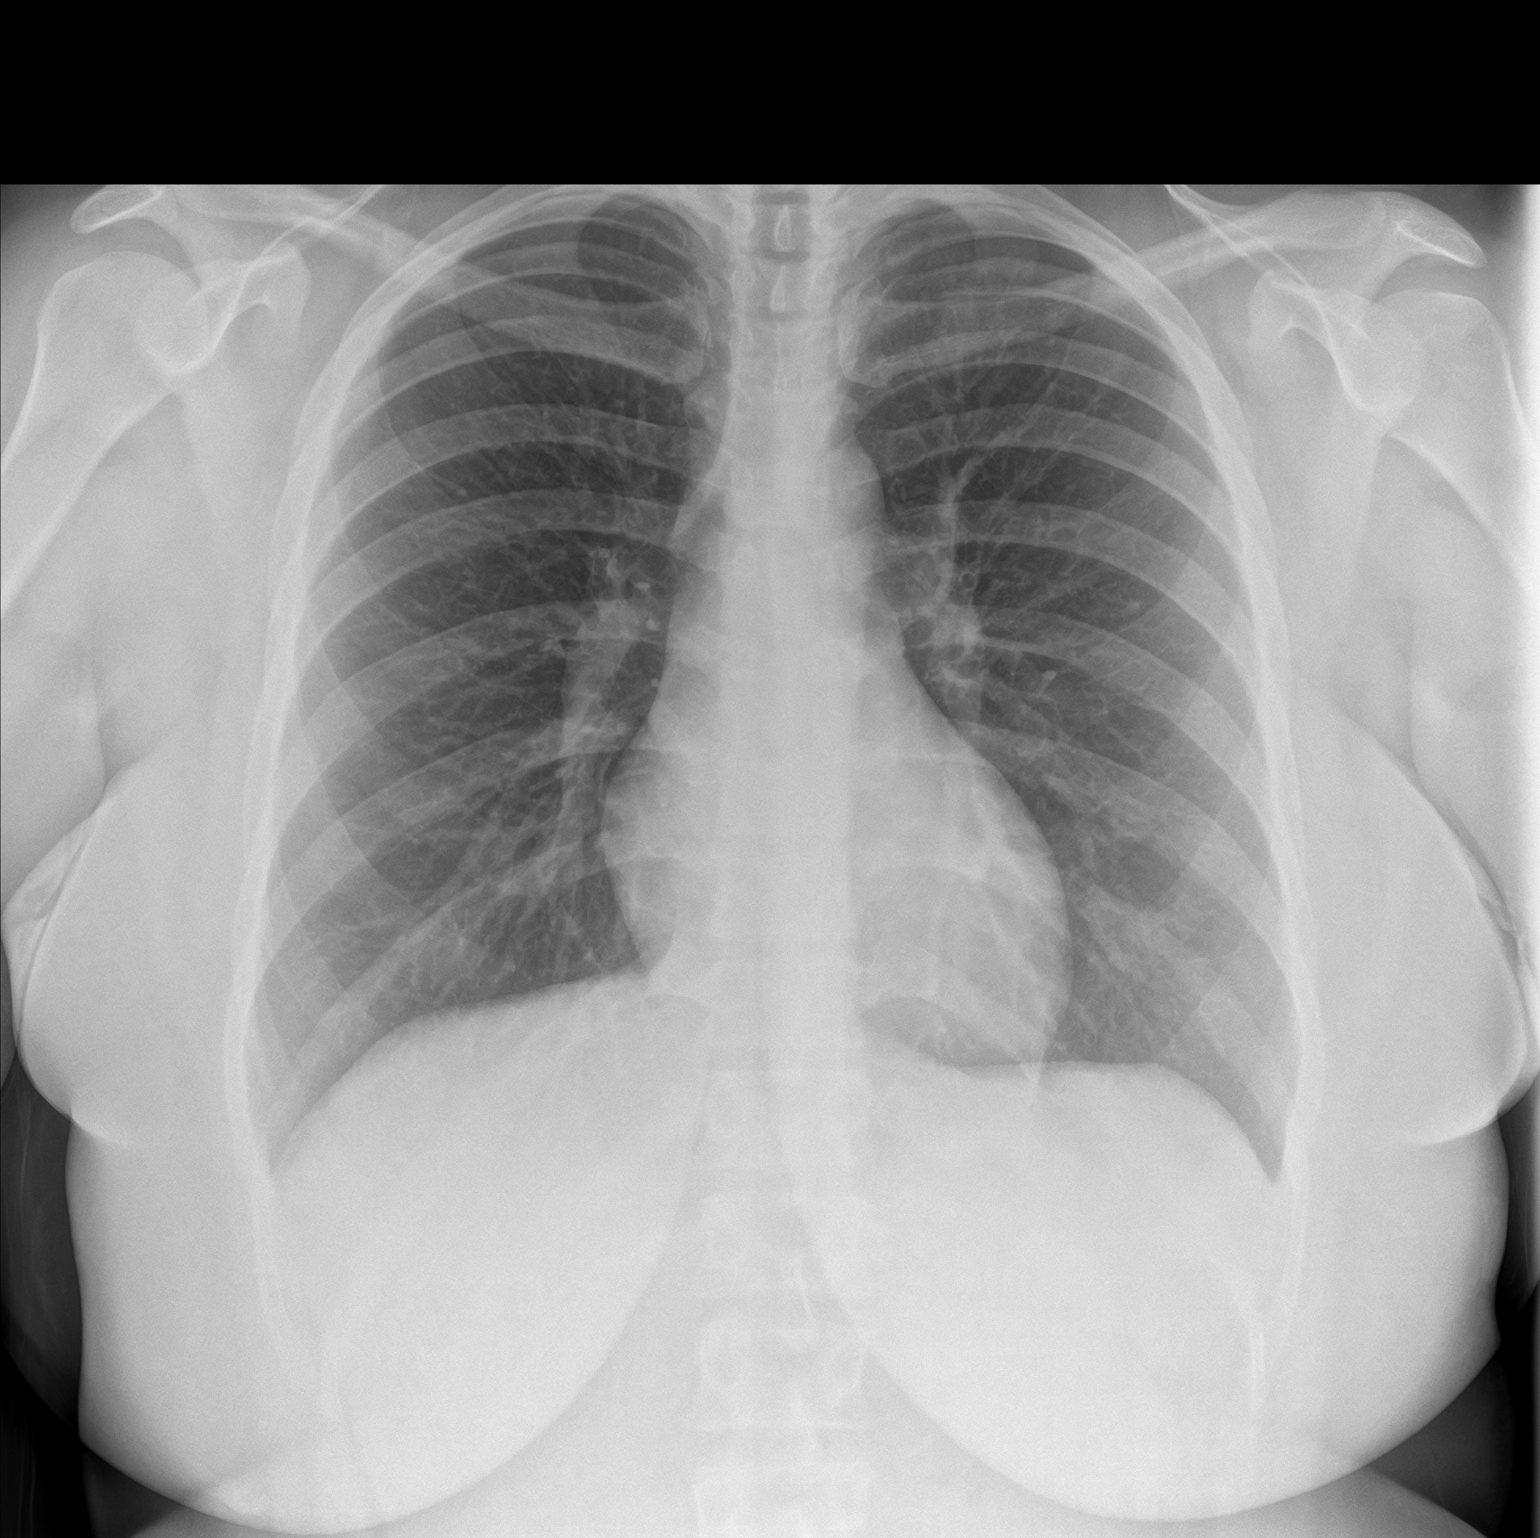
[im 2/2]
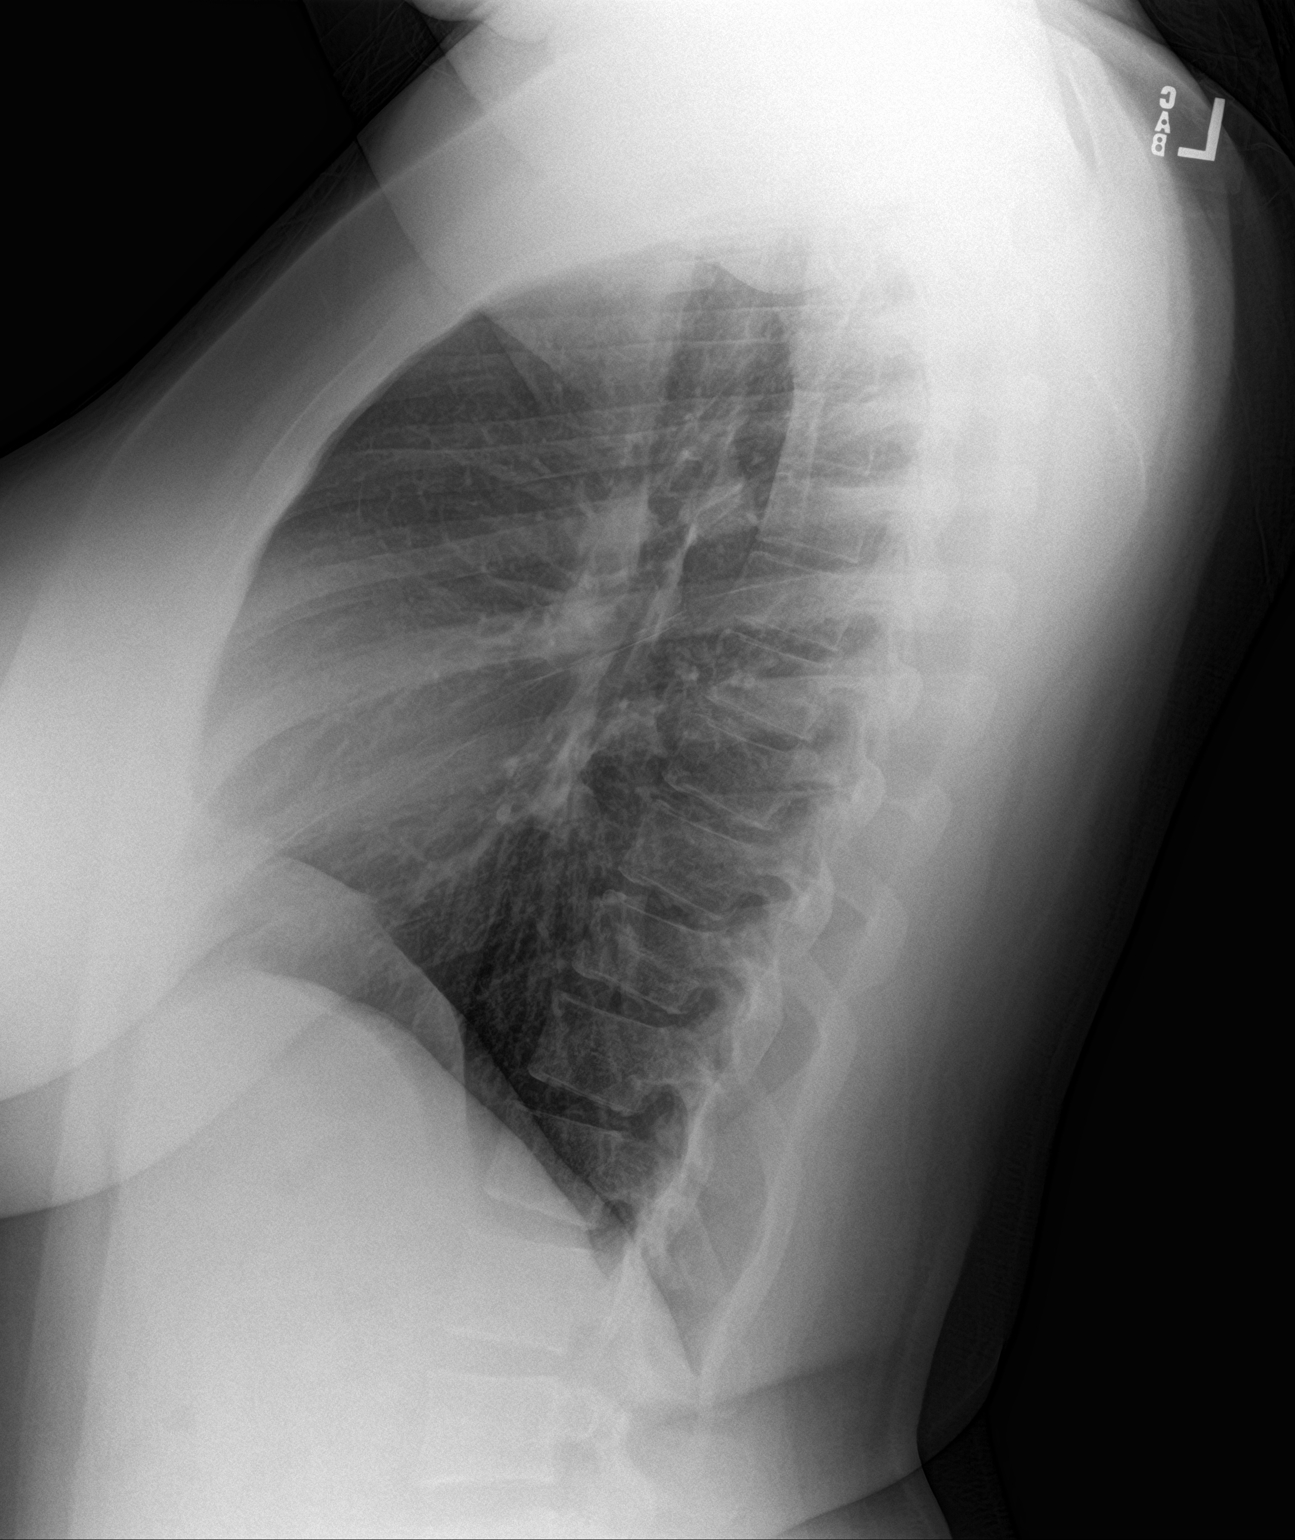

[2 of 2 positions shown; findings below may reference images not displayed]

FINDINGS: The heart size and mediastinal contours are within normal limits.
Both lungs are clear. The visualized skeletal structures are
unremarkable.
IMPRESSION: No active cardiopulmonary disease.

## 2019-09-23 ENCOUNTER — Ambulatory Visit: Payer: Self-pay | Attending: Internal Medicine

## 2019-09-24 ENCOUNTER — Ambulatory Visit: Payer: Self-pay | Attending: Internal Medicine

## 2019-09-24 DIAGNOSIS — Z20822 Contact with and (suspected) exposure to covid-19: Secondary | ICD-10-CM

## 2019-09-24 DIAGNOSIS — Z20828 Contact with and (suspected) exposure to other viral communicable diseases: Secondary | ICD-10-CM | POA: Insufficient documentation

## 2019-09-30 LAB — NOVEL CORONAVIRUS, NAA

## 2020-01-23 ENCOUNTER — Emergency Department (HOSPITAL_COMMUNITY)
Admission: EM | Admit: 2020-01-23 | Discharge: 2020-01-24 | Disposition: A | Discharge status: Home / Self Care | Payer: Medicaid Other | Attending: Emergency Medicine | Admitting: Emergency Medicine

## 2020-01-23 ENCOUNTER — Other Ambulatory Visit: Payer: Self-pay

## 2020-01-23 ENCOUNTER — Encounter (HOSPITAL_COMMUNITY): Payer: Self-pay | Admitting: Emergency Medicine

## 2020-01-23 ENCOUNTER — Emergency Department (HOSPITAL_COMMUNITY): Payer: Medicaid Other

## 2020-01-23 DIAGNOSIS — Y999 Unspecified external cause status: Secondary | ICD-10-CM | POA: Insufficient documentation

## 2020-01-23 DIAGNOSIS — Z79899 Other long term (current) drug therapy: Secondary | ICD-10-CM | POA: Insufficient documentation

## 2020-01-23 DIAGNOSIS — Y9383 Activity, rough housing and horseplay: Secondary | ICD-10-CM | POA: Diagnosis not present

## 2020-01-23 DIAGNOSIS — S82842A Displaced bimalleolar fracture of left lower leg, initial encounter for closed fracture: Secondary | ICD-10-CM | POA: Diagnosis not present

## 2020-01-23 DIAGNOSIS — Z7982 Long term (current) use of aspirin: Secondary | ICD-10-CM | POA: Diagnosis not present

## 2020-01-23 DIAGNOSIS — Z87891 Personal history of nicotine dependence: Secondary | ICD-10-CM | POA: Insufficient documentation

## 2020-01-23 DIAGNOSIS — Y33XXXA Other specified events, undetermined intent, initial encounter: Secondary | ICD-10-CM | POA: Insufficient documentation

## 2020-01-23 DIAGNOSIS — Y929 Unspecified place or not applicable: Secondary | ICD-10-CM | POA: Diagnosis not present

## 2020-01-23 DIAGNOSIS — S99912A Unspecified injury of left ankle, initial encounter: Secondary | ICD-10-CM | POA: Diagnosis present

## 2020-01-23 MED ORDER — OXYCODONE-ACETAMINOPHEN 5-325 MG PO TABS
1.0000 | ORAL_TABLET | ORAL | Status: DC | PRN
  Administered 2020-01-23: 1 via ORAL
  Filled 2020-01-23: qty 1
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
## 2020-01-23 NOTE — ED Notes (Signed)
+  fracture  Let pt know to call Grandmother

## 2020-01-24 MED ORDER — OXYCODONE-ACETAMINOPHEN 5-325 MG PO TABS
1.0000 | ORAL_TABLET | Freq: Once | ORAL | Status: AC
  Administered 2020-01-24: 1 via ORAL
  Filled 2020-01-24: qty 1

## 2020-01-24 MED ORDER — IBUPROFEN 800 MG PO TABS
800.0000 mg | ORAL_TABLET | Freq: Once | ORAL | Status: AC
  Administered 2020-01-24: 800 mg via ORAL
  Filled 2020-01-24: qty 1

## 2020-01-24 MED ORDER — OXYCODONE-ACETAMINOPHEN 5-325 MG PO TABS: 1.0000 | ORAL_TABLET | ORAL | 0 refills | Status: DC | PRN

## 2020-01-24 MED ORDER — IBUPROFEN 800 MG PO TABS: 800.0000 mg | ORAL_TABLET | Freq: Three times a day (TID) | ORAL | 0 refills | Status: AC

## 2020-01-24 NOTE — Discharge Instructions (Signed)
Take the prescribed medication as directed.  Do not drive while taking pain medication, it can make you sleepy/drowsy. I would try to keep your leg elevated as much as you can to help keep swelling down. Follow-up with Dr. Dion Saucier-- call his office first thing Monday morning to schedule appt.  He is expecting you for follow-up. Return to the ED for new or worsening symptoms.

## 2020-01-24 NOTE — ED Provider Notes (Signed)
[facility] EMERGENCY DEPARTMENT Provider Note   CSN: [id number] Arrival date & time: 01/23/20  2154     History Chief Complaint  Patient presents with  . Ankle Injury    Lori Short is a 24 y.o. female.  The history is provided by the patient and medical records.  Ankle Injury    24 year old female presenting to the ED with left ankle pain and swelling.  States she was horse playing and someone fell onto her left ankle.  She immediately heard a "crack" and had onset of pain and swelling.  She has been unable to bear weight or ambulate on her left foot since this occurred.  She has pain to medial and lateral left ankle.  She denies any numbness or weakness of her left foot.  Denies any prior orthopedic injuries in the past.  She was given Percocet in triage with minimal change in pain.  Past Medical History:  Diagnosis Date  . Bronchitis   . Headache     Patient Active Problem List   Diagnosis Date Noted  . 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 Pyelectasis of fetus on prenatal ultrasound 05/01/2018  . Anemia affecting pregnancy in first trimester 03/15/2018  . Supervision of other normal pregnancy, antepartum 03/12/2018  . History of gestational hypertension 03/18/2017  . Proteinuria affecting pregnancy in third trimester 03/18/2017  . Edema during pregnancy in third trimester 03/18/2017  . Rubella non-immune status, antepartum 11/20/2016  . Substance abuse affecting pregnancy, antepartum 11/20/2016  . 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 Smoker 09/30/2014  . Maternal obesity, antepartum 09/30/2014    Past Surgical History:  Procedure Laterality Date  . NO PAST SURGERIES       OB History    Gravida  2   Para  1   Term  1   Preterm      AB      Living  1     SAB      TAB      Ectopic      Multiple  0   Live Births  1           Family History  Problem Relation Age of Onset  .  Hypertension Maternal Grandmother   . Heart disease Maternal Grandmother     Social History   Tobacco Use  . Smoking status: Former Smoker    Packs/day: 0.25    Years: 1.00    Pack years: 0.25    Types: Cigarettes    Quit date: 04/14/2015    Years since quitting: 4.7  . Smokeless tobacco: Former Engineer, water Use Topics  . Alcohol use: Never    Comment: occasional   . Drug use: No    Home Medications Prior to Admission medications   Medication Sig Start Date End Date Taking? Authorizing Provider  albuterol (PROVENTIL HFA;VENTOLIN HFA) 108 (90 Base) MCG/ACT inhaler Inhale 2 puffs into the lungs every 6 (six) hours as needed for wheezing or shortness of breath. 01/03/18   Menshew, [name], PA-C  aspirin EC 81 MG tablet Take 1 tablet (81 mg total) by mouth daily. 05/13/18   Reva Bores, MD  diphenhydrAMINE-APAP, sleep, (TYLENOL PM EXTRA STRENGTH PO) Take by mouth.    [provider]  fluconazole (DIFLUCAN) 150 MG tablet Take 1 tablet (150 mg total) by mouth daily. Repeat in 1 week if needed 05/07/19   Eustace Moore, MD  metroNIDAZOLE (FLAGYL)  500 MG tablet Take 1 tablet (500 mg total) by mouth 2 (two) times daily. 05/07/19   Eustace Moore, MD  nitrofurantoin, macrocrystal-monohydrate, (MACROBID) 100 MG capsule Take 1 capsule (100 mg total) by mouth 2 (two) times daily. 05/07/19   Eustace Moore, MD  ferrous fumarate (HEMOCYTE - 106 MG FE) 325 (106 Fe) MG TABS tablet Take 1 tablet (106 mg of iron total) by mouth 2 (two) times daily. Take with juice; avoid dairy 30 min before and after taking pill. Patient not taking: Reported on 05/27/2018 03/13/18 05/07/19  [name], CNM    Allergies    Shellfish allergy  Review of Systems   Review of Systems  Musculoskeletal: Positive for arthralgias.  All other systems reviewed and are negative.   Physical Exam Updated Vital Signs BP 105/66   Pulse 99   Temp 98.8 F (37.1 C) (Oral)   Resp 16    Ht 5\' 5"  (1.651 m)   Wt 120 kg   SpO2 100%   BMI 44.02 kg/m   Physical Exam Vitals and nursing note reviewed.  Constitutional:      Appearance: She is well-developed.  HENT:     Head: Normocephalic and atraumatic.  Eyes:     Conjunctiva/sclera: Conjunctivae normal.     Pupils: Pupils are equal, round, and reactive to light.  Cardiovascular:     Rate and Rhythm: Normal rate and regular rhythm.     Heart sounds: Normal heart sounds.  Pulmonary:     Effort: Pulmonary effort is normal. No respiratory distress.     Breath sounds: Normal breath sounds. No rhonchi.  Abdominal:     General: Bowel sounds are normal.     Palpations: Abdomen is soft.     Tenderness: There is no abdominal tenderness. There is no rebound.  Musculoskeletal:        General: Normal range of motion.     Cervical back: Normal range of motion.     Comments: Left ankle is diffusely swollen, there is some bruising surrounding the lateral malleolus and extending into the left lateral foot, compartments are soft and compressible, DP pulse intact, moving toes normally, normal distal sensation and perfusion  Skin:    General: Skin is warm and dry.  Neurological:     Mental Status: [name], place, and time.     ED Results / Procedures / Treatments   Labs (all labs ordered are listed, but only abnormal results are displayed) Labs Reviewed - No data to display  EKG None  Radiology DG Ankle Complete Left  Result Date: 01/23/2020 CLINICAL DATA:  Ankle injury EXAM: LEFT ANKLE COMPLETE - 3+ VIEW COMPARISON:  None. FINDINGS: Obliquely oriented trans syndesmotic fracture of the distal fibula and transversely oriented fracture of the medial malleolus with extension into the ankle mortise. Mild the ankle mortise appears congruent at this time without asymmetric widening of the medial or lateral clear space though the pattern of injury remains highly suspicious for ankle instability.  Circumferential soft tissue swelling is present with a large ankle joint effusion. No other acute fracture or traumatic malalignment. Midfoot and hindfoot alignment is grossly preserved though incompletely assessed on nondedicated, nonweightbearing films. IMPRESSION: 1. Bimalleolar fracture with extension into the ankle mortise. No abnormal widening of the medial or lateral clear space though fracture pattern (Weber B stage IV) is highly suspicious for ankle instability. 2. No other acute fracture or traumatic malalignment. Electronically Signed   By: 03/24/2020  [facility] M.D.   On: 01/23/2020 23:33    Procedures Procedures (including critical care time)  Medications Ordered in ED Medications  oxyCODONE-acetaminophen (PERCOCET/ROXICET) 5-325 MG per tablet 1 tablet (1 tablet Oral Given 01/23/20 2230)  oxyCODONE-acetaminophen (PERCOCET/ROXICET) 5-325 MG per tablet 1 tablet (1 tablet Oral Given 01/24/20 0335)  ibuprofen (ADVIL) tablet 800 mg (800 mg Oral Given 01/24/20 0335)    ED Course  I have reviewed the triage vital signs and the nursing notes.  Pertinent labs & imaging results that were available during my care of the patient were reviewed by me and considered in my medical decision making (see chart for details).    MDM Rules/Calculators/A&P  24 year old female presenting to the ED with left ankle injury.  States she was horse playing and someone fell on her left ankle.  Immediately felt a "crack" and since then has had worsening pain and swelling.  She has been unable to bear weight or ambulate on left leg since this occurred.  On exam she does have fairly significant swelling diffusely around the left ankle, bruising developing along the lateral malleolus.  There is pain with any attempted palpation or movement.  DP pulses intact, moving toes normally.  Her compartments are soft and easily compressible.  She has normal distal sensation and perfusion.  X-ray with bimalleolar fracture extending into the  ankle mortise.  There is no widening, however given nature of fracture there was concerned this may be unstable.  Will discuss with orthopedics.  3:38 AM Discussed with on call orthopedics, Dr. Mardelle Matte-- does not need CT for pre-op planning.  Ok for splint and follow-up in clinic.    Short leg splint was placed here, patient able to ambulate with crutches.  She is stable for discharge home.  She was given Percocet and Motrin for pain control.  She will call on Monday to schedule follow-up with Dr. Mardelle Matte in clinic.  She may return here for any new or acute changes.  Final Clinical Impression(s) / ED Diagnoses Final diagnoses:  Closed bimalleolar fracture of left ankle, initial encounter    Rx / DC Orders ED Discharge Orders         Ordered    oxyCODONE-acetaminophen (PERCOCET) 5-325 MG tablet  Every 4 hours PRN     01/24/20 0509    ibuprofen (ADVIL) 800 MG tablet  3 times daily     01/24/20 0509           Larene Pickett, PA-C 01/24/20 0530    Ward, Delice Bison, DO 01/24/20 [phone]

## 2020-01-24 NOTE — Progress Notes (Signed)
Orthopedic Tech Progress Note Patient Details:  ITALY WARRINER [birth year] [id number]  Ortho Devices Type of Ortho Device: Crutches, Post (short leg) splint Ortho Device/Splint Location: lle Ortho Device/Splint Interventions: Ordered, Application, Adjustment   Post Interventions Patient Tolerated: Well Instructions Provided: Care of device, Adjustment of device   Trinna Post 01/24/2020, 4:48 AM

## 2020-01-24 NOTE — ED Notes (Signed)
+  fracture   pts mother called would like pt to know she has a ride waiting and the kids are fine call when she can

## 2020-01-28 ENCOUNTER — Encounter (HOSPITAL_BASED_OUTPATIENT_CLINIC_OR_DEPARTMENT_OTHER): Payer: Self-pay | Admitting: Orthopedic Surgery

## 2020-01-28 ENCOUNTER — Other Ambulatory Visit: Payer: Self-pay

## 2020-01-29 ENCOUNTER — Other Ambulatory Visit (HOSPITAL_COMMUNITY)
Admission: RE | Admit: 2020-01-29 | Discharge: 2020-01-29 | Disposition: A | Discharge status: Home / Self Care | Payer: Medicaid Other | Source: Ambulatory Visit | Attending: Orthopedic Surgery | Admitting: Orthopedic Surgery

## 2020-01-29 DIAGNOSIS — Z20822 Contact with and (suspected) exposure to covid-19: Secondary | ICD-10-CM | POA: Insufficient documentation

## 2020-01-29 DIAGNOSIS — Z01812 Encounter for preprocedural laboratory examination: Secondary | ICD-10-CM | POA: Diagnosis present

## 2020-01-29 LAB — SARS CORONAVIRUS 2 (TAT 6-24 HRS): SARS Coronavirus 2: NEGATIVE

## 2020-02-02 ENCOUNTER — Ambulatory Visit (HOSPITAL_BASED_OUTPATIENT_CLINIC_OR_DEPARTMENT_OTHER)
Admission: RE | Admit: 2020-02-02 | Discharge: 2020-02-02 | Disposition: A | Discharge status: Home / Self Care | Payer: Medicaid Other | Attending: Orthopedic Surgery | Admitting: Orthopedic Surgery

## 2020-02-02 ENCOUNTER — Ambulatory Visit (HOSPITAL_BASED_OUTPATIENT_CLINIC_OR_DEPARTMENT_OTHER): Payer: Medicaid Other | Admitting: Anesthesiology

## 2020-02-02 ENCOUNTER — Encounter (HOSPITAL_BASED_OUTPATIENT_CLINIC_OR_DEPARTMENT_OTHER): Payer: Self-pay | Admitting: Orthopedic Surgery

## 2020-02-02 ENCOUNTER — Encounter (HOSPITAL_BASED_OUTPATIENT_CLINIC_OR_DEPARTMENT_OTHER)
Admission: RE | Disposition: A | Discharge status: Home / Self Care | Payer: Self-pay | Source: Home / Self Care | Attending: Orthopedic Surgery

## 2020-02-02 ENCOUNTER — Other Ambulatory Visit: Payer: Self-pay

## 2020-02-02 DIAGNOSIS — F17210 Nicotine dependence, cigarettes, uncomplicated: Secondary | ICD-10-CM | POA: Diagnosis not present

## 2020-02-02 DIAGNOSIS — Z8249 Family history of ischemic heart disease and other diseases of the circulatory system: Secondary | ICD-10-CM | POA: Insufficient documentation

## 2020-02-02 DIAGNOSIS — X58XXXA Exposure to other specified factors, initial encounter: Secondary | ICD-10-CM | POA: Diagnosis not present

## 2020-02-02 DIAGNOSIS — Z791 Long term (current) use of non-steroidal anti-inflammatories (NSAID): Secondary | ICD-10-CM | POA: Diagnosis not present

## 2020-02-02 DIAGNOSIS — Z7982 Long term (current) use of aspirin: Secondary | ICD-10-CM | POA: Diagnosis not present

## 2020-02-02 DIAGNOSIS — S82842A Displaced bimalleolar fracture of left lower leg, initial encounter for closed fracture: Secondary | ICD-10-CM | POA: Insufficient documentation

## 2020-02-02 DIAGNOSIS — Z79899 Other long term (current) drug therapy: Secondary | ICD-10-CM | POA: Diagnosis not present

## 2020-02-02 HISTORY — PX: ORIF ANKLE FRACTURE: SHX5408

## 2020-02-02 LAB — CBC
HCT: 36.8 % (ref 36.0–46.0)
Hemoglobin: 10.6 g/dL — ABNORMAL LOW (ref 12.0–15.0)
MCH: 20.6 pg — ABNORMAL LOW (ref 26.0–34.0)
MCHC: 28.8 g/dL — ABNORMAL LOW (ref 30.0–36.0)
MCV: 71.6 fL — ABNORMAL LOW (ref 80.0–100.0)
Platelets: 597 10*3/uL — ABNORMAL HIGH (ref 150–400)
RBC: 5.14 MIL/uL — ABNORMAL HIGH (ref 3.87–5.11)
RDW: 16.6 % — ABNORMAL HIGH (ref 11.5–15.5)
WBC: 7.7 10*3/uL (ref 4.0–10.5)
nRBC: 0 % (ref 0.0–0.2)

## 2020-02-02 LAB — POCT PREGNANCY, URINE: Preg Test, Ur: NEGATIVE

## 2020-02-02 SURGERY — OPEN REDUCTION INTERNAL FIXATION (ORIF) ANKLE FRACTURE
Anesthesia: General | Site: Ankle | Laterality: Left

## 2020-02-02 MED ORDER — FENTANYL CITRATE (PF) 100 MCG/2ML IJ SOLN
INTRAMUSCULAR | Status: AC
  Filled 2020-02-02: qty 2

## 2020-02-02 MED ORDER — ASPIRIN 81 MG PO CHEW
81.0000 mg | CHEWABLE_TABLET | Freq: Two times a day (BID) | ORAL | 0 refills | Status: AC
Start: 2020-02-02 — End: 2020-03-03

## 2020-02-02 MED ORDER — DEXAMETHASONE SODIUM PHOSPHATE 10 MG/ML IJ SOLN
INTRAMUSCULAR | Status: DC | PRN
  Administered 2020-02-02: 10 mg via INTRAVENOUS

## 2020-02-02 MED ORDER — ROPIVACAINE HCL 5 MG/ML IJ SOLN
INTRAMUSCULAR | Status: DC | PRN
Start: 2020-02-02 — End: 2020-02-02
  Administered 2020-02-02: 20 mL via PERINEURAL

## 2020-02-02 MED ORDER — OXYCODONE HCL 5 MG PO TABS: 5.0000 mg | ORAL_TABLET | ORAL | 0 refills | Status: AC | PRN

## 2020-02-02 MED ORDER — FENTANYL CITRATE (PF) 100 MCG/2ML IJ SOLN: 25.0000 ug | INTRAMUSCULAR | Status: DC | PRN

## 2020-02-02 MED ORDER — FENTANYL CITRATE (PF) 100 MCG/2ML IJ SOLN
50.0000 ug | INTRAMUSCULAR | Status: AC | PRN
  Administered 2020-02-02 (×4): 50 ug via INTRAVENOUS

## 2020-02-02 MED ORDER — ONDANSETRON HCL 4 MG/2ML IJ SOLN
INTRAMUSCULAR | Status: AC
  Filled 2020-02-02: qty 2

## 2020-02-02 MED ORDER — BUPIVACAINE-EPINEPHRINE (PF) 0.5% -1:200000 IJ SOLN
INTRAMUSCULAR | Status: DC | PRN
  Administered 2020-02-02: 30 mL via PERINEURAL

## 2020-02-02 MED ORDER — DIPHENHYDRAMINE HCL 50 MG/ML IJ SOLN
INTRAMUSCULAR | Status: AC
  Filled 2020-02-02: qty 1

## 2020-02-02 MED ORDER — PROPOFOL 500 MG/50ML IV EMUL
INTRAVENOUS | Status: AC
  Filled 2020-02-02: qty 50

## 2020-02-02 MED ORDER — PROPOFOL 10 MG/ML IV BOLUS
INTRAVENOUS | Status: DC | PRN
  Administered 2020-02-02: 250 mg via INTRAVENOUS

## 2020-02-02 MED ORDER — KETOROLAC TROMETHAMINE 30 MG/ML IJ SOLN: 30.0000 mg | Freq: Once | INTRAMUSCULAR | Status: DC | PRN

## 2020-02-02 MED ORDER — CLONIDINE HCL (ANALGESIA) 100 MCG/ML EP SOLN
EPIDURAL | Status: DC | PRN
  Administered 2020-02-02 (×2): 50 ug

## 2020-02-02 MED ORDER — LIDOCAINE 2% (20 MG/ML) 5 ML SYRINGE
INTRAMUSCULAR | Status: AC
  Filled 2020-02-02: qty 5

## 2020-02-02 MED ORDER — DEXAMETHASONE SODIUM PHOSPHATE 10 MG/ML IJ SOLN
INTRAMUSCULAR | Status: AC
  Filled 2020-02-02: qty 1

## 2020-02-02 MED ORDER — ONDANSETRON HCL 4 MG/2ML IJ SOLN: 4.0000 mg | Freq: Once | INTRAMUSCULAR | Status: DC | PRN

## 2020-02-02 MED ORDER — MIDAZOLAM HCL 2 MG/2ML IJ SOLN
1.0000 mg | INTRAMUSCULAR | Status: DC | PRN
  Administered 2020-02-02: 2 mg via INTRAVENOUS

## 2020-02-02 MED ORDER — PHENYLEPHRINE 40 MCG/ML (10ML) SYRINGE FOR IV PUSH (FOR BLOOD PRESSURE SUPPORT)
PREFILLED_SYRINGE | INTRAVENOUS | Status: AC
  Filled 2020-02-02: qty 10

## 2020-02-02 MED ORDER — LIDOCAINE 2% (20 MG/ML) 5 ML SYRINGE
INTRAMUSCULAR | Status: DC | PRN
  Administered 2020-02-02: 20 mg via INTRAVENOUS

## 2020-02-02 MED ORDER — BUPIVACAINE HCL (PF) 0.25 % IJ SOLN
INTRAMUSCULAR | Status: AC
  Filled 2020-02-02: qty 30

## 2020-02-02 MED ORDER — CEFAZOLIN SODIUM-DEXTROSE 2-4 GM/100ML-% IV SOLN
INTRAVENOUS | Status: AC
  Filled 2020-02-02: qty 100

## 2020-02-02 MED ORDER — LACTATED RINGERS IV SOLN: INTRAVENOUS | Status: DC

## 2020-02-02 MED ORDER — MIDAZOLAM HCL 2 MG/2ML IJ SOLN
INTRAMUSCULAR | Status: AC
  Filled 2020-02-02: qty 2

## 2020-02-02 MED ORDER — ACETAMINOPHEN 500 MG PO TABS
1000.0000 mg | ORAL_TABLET | Freq: Once | ORAL | Status: AC
  Administered 2020-02-02: 1000 mg via ORAL

## 2020-02-02 MED ORDER — OXYCODONE HCL 5 MG/5ML PO SOLN: 5.0000 mg | Freq: Once | ORAL | Status: DC | PRN

## 2020-02-02 MED ORDER — OXYCODONE HCL 5 MG PO TABS: 5.0000 mg | ORAL_TABLET | Freq: Once | ORAL | Status: DC | PRN

## 2020-02-02 MED ORDER — ACETAMINOPHEN 500 MG PO TABS
ORAL_TABLET | ORAL | Status: AC
  Filled 2020-02-02: qty 2

## 2020-02-02 MED ORDER — PHENYLEPHRINE HCL (PRESSORS) 10 MG/ML IV SOLN
INTRAVENOUS | Status: DC | PRN
Start: 2020-02-02 — End: 2020-02-02
  Administered 2020-02-02 (×2): 80 ug via INTRAVENOUS
  Administered 2020-02-02: 40 ug via INTRAVENOUS

## 2020-02-02 MED ORDER — MIDAZOLAM HCL 5 MG/5ML IJ SOLN
INTRAMUSCULAR | Status: DC | PRN
  Administered 2020-02-02: 2 mg via INTRAVENOUS

## 2020-02-02 MED ORDER — BUPIVACAINE HCL (PF) 0.5 % IJ SOLN
INTRAMUSCULAR | Status: AC
  Filled 2020-02-02: qty 30

## 2020-02-02 MED ORDER — SUCCINYLCHOLINE CHLORIDE 200 MG/10ML IV SOSY
PREFILLED_SYRINGE | INTRAVENOUS | Status: AC
  Filled 2020-02-02: qty 10

## 2020-02-02 MED ORDER — CEFAZOLIN SODIUM-DEXTROSE 2-4 GM/100ML-% IV SOLN
2.0000 g | INTRAVENOUS | Status: AC
  Administered 2020-02-02: 12:00:00 2 g via INTRAVENOUS

## 2020-02-02 MED ORDER — ONDANSETRON HCL 4 MG/2ML IJ SOLN
INTRAMUSCULAR | Status: DC | PRN
  Administered 2020-02-02: 4 mg via INTRAVENOUS

## 2020-02-02 MED ORDER — EPHEDRINE 5 MG/ML INJ
INTRAVENOUS | Status: AC
  Filled 2020-02-02: qty 10

## 2020-02-02 SURGICAL SUPPLY — 91 items
BANDAGE ESMARK 6X9 LF (GAUZE/BANDAGES/DRESSINGS) ×1 IMPLANT
BIT DRILL 110X2.5XQCK CNCT (BIT) ×1 IMPLANT
BIT DRILL 2.5 (BIT) ×3
BIT DRILL 2.7XCANN QCK CNCT (BIT) ×1 IMPLANT
BIT DRILL CANN 2.7 (BIT) ×2
BIT DRILL CANN 2.7MM (BIT) ×1
BIT DRILL QC 110 3.5 (BIT) ×1
BIT DRILL QC 110 3.5MM (BIT) ×1 IMPLANT
BIT DRL 110X2.5XQCK CNCT (BIT) ×1
BIT DRL 2.7XCANN QCK CNCT (BIT) ×1
BLADE SURG 15 STRL LF DISP TIS (BLADE) ×3 IMPLANT
BLADE SURG 15 STRL SS (BLADE) ×9
BNDG CMPR 9X6 STRL LF SNTH (GAUZE/BANDAGES/DRESSINGS) ×1
BNDG COHESIVE 4X5 TAN STRL (GAUZE/BANDAGES/DRESSINGS) ×3 IMPLANT
BNDG ELASTIC 4X5.8 VLCR STR LF (GAUZE/BANDAGES/DRESSINGS) ×3 IMPLANT
BNDG ELASTIC 6X5.8 VLCR STR LF (GAUZE/BANDAGES/DRESSINGS) ×3 IMPLANT
BNDG ESMARK 6X9 LF (GAUZE/BANDAGES/DRESSINGS) ×3
CANISTER SUCT 1200ML W/VALVE (MISCELLANEOUS) ×3 IMPLANT
CLOSURE STERI-STRIP 1/2X4 (GAUZE/BANDAGES/DRESSINGS)
CLSR STERI-STRIP ANTIMIC 1/2X4 (GAUZE/BANDAGES/DRESSINGS) IMPLANT
COVER BACK TABLE 60X90IN (DRAPES) ×3 IMPLANT
COVER WAND RF STERILE (DRAPES) IMPLANT
CUFF TOURN SGL QUICK 34 (TOURNIQUET CUFF) ×3
CUFF TRNQT CYL 34X4.125X (TOURNIQUET CUFF) ×1 IMPLANT
DECANTER SPIKE VIAL GLASS SM (MISCELLANEOUS) IMPLANT
DRAPE C-ARM 42X72 X-RAY (DRAPES) IMPLANT
DRAPE C-ARMOR (DRAPES) IMPLANT
DRAPE EXTREMITY T 121X128X90 (DISPOSABLE) ×3 IMPLANT
DRAPE IMP U-DRAPE 54X76 (DRAPES) ×3 IMPLANT
DRAPE INCISE IOBAN 66X45 STRL (DRAPES) IMPLANT
DRAPE OEC MINIVIEW 54X84 (DRAPES) ×3 IMPLANT
DRAPE U-SHAPE 47X51 STRL (DRAPES) ×3 IMPLANT
DRILL BIT QC 110 3.5MM (BIT) ×3
DRSG ADAPTIC 3X8 NADH LF (GAUZE/BANDAGES/DRESSINGS) IMPLANT
DRSG PAD ABDOMINAL 8X10 ST (GAUZE/BANDAGES/DRESSINGS) ×6 IMPLANT
DURAPREP 26ML APPLICATOR (WOUND CARE) ×3 IMPLANT
ELECT REM PT RETURN 9FT ADLT (ELECTROSURGICAL) ×3
ELECTRODE REM PT RTRN 9FT ADLT (ELECTROSURGICAL) ×1 IMPLANT
GAUZE SPONGE 4X4 12PLY STRL (GAUZE/BANDAGES/DRESSINGS) ×3 IMPLANT
GLOVE BIO SURGEON STRL SZ7 (GLOVE) ×6 IMPLANT
GLOVE BIOGEL PI IND STRL 7.0 (GLOVE) ×5 IMPLANT
GLOVE BIOGEL PI IND STRL 8 (GLOVE) ×1 IMPLANT
GLOVE BIOGEL PI INDICATOR 7.0 (GLOVE) ×10
GLOVE BIOGEL PI INDICATOR 8 (GLOVE) ×2
GLOVE ECLIPSE 6.5 STRL STRAW (GLOVE) ×6 IMPLANT
GLOVE ORTHO TXT STRL SZ7.5 (GLOVE) ×3 IMPLANT
GOWN STRL REUS W/ TWL LRG LVL3 (GOWN DISPOSABLE) ×1 IMPLANT
GOWN STRL REUS W/ TWL XL LVL3 (GOWN DISPOSABLE) ×2 IMPLANT
GOWN STRL REUS W/TWL LRG LVL3 (GOWN DISPOSABLE) ×3
GOWN STRL REUS W/TWL XL LVL3 (GOWN DISPOSABLE) ×6
GUIDEWIRE PIN ORTH 6X1.6XSMTH (WIRE) ×1 IMPLANT
K-WIRE 1.6 (WIRE) ×3
NEEDLE HYPO 25X1 1.5 SAFETY (NEEDLE) IMPLANT
NS IRRIG 1000ML POUR BTL (IV SOLUTION) ×3 IMPLANT
PAD CAST 4YDX4 CTTN HI CHSV (CAST SUPPLIES) ×2 IMPLANT
PADDING CAST COTTON 4X4 STRL (CAST SUPPLIES) ×6
PENCIL SMOKE EVACUATOR (MISCELLANEOUS) ×3 IMPLANT
PLATE 6HOLE 1/3 TUBULAR (Plate) ×3 IMPLANT
SCREW CANC 2.5XFT 16X4XST SM (Screw) ×2 IMPLANT
SCREW CANC 4.0X16 (Screw) ×6 IMPLANT
SCREW CANN 1/3 THRD RVRS CT (Screw) ×1 IMPLANT
SCREW CANNULATED 4.0X40 (Screw) ×3 IMPLANT
SCREW CORT S/T 3.5X22 (Screw) ×3 IMPLANT
SCREW CORTICAL 3.5X14 (Screw) ×9 IMPLANT
SET BASIN DAY SURGERY F.S. (CUSTOM PROCEDURE TRAY) ×3 IMPLANT
SHEET MEDIUM DRAPE 40X70 STRL (DRAPES) ×3 IMPLANT
SLEEVE SCD COMPRESS KNEE MED (MISCELLANEOUS) ×3 IMPLANT
SPLINT FAST PLASTER 5X30 (CAST SUPPLIES) ×40
SPLINT PLASTER CAST FAST 5X30 (CAST SUPPLIES) ×20 IMPLANT
SPONGE LAP 4X18 RFD (DISPOSABLE) ×3 IMPLANT
STAPLER VISISTAT 35W (STAPLE) IMPLANT
SUCTION FRAZIER HANDLE 10FR (MISCELLANEOUS) ×3
SUCTION TUBE FRAZIER 10FR DISP (MISCELLANEOUS) ×1 IMPLANT
SUT ETHILON 3 0 PS 1 (SUTURE) IMPLANT
SUT ETHILON 4 0 PS 2 18 (SUTURE) IMPLANT
SUT MNCRL AB 4-0 PS2 18 (SUTURE) IMPLANT
SUT VIC AB 0 CT1 27 (SUTURE)
SUT VIC AB 0 CT1 27XBRD ANBCTR (SUTURE) IMPLANT
SUT VIC AB 0 SH 27 (SUTURE) ×6 IMPLANT
SUT VIC AB 2-0 SH 18 (SUTURE) IMPLANT
SUT VIC AB 3-0 SH 27 (SUTURE) ×6
SUT VIC AB 3-0 SH 27X BRD (SUTURE) ×2 IMPLANT
SUT VICRYL 3-0 CR8 SH (SUTURE) IMPLANT
SYR BULB EAR ULCER 3OZ GRN STR (SYRINGE) ×3 IMPLANT
SYR CONTROL 10ML LL (SYRINGE) IMPLANT
TOWEL GREEN STERILE FF (TOWEL DISPOSABLE) ×3 IMPLANT
TUBE CONNECTING 20'X1/4 (TUBING) ×1
TUBE CONNECTING 20X1/4 (TUBING) ×2 IMPLANT
UNDERPAD 30X36 HEAVY ABSORB (UNDERPADS AND DIAPERS) ×3 IMPLANT
WASHER SM (Washer) ×3 IMPLANT
YANKAUER SUCT BULB TIP NO VENT (SUCTIONS) ×3 IMPLANT

## 2020-02-02 NOTE — Anesthesia Postprocedure Evaluation (Signed)
Anesthesia Post Note  Patient: Lori Short  Procedure(s) Performed: OPEN REDUCTION INTERNAL FIXATION (ORIF) ANKLE FRACTURE (Left Ankle)     Patient location during evaluation: PACU Anesthesia Type: General Level of consciousness: awake and alert Pain management: pain level controlled Vital Signs Assessment: post-procedure vital signs reviewed and stable Respiratory status: spontaneous breathing, nonlabored ventilation and respiratory function stable Cardiovascular status: blood pressure returned to baseline and stable Postop Assessment: no apparent nausea or vomiting Anesthetic complications: no    Last Vitals:  Vitals:   02/02/20 1445 02/02/20 1454  BP: 122/74 125/73  Pulse: 81 83  Resp: 11 (!) 9  Temp:    SpO2: 100% 100%    Last Pain:  Vitals:   02/02/20 1511  TempSrc:   PainSc: 0-No pain                 Lucretia Kern

## 2020-02-02 NOTE — Progress Notes (Signed)
Assisted Dr. Rob Fitzgerald with left, ultrasound guided, popliteal/saphenous block. Side rails up, monitors on throughout procedure. See vital signs in flow sheet. Tolerated Procedure well. 

## 2020-02-02 NOTE — Anesthesia Preprocedure Evaluation (Signed)
Anesthesia Evaluation  Patient identified by MRN, date of birth, ID band Patient awake    Reviewed: Allergy & Precautions, NPO status , Patient's Chart, lab work & pertinent test results  Airway Mallampati: III  TM Distance: >3 FB Neck ROM: Full    Dental  (+) Dental Advisory Given   Pulmonary Current Smoker and Patient abstained from smoking.,    breath sounds clear to auscultation       Cardiovascular negative cardio ROS   Rhythm:Regular Rate:Normal     Neuro/Psych  Headaches,    GI/Hepatic negative GI ROS, Neg liver ROS,   Endo/Other  Morbid obesity  Renal/GU negative Renal ROS     Musculoskeletal   Abdominal   Peds  Hematology negative hematology ROS (+)   Anesthesia Other Findings   Reproductive/Obstetrics                             Anesthesia Physical Anesthesia Plan  ASA: III  Anesthesia Plan: General   Post-op Pain Management:  Regional for Post-op pain   Induction: Intravenous  PONV Risk Score and Plan: 2 and Dexamethasone, Ondansetron and Treatment may vary due to age or medical condition  Airway Management Planned: LMA  Additional Equipment: None  Intra-op Plan:   Post-operative Plan: Extubation in OR  Informed Consent: I have reviewed the patients History and Physical, chart, labs and discussed the procedure including the risks, benefits and alternatives for the proposed anesthesia with the patient or authorized representative who has indicated his/her understanding and acceptance.     Dental advisory given  Plan Discussed with: CRNA  Anesthesia Plan Comments:         Anesthesia Quick Evaluation

## 2020-02-02 NOTE — Op Note (Addendum)
02/02/2020  PATIENT:  Lori Short    PRE-OPERATIVE DIAGNOSIS:  DISPLACED BIMALLEOLAR FRACTURE OF LEFT ANKLE  POST-OPERATIVE DIAGNOSIS:  Same  PROCEDURE:  OPEN REDUCTION INTERNAL FIXATION (ORIF) ANKLE BIMALLEOLAR ANKLE FRACTURE  3 views of the left ankle were taken at the completion of the procedure, including an additional stress view, which demonstrated anatomic alignment status post ORIF with intact syndesmotic stability.  SURGEON:  Eulas Post, MD  PHYSICIAN ASSISTANT: Alfonse Alpers, PA-C, present and scrubbed throughout the case, critical for completion in a timely fashion, and for retraction, instrumentation, and closure.  ANESTHESIA:   General with regional block  ESTIMATED BLOOD LOSS: 50ml  PREOPERATIVE INDICATIONS:  Lori Short is a  24 y.o. female with a diagnosis of DISPLACED BIMALLEOLAR FRACTURE OF LEFT LOWER LEG  who elected for surgical management to minimize the risk for malunion and nonunion and post-traumatic arthritis.    The risks benefits and alternatives were discussed with the patient preoperatively including but not limited to the risks of infection, bleeding, nerve injury, cardiopulmonary complications, the need for revision surgery, the need for hardware removal, among others, and the patient was willing to proceed.  OPERATIVE IMPLANTS: 1/3 tubular plate, with a single interfragmentary lag screw, and one 4.0 mm cannulated screw for the medial malleolus.  OPERATIVE PROCEDURE: The patient was brought to the operating room and placed in the supine position. All bony prominences were padded. General anesthesia was administered. The lower extremity was prepped and draped in the usual sterile fashion. The leg was elevated and exsanguinated and the tourniquet was inflated. Time out was performed.   Incision was made over the distal fibula and the fracture was exposed and reduced anatomically with a clamp. The reduction was fairly challenging, taking multiple  attempts to achieve full length, ultimately I was able to talk the proximal piece in underneath the distal piece anteriorly in order to achieve anatomic length.   A lag screw was placed. I then applied a 1/3 tubular plate and secured it proximally and distally with non-locking screws. Bone quality was reasonably good. I used c-arm to confirm satisfactory reduction and fixation.   I then turned my attention to the medial malleolus. Incision was made over the medial malleolus and the fracture exposed and held provisionally with a clamp. There was significant comminution at the fracture site making it extremely difficult to orient anatomic alignment, but ultimately I achieved the best I could, and held this with a clamp, confirmed it with live fluoroscopy, and then placed a cannulated guidewire in the appropriate position.  A single 40 mm cannulated screw with a washer was placed, and had satisfactory reduction and fixation.  The syndesmosis was stressed using live fluoroscopy and found to be stable.   The wounds were irrigated, and closed with vicryl with routine closure for the skin. The wounds were injected with local anesthetic. Sterile gauze was applied followed by a posterior splint. She was awakened and returned to the PACU in stable and satisfactory condition. There were no complications.

## 2020-02-02 NOTE — Anesthesia Procedure Notes (Signed)
Anesthesia Regional Block: Adductor canal block   Pre-Anesthetic Checklist: ,, timeout performed, Correct Patient, Correct Site, Correct Laterality, Correct Procedure, Correct Position, site marked, Risks and benefits discussed,  Surgical consent,  Pre-op evaluation,  At surgeon's request and post-op pain management  Laterality: Left  Prep: chloraprep       Needles:  Injection technique: Single-shot  Needle Type: Echogenic Needle     Needle Length: 9cm  Needle Gauge: 21     Additional Needles:   Procedures:,,,, ultrasound used (permanent image in chart),,,,  Narrative:  Start time: 02/02/2020 11:08 AM End time: 02/02/2020 11:14 AM Injection made incrementally with aspirations every 5 mL.  Performed by: Personally  Anesthesiologist: Marcene Duos, MD

## 2020-02-02 NOTE — Discharge Instructions (Signed)

## 2020-02-02 NOTE — Anesthesia Procedure Notes (Signed)
Anesthesia Regional Block: Popliteal block   Pre-Anesthetic Checklist: ,, timeout performed, Correct Patient, Correct Site, Correct Laterality, Correct Procedure, Correct Position, site marked, Risks and benefits discussed,  Surgical consent,  Pre-op evaluation,  At surgeon's request and post-op pain management  Laterality: Left  Prep: chloraprep       Needles:  Injection technique: Single-shot  Needle Type: Echogenic Stimulator Needle     Needle Length: 9cm  Needle Gauge: 21     Additional Needles:   Procedures:, nerve stimulator,,, ultrasound used (permanent image in chart),,,,   Nerve Stimulator or Paresthesia:  Response: dorsiflextion, 0.5 mA,   Additional Responses:   Narrative:  Start time: 02/02/2020 11:00 AM End time: 02/02/2020 11:08 AM Injection made incrementally with aspirations every 5 mL.  Performed by: Personally  Anesthesiologist: Marcene Duos, MD

## 2020-02-02 NOTE — Anesthesia Procedure Notes (Signed)
Procedure Name: LMA Insertion Date/Time: 02/02/2020 12:19 PM Performed by: Ronnette Hila, CRNA Pre-anesthesia Checklist: Patient identified, Emergency Drugs available, Suction available and Patient being monitored Patient Re-evaluated:Patient Re-evaluated prior to induction Oxygen Delivery Method: Circle system utilized Preoxygenation: Pre-oxygenation with 100% oxygen Induction Type: IV induction Ventilation: Mask ventilation without difficulty LMA: LMA inserted LMA Size: 4.0 Number of attempts: 1 Airway Equipment and Method: Bite block Placement Confirmation: positive ETCO2 Tube secured with: Tape Dental Injury: Teeth and Oropharynx as per pre-operative assessment

## 2020-02-02 NOTE — Transfer of Care (Signed)
Immediate Anesthesia Transfer of Care Note  Patient: Lori Short  Procedure(s) Performed: OPEN REDUCTION INTERNAL FIXATION (ORIF) ANKLE FRACTURE (Left Ankle)  Patient Location: PACU  Anesthesia Type:General and GA combined with regional for post-op pain  Level of Consciousness: awake, alert  and drowsy  Airway & Oxygen Therapy: Patient Spontanous Breathing and Patient connected to nasal cannula oxygen  Post-op Assessment: Report given to RN and Post -op Vital signs reviewed and stable  Post vital signs: Reviewed and stable  Last Vitals:  Vitals Value Taken Time  BP 124/73 02/02/20 1422  Temp    Pulse 97 02/02/20 1425  Resp 19 02/02/20 1425  SpO2 100 % 02/02/20 1425  Vitals shown include unvalidated device data.  Last Pain:  Vitals:   02/02/20 1004  TempSrc: Oral  PainSc: 4       Patients Stated Pain Goal: 3 (02/02/20 1004)  Complications: No apparent anesthesia complications

## 2020-02-02 NOTE — H&P (Signed)
PREOPERATIVE H&P  Chief Complaint: Left ankle pain  HPI: Lori Short is a 24 y.o. female who presents for preoperative history and physical with a diagnosis of left displaced ankle fracture. Symptoms are rated as moderate to severe, and have been worsening.  This is significantly impairing activities of daily living.  She has elected for surgical management.  Injury occurred Jan 23, 2020, after she was playing around at home and someone fell onto her left ankle.  She had acute onset severe pain, unable to bear weight.  Oxycodone and ibuprofen has been minimally effective at controlling her pain.  Past Medical History:  Diagnosis Date  . Bronchitis   . Headache    Past Surgical History:  Procedure Laterality Date  . CESAREAN SECTION     Social History   Socioeconomic History  . Marital status: Single    Spouse name: Not on file  . Number of children: Not on file  . Years of education: Not on file  . Highest education level: Not on file  Occupational History  . Occupation: unemployed  Tobacco Use  . Smoking status: Current Some Day Smoker    Packs/day: 0.25    Years: 0.00    Pack years: 0.00    Types: Cigarettes  . Smokeless tobacco: Former Engineer, water and Sexual Activity  . Alcohol use: Yes    Comment: occasional   . Drug use: No  . Sexual activity: Yes    Partners: Male    Birth control/protection: None  Other Topics Concern  . Not on file  Social History Narrative  . Not on file   Social Determinants of Health   Financial Resource Strain:   . Difficulty of Paying Living Expenses:   Food Insecurity:   . Worried About Programme researcher, broadcasting/film/video in the Last Year:   . Barista in the Last Year:   Transportation Needs:   . Freight forwarder (Medical):   Marland Kitchen Lack of Transportation (Non-Medical):   Physical Activity:   . Days of Exercise per Week:   . Minutes of Exercise per Session:   Stress:   . Feeling of Stress :   Social Connections:   . Frequency  of Communication with Friends and Family:   . Frequency of Social Gatherings with Friends and Family:   . Attends Religious Services:   . Active Member of Clubs or Organizations:   . Attends Banker Meetings:   Marland Kitchen Marital Status:    Family History  Problem Relation Age of Onset  . Hypertension Maternal Grandmother   . Heart disease Maternal Grandmother    Allergies  Allergen Reactions  . Shellfish Allergy Hives, Nausea And Vomiting and Swelling    Reaction:  Tongue swelling   Prior to Admission medications   Medication Sig Start Date End Date Taking? Authorizing Provider  acetaminophen (TYLENOL) 500 MG tablet Take 500 mg by mouth every 6 (six) hours as needed.   Yes [provider]  APPLE CIDER VINEGAR PO Take by mouth.   Yes [provider]  diphenhydrAMINE-APAP, sleep, (TYLENOL PM EXTRA STRENGTH PO) Take by mouth.   Yes [provider]  ibuprofen (ADVIL) 800 MG tablet Take 1 tablet (800 mg total) by mouth 3 (three) times daily. 01/24/20  Yes [name], PA-C  oxyCODONE-acetaminophen (PERCOCET) 5-325 MG tablet Take 1 tablet by mouth every 4 (four) hours as needed. 01/24/20  Yes [name], PA-C  albuterol (PROVENTIL HFA;VENTOLIN HFA) 108 (  90 Base) MCG/ACT inhaler Inhale 2 puffs into the lungs every 6 (six) hours as needed for wheezing or shortness of breath. 01/03/18   Menshew, Dannielle Karvonen, PA-C  aspirin EC 81 MG tablet Take 1 tablet (81 mg total) by mouth daily. 05/13/18   Donnamae Jude, MD  ferrous fumarate (HEMOCYTE - 106 MG FE) 325 (106 Fe) MG TABS tablet Take 1 tablet (106 mg of iron total) by mouth 2 (two) times daily. Take with juice; avoid dairy 30 min before and after taking pill. Patient not taking: Reported on 05/27/2018 03/13/18 05/07/19  Starr Lake, CNM     Positive ROS: All other systems have been reviewed and were otherwise negative with the exception of those mentioned in the HPI and as above.  Physical Exam:  Body mass index is 40.94 kg/m.  General: Alert, no acute distress Cardiovascular: No pedal edema Respiratory: No cyanosis, no use of accessory musculature GI: No organomegaly, abdomen is soft and non-tender Skin: No lesions in the area of chief complaint Neurologic: Sensation intact distally Psychiatric: Patient is competent for consent with normal mood and affect Lymphatic: No axillary or cervical lymphadenopathy  MUSCULOSKELETAL: Left ankle has positive pain to palpation medially and laterally with swelling.  Sensation intact distally.  No fracture blisters.  Assessment: Left ankle intra-articular fracture, bimalleolar   Plan: Plan for Procedure(s): OPEN REDUCTION INTERNAL FIXATION (ORIF) ANKLE FRACTURE  The risks benefits and alternatives were discussed with the patient including but not limited to the risks of nonoperative treatment, versus surgical intervention including infection, bleeding, nerve injury, malunion, nonunion, the need for revision surgery, hardware prominence, hardware failure, the need for hardware removal, blood clots, cardiopulmonary complications, morbidity, mortality, among others, and they were willing to proceed.      Johnny Bridge, MD Cell [phone]   02/02/2020 11:46 AM

## 2020-02-03 ENCOUNTER — Encounter: Payer: Self-pay | Admitting: *Deleted

## 2020-05-06 ENCOUNTER — Ambulatory Visit (HOSPITAL_COMMUNITY)
Admission: EM | Admit: 2020-05-06 | Discharge: 2020-05-06 | Disposition: A | Discharge status: Home / Self Care | Payer: Medicaid Other

## 2020-05-06 ENCOUNTER — Other Ambulatory Visit: Payer: Self-pay

## 2020-05-06 NOTE — ED Notes (Signed)
Pt called x4. No answer 

## 2020-05-07 ENCOUNTER — Other Ambulatory Visit: Payer: Self-pay

## 2020-05-07 ENCOUNTER — Ambulatory Visit (HOSPITAL_COMMUNITY)
Admission: EM | Admit: 2020-05-07 | Discharge: 2020-05-07 | Disposition: A | Discharge status: Home / Self Care | Payer: Medicaid Other | Attending: Physician Assistant | Admitting: Physician Assistant

## 2020-05-07 ENCOUNTER — Encounter (HOSPITAL_COMMUNITY): Payer: Self-pay

## 2020-05-07 DIAGNOSIS — N390 Urinary tract infection, site not specified: Secondary | ICD-10-CM | POA: Diagnosis not present

## 2020-05-07 DIAGNOSIS — Z3202 Encounter for pregnancy test, result negative: Secondary | ICD-10-CM | POA: Diagnosis not present

## 2020-05-07 DIAGNOSIS — Z202 Contact with and (suspected) exposure to infections with a predominantly sexual mode of transmission: Secondary | ICD-10-CM

## 2020-05-07 DIAGNOSIS — N3001 Acute cystitis with hematuria: Secondary | ICD-10-CM | POA: Diagnosis not present

## 2020-05-07 LAB — POC URINE PREG, ED: Preg Test, Ur: NEGATIVE

## 2020-05-07 LAB — POCT URINALYSIS DIPSTICK, ED / UC
Bilirubin Urine: NEGATIVE
Glucose, UA: NEGATIVE mg/dL
Ketones, ur: NEGATIVE mg/dL
Nitrite: POSITIVE — AB
Protein, ur: 100 mg/dL — AB
Specific Gravity, Urine: >=1.03 (ref 1.005–1.030)
Urobilinogen, UA: 0.2 mg/dL (ref 0.0–1.0)
pH: 6 (ref 5.0–8.0)

## 2020-05-07 LAB — HIV ANTIBODY (ROUTINE TESTING W REFLEX): HIV Screen 4th Generation wRfx: NONREACTIVE

## 2020-05-07 MED ORDER — LIDOCAINE HCL (PF) 1 % IJ SOLN
INTRAMUSCULAR | Status: AC
  Filled 2020-05-07: qty 2

## 2020-05-07 MED ORDER — NITROFURANTOIN MONOHYD MACRO 100 MG PO CAPS
100.0000 mg | ORAL_CAPSULE | Freq: Two times a day (BID) | ORAL | 0 refills | Status: AC
Start: 2020-05-07 — End: 2020-05-12

## 2020-05-07 MED ORDER — CEFTRIAXONE SODIUM 500 MG IJ SOLR
INTRAMUSCULAR | Status: AC
  Filled 2020-05-07: qty 500

## 2020-05-07 MED ORDER — CEFTRIAXONE SODIUM 500 MG IJ SOLR
500.0000 mg | Freq: Once | INTRAMUSCULAR | Status: AC
  Administered 2020-05-07: 500 mg via INTRAMUSCULAR

## 2020-05-07 NOTE — Discharge Instructions (Addendum)
Take the medication as prescribed  We have sent your tests and will call you if we need to change any treatments  Abstain from sexual intercourse until you have completed all treatments and received all test results.  You should establish with a primary care provider

## 2020-05-07 NOTE — ED Provider Notes (Signed)
[facility]    CSN: [id number] Arrival date & time: 05/07/20  1005      History   Chief Complaint Chief Complaint  Patient presents with  . Urinary Tract Infection  . STI tx    HPI Lori Short is a 24 y.o. female.   Patient presents for concern of UTI and foul vaginal odor.  She also reports she had a positive exposure to gonorrhea.  She reports her last for 5 days she has had painful urination, frequency and urgency.  She is also noticed some blood in her urine.  She denies vaginal discharge or vaginal pain.  Denies pelvic pain.  She does endorse that she is currently on her menstrual period.  Denies fever, chills.  Denies nausea and vomiting.  Denies abdominal pain or flank pain.     Past Medical History:  Diagnosis Date  . Bronchitis   . Headache     Patient Active Problem List   Diagnosis Date Noted  . 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 Pyelectasis of fetus on prenatal ultrasound 05/01/2018  . Anemia affecting pregnancy in first trimester 03/15/2018  . Supervision of other normal pregnancy, antepartum 03/12/2018  . History of gestational hypertension 03/18/2017  . Proteinuria affecting pregnancy in third trimester 03/18/2017  . Edema during pregnancy in third trimester 03/18/2017  . Rubella non-immune status, antepartum 11/20/2016  . Substance abuse affecting pregnancy, antepartum 11/20/2016  . 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 Smoker 09/30/2014  . Maternal obesity, antepartum 09/30/2014    Past Surgical History:  Procedure Laterality Date  . CESAREAN SECTION    . ORIF ANKLE FRACTURE Left 02/02/2020   Procedure: OPEN REDUCTION INTERNAL FIXATION (ORIF) ANKLE FRACTURE;  Surgeon: Teryl Lucy, MD;  Location: [facility];  Service: Orthopedics;  Laterality: Left;    OB History    Gravida  2   Para  1   Term  1   Preterm      AB      Living  1     SAB      TAB      Ectopic       Multiple  0   Live Births  1            Home Medications    Prior to Admission medications   Medication Sig Start Date End Date Taking? Authorizing Provider  levonorgestrel (MIRENA) 20 MCG/24HR IUD by Intrauterine route. 10/03/18  Yes [provider]  acetaminophen (TYLENOL) 500 MG tablet Take 500 mg by mouth every 6 (six) hours as needed.    [provider]  albuterol (PROVENTIL HFA;VENTOLIN HFA) 108 (90 Base) MCG/ACT inhaler Inhale 2 puffs into the lungs every 6 (six) hours as needed for wheezing or shortness of breath. 01/03/18   Menshew, [name], PA-C  APPLE CIDER VINEGAR PO Take by mouth.    [provider]  diphenhydrAMINE-APAP, sleep, (TYLENOL PM EXTRA STRENGTH PO) Take by mouth.    [provider]  ibuprofen (ADVIL) 800 MG tablet Take 1 tablet (800 mg total) by mouth 3 (three) times daily. 01/24/20   [name], PA-C  nitrofurantoin, macrocrystal-monohydrate, (MACROBID) 100 MG capsule Take 1 capsule (100 mg total) by mouth 2 (two) times daily for 5 days. 05/07/20 05/12/20  Alitzel Cookson, Veryl Speak, PA-C  traMADol (ULTRAM) 50 MG tablet Take 50 mg by mouth 3 (three) times daily as needed. 03/18/20   [provider]  ferrous  fumarate (HEMOCYTE - 106 MG FE) 325 (106 Fe) MG TABS tablet Take 1 tablet (106 mg of iron total) by mouth 2 (two) times daily. Take with juice; avoid dairy 30 min before and after taking pill. Patient not taking: Reported on 05/27/2018 03/13/18 05/07/19  [name], CNM    Family History Family History  Problem Relation Age of Onset  . Hypertension Maternal Grandmother   . Heart disease Maternal Grandmother     Social History Social History   Tobacco Use  . Smoking status: Current Some Day Smoker    Packs/day: 0.25    Years: 0.00    Pack years: 0.00    Types: Cigarettes  . Smokeless tobacco: Former Clinical biochemist  . Vaping Use: Never used  Substance Use Topics  . Alcohol use: Yes     Comment: occasional   . Drug use: No     Allergies   Shellfish allergy   Review of Systems Review of Systems   Physical Exam Triage Vital Signs ED Triage Vitals  Enc Vitals Group     BP 05/07/20 1023 119/78     Pulse Rate 05/07/20 1023 99     Resp 05/07/20 1023 18     Temp 05/07/20 1023 99.1 F (37.3 C)     Temp Source 05/07/20 1023 Oral     SpO2 05/07/20 1023 96 %     Weight 05/07/20 1024 251 lb (113.9 kg)     Height 05/07/20 1024 5\' 5"  (1.651 m)     Head Circumference --      Peak Flow --      Pain Score 05/07/20 1023 1     Pain Loc --      Pain Edu? --      Excl. in GC? --    No data found.  Updated Vital Signs BP 119/78   Pulse 99   Temp 99.1 F (37.3 C) (Oral)   Resp 18   Ht 5\' 5"  (1.651 m)   Wt 251 lb (113.9 kg)   SpO2 96%   BMI 41.77 kg/m   Visual Acuity Right Eye Distance:   Left Eye Distance:   Bilateral Distance:    Right Eye Near:   Left Eye Near:    Bilateral Near:     Physical Exam Vitals and nursing note reviewed.  Constitutional:      Appearance: Normal appearance.  Cardiovascular:     Rate and Rhythm: Normal rate.  Pulmonary:     Effort: Pulmonary effort is normal. No respiratory distress.  Abdominal:     General: Abdomen is flat.     Tenderness: There is no abdominal tenderness. There is no right CVA tenderness or left CVA tenderness.  Neurological:     General: No focal deficit present.     Mental Status: [name], place, and time.      UC Treatments / Results  Labs (all labs ordered are listed, but only abnormal results are displayed) Labs Reviewed  POCT URINALYSIS DIPSTICK, ED / UC - Abnormal; Notable for the following components:      Result Value   Hgb urine dipstick MODERATE (*)    Protein, ur 100 (*)    Nitrite POSITIVE (*)    Leukocytes,Ua SMALL (*)    All other components within normal limits  URINE CULTURE  HIV ANTIBODY (ROUTINE TESTING W REFLEX)  RPR  POC URINE PREG, ED    CERVICOVAGINAL ANCILLARY ONLY  EKG   Radiology No results found.  Procedures Procedures (including critical care time)  Medications Ordered in UC Medications  cefTRIAXone (ROCEPHIN) injection 500 mg (500 mg Intramuscular Given 05/07/20 1110)    Initial Impression / Assessment and Plan / UC Course  I have reviewed the triage vital signs and the nursing notes.  Pertinent labs & imaging results that were available during my care of the patient were reviewed by me and considered in my medical decision making (see chart for details).     #Acute cystitis #Exposure to gonorrhea Patient is 24 year old presenting with a acute cystitis and exposure to gonorrhea.  UA with leukocytes and nitrites.  Will start on nitrofurantoin.  We will treat with Rocephin here in clinic and send vaginal swab.  HIV and RPR sent.  Urine culture sent.  Discussed return and follow-up precautions.  Patient verbalized understanding plan of care. Final Clinical Impressions(s) / UC Diagnoses   Final diagnoses:  Acute cystitis with hematuria  Exposure to gonorrhea     Discharge Instructions     Take the medication as prescribed  We have sent your tests and will call you if we need to change any treatments  Abstain from sexual intercourse until you have completed all treatments and received all test results.  You should establish with a primary care provider      ED Prescriptions    Medication Sig Dispense Auth. Provider   nitrofurantoin, macrocrystal-monohydrate, (MACROBID) 100 MG capsule Take 1 capsule (100 mg total) by mouth 2 (two) times daily for 5 days. 10 capsule Yaritza Leist, Veryl Speak, PA-C     PDMP not reviewed this encounter.   Hermelinda Medicus, PA-C 05/07/20 1113

## 2020-05-07 NOTE — ED Triage Notes (Signed)
Pt c/o urine dribble, dysuria, hematuriax4 days. Pt has foul vaginal odor. Pt states boyfriend tested + for gonorrhea.

## 2020-05-08 LAB — RPR: RPR Ser Ql: NONREACTIVE

## 2020-05-09 LAB — CERVICOVAGINAL ANCILLARY ONLY
Bacterial Vaginitis (gardnerella): POSITIVE — AB
Candida Glabrata: NEGATIVE
Candida Vaginitis: POSITIVE — AB
Chlamydia: NEGATIVE
Comment: NEGATIVE
Comment: NEGATIVE
Comment: NEGATIVE
Comment: NEGATIVE
Comment: NEGATIVE
Comment: NORMAL
Neisseria Gonorrhea: NEGATIVE
Trichomonas: NEGATIVE

## 2020-05-09 LAB — URINE CULTURE: Culture: >=100000 — AB

## 2020-05-10 ENCOUNTER — Telehealth (HOSPITAL_COMMUNITY): Payer: Self-pay | Admitting: Emergency Medicine

## 2020-05-10 MED ORDER — FLUCONAZOLE 150 MG PO TABS
150.0000 mg | ORAL_TABLET | Freq: Once | ORAL | 0 refills | Status: AC
Start: 2020-05-10 — End: 2020-05-10

## 2020-05-10 MED ORDER — METRONIDAZOLE 500 MG PO TABS
500.0000 mg | ORAL_TABLET | Freq: Two times a day (BID) | ORAL | 0 refills | Status: DC
Start: 2020-05-10 — End: 2021-03-29

## 2021-03-29 ENCOUNTER — Ambulatory Visit (HOSPITAL_COMMUNITY)
Admission: EM | Admit: 2021-03-29 | Discharge: 2021-03-29 | Disposition: A | Discharge status: Home / Self Care | Payer: Medicaid Other | Attending: Internal Medicine | Admitting: Internal Medicine

## 2021-03-29 ENCOUNTER — Other Ambulatory Visit: Payer: Self-pay

## 2021-03-29 ENCOUNTER — Encounter (HOSPITAL_COMMUNITY): Payer: Self-pay | Admitting: *Deleted

## 2021-03-29 DIAGNOSIS — N760 Acute vaginitis: Secondary | ICD-10-CM | POA: Diagnosis not present

## 2021-03-29 DIAGNOSIS — B9689 Other specified bacterial agents as the cause of diseases classified elsewhere: Secondary | ICD-10-CM | POA: Diagnosis not present

## 2021-03-29 LAB — POCT URINALYSIS DIPSTICK, ED / UC
Bilirubin Urine: NEGATIVE
Glucose, UA: NEGATIVE mg/dL
Hgb urine dipstick: NEGATIVE
Ketones, ur: NEGATIVE mg/dL
Leukocytes,Ua: NEGATIVE
Nitrite: NEGATIVE
Protein, ur: NEGATIVE mg/dL
Specific Gravity, Urine: 1.02 (ref 1.005–1.030)
Urobilinogen, UA: 1 mg/dL (ref 0.0–1.0)
pH: 8 (ref 5.0–8.0)

## 2021-03-29 MED ORDER — METRONIDAZOLE 500 MG PO TABS
500.0000 mg | ORAL_TABLET | Freq: Two times a day (BID) | ORAL | 0 refills | Status: DC
Start: 2021-03-29 — End: 2022-05-09

## 2021-03-29 NOTE — ED Provider Notes (Signed)
Denies, [facility]    CSN: [id number] Arrival date & time: 03/29/21  1919      History   Chief Complaint Chief Complaint  Patient presents with   Urinary Tract Infection   BV    HPI Lori Short is a 25 y.o. female presenting with 1 week of vaginal discharge, history of BV.  Endorses 1 week of vaginal itching and discharge with faint odor.  Discharge is thin and clear.  Intermittent crampy right lower abdominal pain.  She has the same partner, denies STI risk. Some intermittent dysuria. Denies hematuria,, frequency, urgency, back pain, n/v/d, fevers/chills, abdnormal vaginal lesions/rashes.    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 06/04/2018   Pyelectasis of fetus on prenatal ultrasound 05/01/2018   Anemia affecting pregnancy in first trimester 03/15/2018   Supervision of other normal pregnancy, antepartum 03/12/2018   History of gestational hypertension 03/18/2017   Proteinuria affecting pregnancy in third trimester 03/18/2017   Edema during pregnancy in third trimester 03/18/2017   Rubella non-immune status, antepartum 11/20/2016   Substance abuse affecting pregnancy, antepartum 11/20/2016   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   Smoker 09/30/2014   Maternal obesity, antepartum 09/30/2014    Past Surgical History:  Procedure Laterality Date   CESAREAN SECTION     ORIF ANKLE FRACTURE Left 02/02/2020   Procedure: OPEN REDUCTION INTERNAL FIXATION (ORIF) ANKLE FRACTURE;  Surgeon: Teryl Lucy, MD;  Location: [facility];  Service: Orthopedics;  Laterality: Left;    OB History     Gravida  2   Para  1   Term  1   Preterm      AB      Living  1      SAB      IAB      Ectopic      Multiple  0   Live Births  1            Home Medications    Prior to Admission  medications   Medication Sig Start Date End Date Taking? Authorizing Provider  metroNIDAZOLE (FLAGYL) 500 MG tablet Take 1 tablet (500 mg total) by mouth 2 (two) times daily. 03/29/21  Yes Rhys Martini, PA-C  acetaminophen (TYLENOL) 500 MG tablet Take 500 mg by mouth every 6 (six) hours as needed.    [provider]  albuterol (PROVENTIL HFA;VENTOLIN HFA) 108 (90 Base) MCG/ACT inhaler Inhale 2 puffs into the lungs every 6 (six) hours as needed for wheezing or shortness of breath. 01/03/18   Menshew, [name], PA-C  APPLE CIDER VINEGAR PO Take by mouth.    [provider]  diphenhydrAMINE-APAP, sleep, (TYLENOL PM EXTRA STRENGTH PO) Take by mouth.    [provider]  ibuprofen (ADVIL) 800 MG tablet Take 1 tablet (800 mg total) by mouth 3 (three) times daily. 01/24/20   [name], PA-C  levonorgestrel (MIRENA) 20 MCG/24HR IUD by Intrauterine route. 10/03/18   [provider]  traMADol (ULTRAM) 50 MG tablet Take 50 mg by mouth 3 (three) times daily as needed. 03/18/20   [provider]  ferrous fumarate (HEMOCYTE - 106 MG FE) 325 (106 Fe) MG TABS tablet Take 1 tablet (106 mg of iron total) by mouth 2 (two) times daily. Take with juice; avoid dairy 30  min before and after taking pill. Patient not taking: Reported on 05/27/2018 03/13/18 05/07/19  [name], CNM    Family History Family History  Problem Relation Age of Onset   Hypertension Maternal Grandmother    Heart disease Maternal Grandmother     Social History Social History   Tobacco Use   Smoking status: Some Days    Packs/day: 0.25    Years: 0.00    Pack years: 0.00    Types: Cigarettes   Smokeless tobacco: Former  Building services engineer Use: Never used  Substance Use Topics   Alcohol use: Yes    Comment: occasional    Drug use: No     Allergies   Shellfish allergy   Review of Systems Review of Systems  Constitutional:  Negative for chills and fever.   HENT:  Negative for sore throat.   Eyes:  Negative for pain and redness.  Respiratory:  Negative for shortness of breath.   Cardiovascular:  Negative for chest pain.  Gastrointestinal:  Negative for abdominal pain, diarrhea, nausea and vomiting.  Genitourinary:  Positive for dysuria and vaginal discharge. Negative for decreased urine volume, difficulty urinating, flank pain, frequency, genital sores, hematuria and urgency.  Musculoskeletal:  Negative for back pain.  Skin:  Negative for rash.  All other systems reviewed and are negative.   Physical Exam Triage Vital Signs ED Triage Vitals [03/29/21 1957]  Enc Vitals Group     BP (!) 98/59     Pulse Rate (!) 48     Resp 18     Temp 99.3 F (37.4 C)     Temp src      SpO2 99 %     Weight      Height      Head Circumference      Peak Flow      Pain Score 2     Pain Loc      Pain Edu?      Excl. in GC?    No data found.  Updated Vital Signs BP (!) 98/59   Pulse (!) 48   Temp 99.3 F (37.4 C)   Resp 18   LMP 03/15/2021   SpO2 99%   Visual Acuity Right Eye Distance:   Left Eye Distance:   Bilateral Distance:    Right Eye Near:   Left Eye Near:    Bilateral Near:     Physical Exam Vitals reviewed.  Constitutional:      General: She is not in acute distress.    Appearance: Normal appearance. She is not ill-appearing.  HENT:     Head: Normocephalic and atraumatic.     Mouth/Throat:     Mouth: Mucous membranes are moist.     Comments: Moist mucous membranes Eyes:     Extraocular Movements: Extraocular movements intact.     Pupils: Pupils are equal, round, and reactive to light.  Cardiovascular:     Rate and Rhythm: Normal rate and regular rhythm.     Heart sounds: Normal heart sounds.  Pulmonary:     Effort: Pulmonary effort is normal.     Breath sounds: Normal breath sounds. No wheezing, rhonchi or rales.  Abdominal:     General: Bowel sounds are normal. There is no distension.     Palpations: Abdomen  is soft. There is no mass.     Tenderness: There is no abdominal tenderness. There is no right CVA tenderness, left CVA tenderness, guarding or rebound.  Genitourinary:    Comments: deferred Skin:    General: Skin is warm.     Capillary Refill: Capillary refill takes less than 2 seconds.     Comments: Good skin turgor  Neurological:     General: No focal deficit present.     Mental Status: [name], place, and time.  Psychiatric:        Mood and Affect: Mood normal.        Behavior: Behavior normal.     UC Treatments / Results  Labs (all labs ordered are listed, but only abnormal results are displayed) Labs Reviewed  POCT URINALYSIS DIPSTICK, ED / UC  CERVICOVAGINAL ANCILLARY ONLY    EKG   Radiology No results found.  Procedures Procedures (including critical care time)  Medications Ordered in UC Medications - No data to display  Initial Impression / Assessment and Plan / UC Course  I have reviewed the triage vital signs and the nursing notes.  Pertinent labs & imaging results that were available during my care of the patient were reviewed by me and considered in my medical decision making (see chart for details).     This patient is a very pleasant 25 y.o. year old female presenting with suspected BV.  Afebrile, nontachycardic, no reproducible abdominal pain or CVAT.   Mirena IUD for contraception Denies STI risk Self swab sent for BV, yeast, gonorrhea, chlamydia, trichomonas. Discussed risks and benefits of treating today versus waiting for lab results, Flagyl sent at patient request.  Blood pressure entered as 98/59, patient denies absolutely any shortness of breath, weakness, headaches, vision changes.  Suspect that this was entered in error, but nurse discharged patient without rechecking blood pressure as I directed.  ED return precautions discussed. Patient verbalizes understanding and agreement.   Final Clinical Impressions(s)  / UC Diagnoses   Final diagnoses:  Bacterial vaginitis     Discharge Instructions      -For bacterial vaginosis, start the antibiotic-Flagyl (metronidazole), 2 pills daily for 7 days.  You can take this with food if you have a sensitive stomach.  Avoid alcohol while taking this medication and for 2 days after as this will cause severe nausea and vomiting. -We are also testing for yeast, we will call you if this is positive and can send additional treatment at that time. -Seek additional medical attention if your symptoms worsen despite treatment, abdominal pain, back pain, fever/chills, any dizziness, shortness of breath, weakness, chest pain.   ED Prescriptions     Medication Sig Dispense Auth. Provider   metroNIDAZOLE (FLAGYL) 500 MG tablet Take 1 tablet (500 mg total) by mouth 2 (two) times daily. 14 tablet Rhys Martini, PA-C      PDMP not reviewed this encounter.   Rhys Martini, PA-C 03/29/21 2101

## 2021-03-29 NOTE — ED Triage Notes (Signed)
PT reports having Sx's for two weeks

## 2021-03-29 NOTE — Discharge Instructions (Addendum)
-  For bacterial vaginosis, start the antibiotic-Flagyl (metronidazole), 2 pills daily for 7 days.  You can take this with food if you have a sensitive stomach.  Avoid alcohol while taking this medication and for 2 days after as this will cause severe nausea and vomiting. -We are also testing for yeast, we will call you if this is positive and can send additional treatment at that time. -Seek additional medical attention if your symptoms worsen despite treatment, abdominal pain, back pain, fever/chills, any dizziness, shortness of breath, weakness, chest pain.

## 2021-03-30 LAB — CERVICOVAGINAL ANCILLARY ONLY
Bacterial Vaginitis (gardnerella): POSITIVE — AB
Candida Glabrata: NEGATIVE
Candida Vaginitis: NEGATIVE
Chlamydia: NEGATIVE
Comment: NEGATIVE
Comment: NEGATIVE
Comment: NEGATIVE
Comment: NEGATIVE
Comment: NEGATIVE
Comment: NORMAL
Neisseria Gonorrhea: NEGATIVE
Trichomonas: NEGATIVE

## 2022-05-08 ENCOUNTER — Encounter (HOSPITAL_COMMUNITY): Payer: Self-pay | Admitting: Emergency Medicine

## 2022-05-08 ENCOUNTER — Ambulatory Visit (HOSPITAL_COMMUNITY)
Admission: EM | Admit: 2022-05-08 | Discharge: 2022-05-08 | Disposition: A | Discharge status: Home / Self Care | Payer: Medicaid Other | Attending: Internal Medicine | Admitting: Internal Medicine

## 2022-05-08 DIAGNOSIS — N760 Acute vaginitis: Secondary | ICD-10-CM | POA: Diagnosis present

## 2022-05-08 DIAGNOSIS — N898 Other specified noninflammatory disorders of vagina: Secondary | ICD-10-CM | POA: Insufficient documentation

## 2022-05-08 MED ORDER — FLUCONAZOLE 150 MG PO TABS: 150.0000 mg | ORAL_TABLET | ORAL | 0 refills | Status: DC

## 2022-05-08 NOTE — ED Provider Notes (Signed)
[facility]    CSN: [id number] Arrival date & time: 05/08/22  6440      History   Chief Complaint Chief Complaint  Patient presents with   Vaginitis    HPI Lori Short is a 26 y.o. female.   Patient presents to urgent care for evaluation of 1 week history of vaginal itching and thin white vaginal discharge.  Patient has attempted use of Monistat over-the-counter without relief of vaginal itching and symptoms.  Denies recent new sexual partners/known exposure to STD.  Last menstrual cycle ended approximately 1 week ago.  Denies chance of pregnancy.  No urinary symptoms reported.  No fever/chills, nausea, vomiting, abdominal discomfort, back pain, headache, dizziness, or history of diabetes.  No other aggravating or relieving factors identified at this time.     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 06/04/2018   Pyelectasis of fetus on prenatal ultrasound 05/01/2018   Anemia affecting pregnancy in first trimester 03/15/2018   Supervision of other normal pregnancy, antepartum 03/12/2018   History of gestational hypertension 03/18/2017   Proteinuria affecting pregnancy in third trimester 03/18/2017   Edema during pregnancy in third trimester 03/18/2017   Rubella non-immune status, antepartum 11/20/2016   Substance abuse affecting pregnancy, antepartum 11/20/2016   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   Smoker 09/30/2014   Maternal obesity, antepartum 09/30/2014    Past Surgical History:  Procedure Laterality Date   CESAREAN SECTION     ORIF ANKLE FRACTURE Left 02/02/2020   Procedure: OPEN REDUCTION INTERNAL FIXATION (ORIF) ANKLE FRACTURE;  Surgeon: Teryl Lucy, MD;  Location: [facility];  Service: Orthopedics;  Laterality: Left;    OB History     Gravida  2   Para  1   Term  1   Preterm       AB      Living  1      SAB      IAB      Ectopic      Multiple  0   Live Births  1            Home Medications    Prior to Admission medications   Medication Sig Start Date End Date Taking? Authorizing Provider  fluconazole (DIFLUCAN) 150 MG tablet Take 1 tablet (150 mg total) by mouth every 3 (three) days. 05/08/22  Yes [name], FNP  acetaminophen (TYLENOL) 500 MG tablet Take 500 mg by mouth every 6 (six) hours as needed.    [provider]  albuterol (PROVENTIL HFA;VENTOLIN HFA) 108 (90 Base) MCG/ACT inhaler Inhale 2 puffs into the lungs every 6 (six) hours as needed for wheezing or shortness of breath. 01/03/18   Menshew, [name], PA-C  APPLE CIDER VINEGAR PO Take by mouth.    [provider]  diphenhydrAMINE-APAP, sleep, (TYLENOL PM EXTRA STRENGTH PO) Take by mouth.    [provider]  ibuprofen (ADVIL) 800 MG tablet Take 1 tablet (800 mg total) by mouth 3 (three) times daily. 01/24/20   [name], PA-C  levonorgestrel (MIRENA) 20 MCG/24HR IUD by Intrauterine route. 10/03/18   [provider]  metroNIDAZOLE (FLAGYL) 500 MG tablet Take 1 tablet (500 mg total) by mouth 2 (two) times daily. 03/29/21   Rhys Martini, PA-C  traMADol (ULTRAM) 50 MG tablet Take  50 mg by mouth 3 (three) times daily as needed. 03/18/20   [provider]  ferrous fumarate (HEMOCYTE - 106 MG FE) 325 (106 Fe) MG TABS tablet Take 1 tablet (106 mg of iron total) by mouth 2 (two) times daily. Take with juice; avoid dairy 30 min before and after taking pill. Patient not taking: Reported on 05/27/2018 03/13/18 05/07/19  [name], CNM    Family History Family History  Problem Relation Age of Onset   Hypertension Maternal Grandmother    Heart disease Maternal Grandmother     Social History Social History   Tobacco Use   Smoking status: Some Days    Packs/day: 0.25    Years: 0.00    Total pack years: 0.00     Types: Cigarettes   Smokeless tobacco: Former  Building services engineer Use: Never used  Substance Use Topics   Alcohol use: Yes    Comment: occasional    Drug use: No     Allergies   Shellfish allergy   Review of Systems Review of Systems Per HPI  Physical Exam Triage Vital Signs ED Triage Vitals [05/08/22 0823]  Enc Vitals Group     BP 127/85     Pulse Rate 77     Resp 16     Temp 98.3 F (36.8 C)     Temp Source Oral     SpO2 98 %     Weight      Height      Head Circumference      Peak Flow      Pain Score 0     Pain Loc      Pain Edu?      Excl. in GC?    No data found.  Updated Vital Signs BP 127/85 (BP Location: Right Arm)   Pulse 77   Temp 98.3 F (36.8 C) (Oral)   Resp 16   SpO2 98%   Visual Acuity Right Eye Distance:   Left Eye Distance:   Bilateral Distance:    Right Eye Near:   Left Eye Near:    Bilateral Near:     Physical Exam Vitals and nursing note reviewed.  Constitutional:      Appearance: Normal appearance. She is not ill-appearing or toxic-appearing.     Comments: Very pleasant patient sitting on exam in position of comfort table in no acute distress.   HENT:     Head: Normocephalic and atraumatic.     Right Ear: Hearing and external ear normal.     Left Ear: Hearing and external ear normal.     Nose: Nose normal.     Mouth/Throat:     Lips: Pink.     Mouth: Mucous membranes are moist.  Eyes:     General: Lids are normal. Vision grossly intact. Gaze aligned appropriately.     Extraocular Movements: Extraocular movements intact.     Conjunctiva/sclera: Conjunctivae normal.  Pulmonary:     Effort: Pulmonary effort is normal.  Abdominal:     Palpations: Abdomen is soft.  Genitourinary:    Comments: Deferred. Musculoskeletal:     Cervical back: Neck supple.  Skin:    General: Skin is warm and dry.     Capillary Refill: Capillary refill takes less than 2 seconds.     Findings: No rash.  Neurological:     General: No  focal deficit present.     Mental Status: [name], place,  and time. Mental status is at baseline.     Cranial Nerves: No dysarthria or facial asymmetry.     Gait: Gait is intact.  Psychiatric:        Mood and Affect: Mood normal.        Speech: Speech normal.        Behavior: Behavior normal.        Thought Content: Thought content normal.        Judgment: Judgment normal.      UC Treatments / Results  Labs (all labs ordered are listed, but only abnormal results are displayed) Labs Reviewed  CERVICOVAGINAL ANCILLARY ONLY    EKG   Radiology No results found.  Procedures Procedures (including critical care time)  Medications Ordered in UC Medications - No data to display  Initial Impression / Assessment and Plan / UC Course  I have reviewed the triage vital signs and the nursing notes.  Pertinent labs & imaging results that were available during my care of the patient were reviewed by me and considered in my medical decision making (see chart for details).   1.  STD screening and vaginal discharge/itching  STI labs pending.  Patient declines HIV and syphilis testing today.  Will notify patient of positive results and treat accordingly when labs come back.  Plan to treat empirically for vaginal yeast infection due to patient's symptoms with Diflucan once today then again in 3 days if symptoms or not improving.  We will treat for other positive results based on STI testing when it comes back.  Patient to avoid sexual intercourse until screening testing comes back.  Education provided regarding safe sexual practices and patient encouraged to use protection to prevent spread of STIs.   Discussed physical exam and available lab work findings in clinic with patient.  Counseled patient regarding appropriate use of medications and potential side effects for all medications recommended or prescribed today. Discussed red flag signs and symptoms of worsening  condition,when to call the PCP office, return to urgent care, and when to seek higher level of care in the emergency department. Patient verbalizes understanding and agreement with plan. All questions answered. Patient discharged in stable condition.  Final Clinical Impressions(s) / UC Diagnoses   Final diagnoses:  Acute vaginitis  Vaginal itching     Discharge Instructions      I suspect he may have a vaginal yeast infection.  Take Diflucan once today then again in 3 days if your symptoms are not improving.   The rest of your STI testing is pending and will come back in the next 2 to 3 days.  We will call you if anything else is positive requiring treatment at that time.   If you develop any new or worsening symptoms or do not improve in the next 2 to 3 days, please return.  If your symptoms are severe, please go to the emergency room.  Follow-up with your primary care provider for further evaluation and management of your symptoms as well as ongoing wellness visits.  I hope you feel better!     ED Prescriptions     Medication Sig Dispense Auth. Provider   fluconazole (DIFLUCAN) 150 MG tablet Take 1 tablet (150 mg total) by mouth every 3 (three) days. 2 tablet [name], FNP      PDMP not reviewed this encounter.   [name], [place] 05/08/22 [phone]

## 2022-05-08 NOTE — Discharge Instructions (Signed)
I suspect he may have a vaginal yeast infection.  Take Diflucan once today then again in 3 days if your symptoms are not improving.   The rest of your STI testing is pending and will come back in the next 2 to 3 days.  We will call you if anything else is positive requiring treatment at that time.   If you develop any new or worsening symptoms or do not improve in the next 2 to 3 days, please return.  If your symptoms are severe, please go to the emergency room.  Follow-up with your primary care provider for further evaluation and management of your symptoms as well as ongoing wellness visits.  I hope you feel better!

## 2022-05-08 NOTE — ED Triage Notes (Signed)
Vaginal itching, used monostat x 1 week w/o improvement, now believes there may be an odor. Hx of BV.

## 2022-05-09 ENCOUNTER — Telehealth (HOSPITAL_COMMUNITY): Payer: Self-pay | Admitting: Emergency Medicine

## 2022-05-09 LAB — CERVICOVAGINAL ANCILLARY ONLY
Bacterial Vaginitis (gardnerella): POSITIVE — AB
Candida Glabrata: NEGATIVE
Candida Vaginitis: NEGATIVE
Chlamydia: NEGATIVE
Comment: NEGATIVE
Comment: NEGATIVE
Comment: NEGATIVE
Comment: NEGATIVE
Comment: NEGATIVE
Comment: NORMAL
Neisseria Gonorrhea: NEGATIVE
Trichomonas: POSITIVE — AB

## 2022-05-09 MED ORDER — METRONIDAZOLE 500 MG PO TABS: 500.0000 mg | ORAL_TABLET | Freq: Two times a day (BID) | ORAL | 0 refills | Status: DC

## 2022-11-10 ENCOUNTER — Emergency Department (HOSPITAL_COMMUNITY)
Admission: EM | Admit: 2022-11-10 | Discharge: 2022-11-10 | Disposition: A | Discharge status: Home / Self Care | Payer: Medicaid Other | Attending: Emergency Medicine | Admitting: Emergency Medicine

## 2022-11-10 ENCOUNTER — Other Ambulatory Visit: Payer: Self-pay

## 2022-11-10 DIAGNOSIS — J101 Influenza due to other identified influenza virus with other respiratory manifestations: Secondary | ICD-10-CM | POA: Diagnosis not present

## 2022-11-10 DIAGNOSIS — B349 Viral infection, unspecified: Secondary | ICD-10-CM | POA: Insufficient documentation

## 2022-11-10 DIAGNOSIS — R5383 Other fatigue: Secondary | ICD-10-CM | POA: Diagnosis present

## 2022-11-10 DIAGNOSIS — Z1152 Encounter for screening for COVID-19: Secondary | ICD-10-CM | POA: Diagnosis not present

## 2022-11-10 LAB — CBC WITH DIFFERENTIAL/PLATELET
Abs Immature Granulocytes: 0.01 10*3/uL (ref 0.00–0.07)
Basophils Absolute: 0 10*3/uL (ref 0.0–0.1)
Basophils Relative: 1 %
Eosinophils Absolute: 0 10*3/uL (ref 0.0–0.5)
Eosinophils Relative: 0 %
HCT: 39.3 % (ref 36.0–46.0)
Hemoglobin: 12.1 g/dL (ref 12.0–15.0)
Immature Granulocytes: 0 %
Lymphocytes Relative: 34 %
Lymphs Abs: 1.3 10*3/uL (ref 0.7–4.0)
MCH: 23.5 pg — ABNORMAL LOW (ref 26.0–34.0)
MCHC: 30.8 g/dL (ref 30.0–36.0)
MCV: 76.3 fL — ABNORMAL LOW (ref 80.0–100.0)
Monocytes Absolute: 0.6 10*3/uL (ref 0.1–1.0)
Monocytes Relative: 16 %
Neutro Abs: 1.9 10*3/uL (ref 1.7–7.7)
Neutrophils Relative %: 49 %
Platelets: 303 10*3/uL (ref 150–400)
RBC: 5.15 MIL/uL — ABNORMAL HIGH (ref 3.87–5.11)
RDW: 15.2 % (ref 11.5–15.5)
WBC: 3.8 10*3/uL — ABNORMAL LOW (ref 4.0–10.5)
nRBC: 0 % (ref 0.0–0.2)

## 2022-11-10 LAB — BASIC METABOLIC PANEL
Anion gap: 8 (ref 5–15)
BUN: 8 mg/dL (ref 6–20)
CO2: 24 mmol/L (ref 22–32)
Calcium: 8.8 mg/dL — ABNORMAL LOW (ref 8.9–10.3)
Chloride: 101 mmol/L (ref 98–111)
Creatinine, Ser: 0.98 mg/dL (ref 0.44–1.00)
GFR, Estimated: >60 mL/min (ref >60)
Glucose, Bld: 102 mg/dL — ABNORMAL HIGH (ref 70–99)
Potassium: 3.9 mmol/L (ref 3.5–5.1)
Sodium: 133 mmol/L — ABNORMAL LOW (ref 135–145)

## 2022-11-10 LAB — RESP PANEL BY RT-PCR (RSV, FLU A&B, COVID)  RVPGX2
Influenza A by PCR: NEGATIVE
Influenza B by PCR: POSITIVE — AB
Resp Syncytial Virus by PCR: NEGATIVE
SARS Coronavirus 2 by RT PCR: NEGATIVE

## 2022-11-10 LAB — SARS CORONAVIRUS 2 BY RT PCR: SARS Coronavirus 2 by RT PCR: NEGATIVE

## 2022-11-10 LAB — I-STAT BETA HCG BLOOD, ED (MC, WL, AP ONLY): I-stat hCG, quantitative: <5 m[IU]/mL (ref <5)

## 2022-11-10 MED ORDER — IBUPROFEN 400 MG PO TABS
600.0000 mg | ORAL_TABLET | Freq: Once | ORAL | Status: AC
  Administered 2022-11-10: 600 mg via ORAL
  Filled 2022-11-10: qty 1

## 2022-11-10 MED ORDER — ONDANSETRON 4 MG PO TBDP
4.0000 mg | ORAL_TABLET | Freq: Once | ORAL | Status: AC
  Administered 2022-11-10: 4 mg via ORAL
  Filled 2022-11-10: qty 1

## 2022-11-10 NOTE — Discharge Instructions (Addendum)
You were seen in the ER today for your cold symptoms. You have tested negative for COVID-19. Your blood work was reassuring today. You likely have a different viral illness. You may follow your flu-test results in your mychart account, you may continue using over the counter medications for symptom management.   Return to the ER with any new severe symptoms.

## 2022-11-10 NOTE — ED Provider Notes (Signed)
[facility] Provider Note   CSN: [id number] Arrival date & time: 11/10/22  1938     History  Chief Complaint  Patient presents with   Poor Appetite / Fatigue / Can't taste     ODIS RUSNAK is a 27 y.o. female who presents with concern for fatigue, decreased appetite, congestion, decreased taste, nausea x 3 days. 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   Hx of fetal anomalies in pregnancies; no meds daily.   HPI     Home Medications Prior to Admission medications   Medication Sig Start Date End Date Taking? Authorizing Provider  acetaminophen (TYLENOL) 500 MG tablet Take 500 mg by mouth every 6 (six) hours as needed.    [provider]  albuterol (PROVENTIL HFA;VENTOLIN HFA) 108 (90 Base) MCG/ACT inhaler Inhale 2 puffs into the lungs every 6 (six) hours as needed for wheezing or shortness of breath. 01/03/18   Menshew, Dannielle Karvonen, PA-C  APPLE CIDER VINEGAR PO Take by mouth.    [provider]  diphenhydrAMINE-APAP, sleep, (TYLENOL PM EXTRA STRENGTH PO) Take by mouth.    [provider]  fluconazole (DIFLUCAN) 150 MG tablet Take 1 tablet (150 mg total) by mouth every 3 (three) days. 05/08/22   Talbot Grumbling, FNP  ibuprofen (ADVIL) 800 MG tablet Take 1 tablet (800 mg total) by mouth 3 (three) times daily. 01/24/20   Larene Pickett, PA-C  levonorgestrel (MIRENA) 20 MCG/24HR IUD by Intrauterine route. 10/03/18   [provider]  metroNIDAZOLE (FLAGYL) 500 MG tablet Take 1 tablet (500 mg total) by mouth 2 (two) times daily. 05/09/22   Lamptey, Myrene Galas, MD  traMADol (ULTRAM) 50 MG tablet Take 50 mg by mouth 3 (three) times daily as needed. 03/18/20   [provider]  ferrous fumarate (HEMOCYTE - 106 MG FE) 325 (106 Fe) MG TABS tablet Take 1 tablet (106 mg of iron total) by mouth 2 (two) times daily. Take with juice; avoid dairy 30 min  before and after taking pill. Patient not taking: Reported on 05/27/2018 03/13/18 05/07/19  Starr Lake, CNM      Allergies    Shellfish allergy    Review of Systems   Review of Systems  Constitutional:  Positive for appetite change, chills, fatigue and fever. Negative for activity change.  HENT:  Positive for congestion. Negative for sore throat, tinnitus and trouble swallowing.   Eyes: Negative.   Respiratory:  Positive for cough. Negative for shortness of breath.   Cardiovascular: Negative.   Gastrointestinal:  Positive for nausea. Negative for abdominal pain, diarrhea and vomiting.  Genitourinary: Negative.   Musculoskeletal: Negative.   Neurological: Negative.     Physical Exam Updated Vital Signs BP 115/66   Pulse 91   Temp (!) 100.4 F (38 C)   Resp (!) 21   SpO2 100%  Physical Exam Vitals and nursing note reviewed.  Constitutional:      Appearance: She is obese. She is not ill-appearing or toxic-appearing.  HENT:     Head: Normocephalic and atraumatic.     Nose: Congestion and rhinorrhea present.     Mouth/Throat:     Mouth: Mucous membranes are moist.     Pharynx: No oropharyngeal exudate or posterior oropharyngeal erythema.  Eyes:     General:        Right eye: No discharge.        Left eye:  No discharge.     Extraocular Movements: Extraocular movements intact.     Conjunctiva/sclera: Conjunctivae normal.     Pupils: Pupils are equal, round, and reactive to light.  Neck:     Trachea: Trachea and phonation normal.  Cardiovascular:     Rate and Rhythm: Normal rate and regular rhythm.     Pulses: Normal pulses.     Heart sounds: Normal heart sounds. No murmur heard. Pulmonary:     Effort: Pulmonary effort is normal. No respiratory distress.     Breath sounds: Normal breath sounds. No wheezing or rales.  Abdominal:     General: Bowel sounds are normal. There is no distension.     Palpations: Abdomen is soft.     Tenderness: There is no  abdominal tenderness. There is no guarding or rebound.  Musculoskeletal:        General: No deformity.     Cervical back: Normal range of motion and neck supple.     Right lower leg: No edema.     Left lower leg: No edema.  Lymphadenopathy:     Cervical: Cervical adenopathy present.     Right cervical: Superficial cervical adenopathy present.     Left cervical: Superficial cervical adenopathy present.  Skin:    General: Skin is warm and dry.     Capillary Refill: Capillary refill takes less than 2 seconds.  Neurological:     General: No focal deficit present.     Mental Status: [name], place, and time. Mental status is at baseline.  Psychiatric:        Mood and Affect: Mood normal.     ED Results / Procedures / Treatments   Labs (all labs ordered are listed, but only abnormal results are displayed) Labs Reviewed  CBC WITH DIFFERENTIAL/PLATELET - Abnormal; Notable for the following components:      Result Value   WBC 3.8 (*)    RBC 5.15 (*)    MCV 76.3 (*)    MCH 23.5 (*)    All other components within normal limits  BASIC METABOLIC PANEL - Abnormal; Notable for the following components:   Sodium 133 (*)    Glucose, Bld 102 (*)    Calcium 8.8 (*)    All other components within normal limits  SARS CORONAVIRUS 2 BY RT PCR  RESP PANEL BY RT-PCR (RSV, FLU A&B, COVID)  [id number]  I-STAT BETA HCG BLOOD, ED ([facility], [facility], [facility] ONLY)    EKG None  Radiology No results found.  Procedures Procedures    Medications Ordered in ED Medications  ondansetron (ZOFRAN-ODT) disintegrating tablet 4 mg (has no administration in time range)  ibuprofen (ADVIL) tablet 600 mg (has no administration in time range)    ED Course/ Medical Decision Making/ A&P                             Medical Decision Making 27 year old female who presents with Flu-like symptoms.   Febrile on intake, mildly tachycardic. VS otherwise normal. Cardiopulmonary exam is normal,  abdominal exam is benign, nasal congestion.   Amount and/or Complexity of Data Reviewed Labs:     Details: CBC with mild leukopenia 3.8 no anemia, BMP with mild hyponatremia 133, pregnancy test negative. COVID-19 negative.  Flu-test pending.  Risk Prescription drug management.   Clinical picture most consistent with acute viral syndrome. Clinical concern for emergent underlying etiology that would warrant  further ED workup or inpatient management is exceedingly low. Zofran and ibuprofen administered in the ED. Tolerating PO.   Marilu voiced understanding of her medical evaluation and treatment plan. Each of their questions answered to their expressed satisfaction.  Return precautions were given.  Patient is well-appearing, stable, and was discharged in good condition.  This chart was dictated using voice recognition software, Dragon. Despite the best efforts of this provider to proofread and correct errors, errors may still occur which can change documentation meaning.   Final Clinical Impression(s) / ED Diagnoses Final diagnoses:  Viral syndrome    Rx / DC Orders ED Discharge Orders     None         Aura Dials 11/10/22 2259    Drenda Freeze, MD 11/10/22 [phone]

## 2022-11-10 NOTE — ED Triage Notes (Signed)
Patient reports fatigue with poor appetite and unable to taste onset this week .

## 2023-04-03 ENCOUNTER — Encounter (HOSPITAL_COMMUNITY): Payer: Self-pay | Admitting: Emergency Medicine

## 2023-04-03 ENCOUNTER — Ambulatory Visit (HOSPITAL_COMMUNITY)
Admission: EM | Admit: 2023-04-03 | Discharge: 2023-04-03 | Disposition: A | Discharge status: Home / Self Care | Payer: Medicaid Other | Attending: Emergency Medicine | Admitting: Emergency Medicine

## 2023-04-03 DIAGNOSIS — R450 Nervousness: Secondary | ICD-10-CM

## 2023-04-03 NOTE — Discharge Instructions (Addendum)
Today you are evaluated for a jittery sensation which may be related to your high caffeine intake  On exam lungs are clear breath sounds are heard vital signs are within normal range at this time  EKG for the heart is beating in a normal pace and rhythm  Blood work checking your electrolytes, liver, kidney and blood levels are pending, you will be notified of any concerning symptoms.    Please slowly reduce your caffeine intake to see if this makes a difference in your symptoms, reduce by 1 cup/week  If your symptoms continue to persist you will need to establish care with a primary doctor for further evaluation and management may follow-up with urgent care as needed

## 2023-04-03 NOTE — ED Provider Notes (Signed)
[facility]    CSN: [id number] Arrival date & time: 04/03/23  1436      History   Chief Complaint Chief Complaint  Patient presents with   jittery    HPI Lori Short is a 27 y.o. female.   Patient presents for evaluation of a sensation of jitteriness occurring intermittently for 2 weeks.  Jitteriness is described as a tingling sensation throughout the entirety of the body.  Symptoms occurring at least every other day, do not seem to have a pattern, have occurred overnight as well as throughout the day, occurs during times of food consumption.  Episodes last at least 10 minutes before spontaneous resolution, occurring at least twice within a 24-hour period.  During episode feels as if heart is racing but denies skipping beat.  Works night shift, endorses high caffeine intake through sweet tea, coffee and [facility], has at least 3 cups of caffeine each shift.  Denies cardiac history, has familial history of arrhythmias.  Denies dizziness, lightheadedness, visual changes, headaches, shortness of breath, chest pain or tightness.  Sometimes tobacco use.      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 06/04/2018   Pyelectasis of fetus on prenatal ultrasound 05/01/2018   Anemia affecting pregnancy in first trimester 03/15/2018   Supervision of other normal pregnancy, antepartum 03/12/2018   History of gestational hypertension 03/18/2017   Proteinuria affecting pregnancy in third trimester 03/18/2017   Edema during pregnancy in third trimester 03/18/2017   Rubella non-immune status, antepartum 11/20/2016   Substance abuse affecting pregnancy, antepartum (HCC) 11/20/2016   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   Smoker 09/30/2014   Maternal obesity, antepartum 09/30/2014    Past Surgical History:  Procedure Laterality Date    CESAREAN SECTION     ORIF ANKLE FRACTURE Left 02/02/2020   Procedure: OPEN REDUCTION INTERNAL FIXATION (ORIF) ANKLE FRACTURE;  Surgeon: Teryl Lucy, MD;  Location: [facility];  Service: Orthopedics;  Laterality: Left;    OB History     Gravida  2   Para  1   Term  1   Preterm      AB      Living  1      SAB      IAB      Ectopic      Multiple  0   Live Births  1            Home Medications    Prior to Admission medications   Medication Sig Start Date End Date Taking? Authorizing Provider  acetaminophen (TYLENOL) 500 MG tablet Take 500 mg by mouth every 6 (six) hours as needed.    [provider]  albuterol (PROVENTIL HFA;VENTOLIN HFA) 108 (90 Base) MCG/ACT inhaler Inhale 2 puffs into the lungs every 6 (six) hours as needed for wheezing or shortness of breath. 01/03/18   Menshew, [name], PA-C  APPLE CIDER VINEGAR PO Take by mouth.    [provider]  diphenhydrAMINE-APAP, sleep, (TYLENOL PM EXTRA STRENGTH PO) Take by mouth.    [provider]  fluconazole (DIFLUCAN) 150 MG tablet Take 1 tablet (150 mg total) by mouth every 3 (three) days. 05/08/22   [name], FNP  ibuprofen (ADVIL) 800 MG tablet Take 1 tablet (800 mg total) by mouth 3 (three) times daily.  01/24/20   [name], PA-C  levonorgestrel (MIRENA) 20 MCG/24HR IUD by Intrauterine route. 10/03/18   [provider]  metroNIDAZOLE (FLAGYL) 500 MG tablet Take 1 tablet (500 mg total) by mouth 2 (two) times daily. 05/09/22   Lamptey, [name], MD  traMADol (ULTRAM) 50 MG tablet Take 50 mg by mouth 3 (three) times daily as needed. 03/18/20   [provider]  ferrous fumarate (HEMOCYTE - 106 MG FE) 325 (106 Fe) MG TABS tablet Take 1 tablet (106 mg of iron total) by mouth 2 (two) times daily. Take with juice; avoid dairy 30 min before and after taking pill. Patient not taking: Reported on 05/27/2018 03/13/18 05/07/19  [name], CNM    Family History Family History  Problem Relation Age of Onset   Hypertension Maternal Grandmother    Heart disease Maternal Grandmother     Social History Social History   Tobacco Use   Smoking status: Some Days    Packs/day: 0.25    Years: 0.00    Additional pack years: 0.00    Total pack years: 0.00    Types: Cigarettes   Smokeless tobacco: Former  Building services engineer Use: Never used  Substance Use Topics   Alcohol use: Yes    Comment: occasional    Drug use: No     Allergies   Shellfish allergy   Review of Systems Review of Systems   Physical Exam Triage Vital Signs ED Triage Vitals  Enc Vitals Group     BP 04/03/23 1448 (!) 130/95     Pulse Rate 04/03/23 1448 78     Resp 04/03/23 1448 16     Temp 04/03/23 1448 98.5 F (36.9 C)     Temp Source 04/03/23 1448 Oral     SpO2 04/03/23 1448 98 %     Weight --      Height --      Head Circumference --      Peak Flow --      Pain Score 04/03/23 1450 0     Pain Loc --      Pain Edu? --      Excl. in GC? --    No data found.  Updated Vital Signs BP (!) 130/95 (BP Location: Left Arm)   Pulse 78   Temp 98.5 F (36.9 C) (Oral)   Resp 16   SpO2 98%   Visual Acuity Right Eye Distance:   Left Eye Distance:   Bilateral Distance:    Right Eye Near:   Left Eye Near:    Bilateral Near:     Physical Exam Constitutional:      Appearance: Normal appearance.  Eyes:     Extraocular Movements: Extraocular movements intact.  Cardiovascular:     Rate and Rhythm: Normal rate and regular rhythm.     Pulses: Normal pulses.     Heart sounds: Normal heart sounds.  Pulmonary:     Effort: Pulmonary effort is normal.     Breath sounds: Normal breath sounds.  Neurological:     General: No focal deficit present.     Mental Status: [name], place, and time. Mental status is at baseline.     Cranial Nerves: No cranial nerve deficit.     Sensory: No sensory deficit.      Motor: No weakness.     Coordination: Coordination normal.     Gait: Gait normal.  UC Treatments / Results  Labs (all labs ordered are listed, but only abnormal results are displayed) Labs Reviewed - No data to display  EKG   Radiology No results found.  Procedures Procedures (including critical care time)  Medications Ordered in UC Medications - No data to display  Initial Impression / Assessment and Plan / UC Course  I have reviewed the triage vital signs and the nursing notes.  Pertinent labs & imaging results that were available during my care of the patient were reviewed by me and considered in my medical decision making (see chart for details).  Jittery feeling  Vital signs are stable, Patient is in no signs of distress or toxic.,  S1-S2 noted auscultation as well as lungs are clear, EKG shows normal sinus rhythm.,  No neurological deficits present, stable for outpatient management, possibly related to high caffeine intake, instructed patient to slowly taper over the next 2 weeks and to monitor symptoms, CMP and CBC pending, symptoms continue to persist patient to establish care with PCP and may follow-up with urgent care as needed Final Clinical Impressions(s) / UC Diagnoses   Final diagnoses:  None   Discharge Instructions   None    ED Prescriptions   None    PDMP not reviewed this encounter.   Valinda Hoar, NP 04/03/23 1610

## 2023-04-03 NOTE — ED Triage Notes (Addendum)
Patient reports that she has been having "jittery " episodes x 2 weeks approx every other day. 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  Patient states she is currently not having "the jitters" now.

## 2023-06-03 ENCOUNTER — Other Ambulatory Visit: Payer: Self-pay

## 2023-06-03 ENCOUNTER — Emergency Department (HOSPITAL_COMMUNITY): Payer: 59

## 2023-06-03 ENCOUNTER — Emergency Department (HOSPITAL_COMMUNITY)
Admission: EM | Admit: 2023-06-03 | Discharge: 2023-06-03 | Disposition: A | Discharge status: Home / Self Care | Payer: 59 | Attending: Emergency Medicine | Admitting: Emergency Medicine

## 2023-06-03 ENCOUNTER — Encounter (HOSPITAL_COMMUNITY): Payer: Self-pay

## 2023-06-03 DIAGNOSIS — D75839 Thrombocytosis, unspecified: Secondary | ICD-10-CM | POA: Diagnosis not present

## 2023-06-03 DIAGNOSIS — F17200 Nicotine dependence, unspecified, uncomplicated: Secondary | ICD-10-CM | POA: Diagnosis not present

## 2023-06-03 DIAGNOSIS — R079 Chest pain, unspecified: Secondary | ICD-10-CM | POA: Diagnosis not present

## 2023-06-03 DIAGNOSIS — R0789 Other chest pain: Secondary | ICD-10-CM | POA: Diagnosis not present

## 2023-06-03 DIAGNOSIS — D649 Anemia, unspecified: Secondary | ICD-10-CM | POA: Insufficient documentation

## 2023-06-03 LAB — CBC
HCT: 37 % (ref 36.0–46.0)
Hemoglobin: 11.1 g/dL — ABNORMAL LOW (ref 12.0–15.0)
MCH: 23.2 pg — ABNORMAL LOW (ref 26.0–34.0)
MCHC: 30 g/dL (ref 30.0–36.0)
MCV: 77.2 fL — ABNORMAL LOW (ref 80.0–100.0)
Platelets: 421 10*3/uL — ABNORMAL HIGH (ref 150–400)
RBC: 4.79 MIL/uL (ref 3.87–5.11)
RDW: 15.5 % (ref 11.5–15.5)
WBC: 6.8 10*3/uL (ref 4.0–10.5)
nRBC: 0 % (ref 0.0–0.2)

## 2023-06-03 LAB — BASIC METABOLIC PANEL
Anion gap: 9 (ref 5–15)
BUN: 12 mg/dL (ref 6–20)
CO2: 23 mmol/L (ref 22–32)
Calcium: 9.5 mg/dL (ref 8.9–10.3)
Chloride: 103 mmol/L (ref 98–111)
Creatinine, Ser: 0.76 mg/dL (ref 0.44–1.00)
GFR, Estimated: >60 mL/min (ref >60)
Glucose, Bld: 96 mg/dL (ref 70–99)
Potassium: 3.6 mmol/L (ref 3.5–5.1)
Sodium: 135 mmol/L (ref 135–145)

## 2023-06-03 LAB — TROPONIN I (HIGH SENSITIVITY): Troponin I (High Sensitivity): <2 ng/L (ref <18)

## 2023-06-03 NOTE — Discharge Instructions (Addendum)
You are seen in the emergency department today for concerns of chest pain.  Your labs, EKG, and x-ray were thankfully reassuring without any acute findings to explain his symptoms.  Appears highly unlikely that this is heart related.  I would encourage following up with your primary care provider for repeat evaluation.  In the meantime, please take over-the-counter medications for pain control such as ibuprofen, Aleve, Tylenol.  If symptoms worsen, return to the emergency department.

## 2023-06-03 NOTE — ED Provider Notes (Signed)
[name] Provider Note   CSN: [id number] Arrival date & time: 06/03/23  1018     History Chief Complaint  Patient presents with   Chest Pain    Lori Short is a 27 y.o. female.  Patient with past medical history significant for gestational hypertension, anemia in pregnancy, the past history of substance abuse comes to the emergency department concerns of chest pain.  Reports that the pain has been present for the last 2 days or so and central nature without radiation.  Concerned about possible mold exposure causing her symptoms.  Denies any shortness of breath, cough, wheezing, nausea, vomiting, diaphoresis.  Not current on any medications for blood pressure or cholesterol.  Prior history of smoking.   Chest Pain      Home Medications Prior to Admission medications   Medication Sig Start Date End Date Taking? Authorizing Provider  acetaminophen (TYLENOL) 500 MG tablet Take 500 mg by mouth every 6 (six) hours as needed.    [provider]  albuterol (PROVENTIL HFA;VENTOLIN HFA) 108 (90 Base) MCG/ACT inhaler Inhale 2 puffs into the lungs every 6 (six) hours as needed for wheezing or shortness of breath. 01/03/18   Menshew, [name], PA-C  APPLE CIDER VINEGAR PO Take by mouth.    [provider]  diphenhydrAMINE-APAP, sleep, (TYLENOL PM EXTRA STRENGTH PO) Take by mouth.    [provider]  fluconazole (DIFLUCAN) 150 MG tablet Take 1 tablet (150 mg total) by mouth every 3 (three) days. 05/08/22   [name], FNP  ibuprofen (ADVIL) 800 MG tablet Take 1 tablet (800 mg total) by mouth 3 (three) times daily. 01/24/20   [name], PA-C  levonorgestrel (MIRENA) 20 MCG/24HR IUD by Intrauterine route. 10/03/18   [provider]  metroNIDAZOLE (FLAGYL) 500 MG tablet Take 1 tablet (500 mg total) by mouth 2 (two) times daily. 05/09/22   Lamptey, [name], MD  traMADol (ULTRAM) 50 MG tablet Take  50 mg by mouth 3 (three) times daily as needed. 03/18/20   [provider]  ferrous fumarate (HEMOCYTE - 106 MG FE) 325 (106 Fe) MG TABS tablet Take 1 tablet (106 mg of iron total) by mouth 2 (two) times daily. Take with juice; avoid dairy 30 min before and after taking pill. Patient not taking: Reported on 05/27/2018 03/13/18 05/07/19  [name], CNM      Allergies    Shellfish allergy    Review of Systems   Review of Systems  Cardiovascular:  Positive for chest pain.  All other systems reviewed and are negative.   Physical Exam Updated Vital Signs BP 126/74   Pulse 76   Temp 98 F (36.7 C) (Oral)   Resp 20   Ht 5\' 5"  (1.651 m)   Wt 104.3 kg   SpO2 100%   BMI 38.27 kg/m  Physical Exam Vitals and nursing note reviewed.  Constitutional:      General: She is not in acute distress.    Appearance: She is well-developed.  HENT:     Head: Normocephalic and atraumatic.  Eyes:     Conjunctiva/sclera: Conjunctivae normal.  Cardiovascular:     Rate and Rhythm: Normal rate and regular rhythm.     Heart sounds: Normal heart sounds. No murmur heard. Pulmonary:     Effort: Pulmonary effort is normal. No tachypnea or respiratory distress.     Breath sounds: Normal breath sounds. No decreased breath sounds.  Abdominal:     Palpations: Abdomen is soft.     Tenderness: There is no abdominal tenderness.  Musculoskeletal:        General: No swelling.     Cervical back: Neck supple.     Comments: Pain to palpation of central chest along sternum with anterior and posterior compression  Skin:    General: Skin is warm and dry.     Capillary Refill: Capillary refill takes less than 2 seconds.  Neurological:     Mental Status: She is alert.  Psychiatric:        Mood and Affect: Mood normal.     ED Results / Procedures / Treatments   Labs (all labs ordered are listed, but only abnormal results are displayed) Labs Reviewed  CBC - Abnormal; Notable for the  following components:      Result Value   Hemoglobin 11.1 (*)    MCV 77.2 (*)    MCH 23.2 (*)    Platelets 421 (*)    All other components within normal limits  BASIC METABOLIC PANEL  TROPONIN I (HIGH SENSITIVITY)    EKG None  Radiology DG Chest 2 View  Result Date: 06/03/2023 CLINICAL DATA:  2 day history of chest pain. EXAM: CHEST - 2 VIEW COMPARISON:  01/03/2018 FINDINGS: The lungs are clear without focal pneumonia, edema, pneumothorax or pleural effusion. The cardiopericardial silhouette is within normal limits for size. No acute bony abnormality. Telemetry leads overlie the chest. IMPRESSION: No active cardiopulmonary disease. Electronically Signed   By: [name] M.D.   On: 06/03/2023 11:05    Procedures Procedures   Medications Ordered in ED Medications - No data to display  ED Course/ Medical Decision Making/ A&P Clinical Course as of 06/04/23 1451  Mon Jun 03, 2023  1114 DG Chest 2 View [OZ]    Clinical Course User Index [OZ] Smitty Knudsen, PA-C                               Medical Decision Making Amount and/or Complexity of Data Reviewed Labs: ordered. Radiology: ordered. Decision-making details documented in ED Course.   This patient presents to the ED for concern of chest pain.  Differential diagnosis includes ACS, PE, pneumonia, bronchitis, costochondritis, pleurisy   Lab Tests:  I Ordered, and personally interpreted labs.  The pertinent results include: CBC with mild decrease in hemoglobin 11.1, MCV and MCH also decreased, platelets elevated at 421, BMP unremarkable, troponin negative   Imaging Studies ordered:  I ordered imaging studies including chest x-ray I independently visualized and interpreted imaging which showed no acute cardiopulmonary process I agree with the radiologist interpretation   Problem List / ED Course:  Patient with past medical history significant for gestational hypertension, substance use history, smoking history,  and anemia in pregnancy presents the emergency department concerns of chest pain.  Reports this pain has been present for the last 2 days or so and central in nature.  Denies radiation into the left or right upper extremity.  Does report concerns for possible mold exposure that may be accounting for symptoms.  Denies any shortness of breath, wheezing, cough, congestion.  Also denies any nausea vomiting or diaphoresis with the onset of chest pain.  Low concern for ACS at this time.  However we will still proceed with lab workup including troponin and basic labs as well as chest x-ray and EKG. EKG shows that patient is in  normal sinus rhythm and low concern for acute MI.  Will correlate findings of EKG with troponin level.  At this point highly doubt cardiac process.  Appears the patient symptoms are more consistent with costochondritis as there is pain in the central chest with compression at the anterior and posterior aspects of the chest wall.  Will await for labs for dispositioning patient. Patient's labs are unremarkable.  CBC, BMP and troponin are all unremarkable and negative.  No acute concern for cardiac process again patient symptoms.  Patient is PERC negative.  Advise following up with patient's PCP outpatient for further evaluation.  Discussed return precautions.  Patient agreeable current treatment plan verbalized understanding return precautions.  All questions answered prior to patient discharge.  Patient discharged home in stable condition.  Final Clinical Impression(s) / ED Diagnoses Final diagnoses:  Other chest pain    Rx / DC Orders ED Discharge Orders     None         Smitty Knudsen, PA-C 06/04/23 1452    Gloris Manchester, MD 06/04/23 1800

## 2023-06-03 NOTE — ED Triage Notes (Signed)
Patient has had centralized chest pain for 2 days. No radiation. Stated she has been exposed to mold in her apartment recently. No nausea or vomiting.

## 2023-07-02 ENCOUNTER — Telehealth: Payer: Self-pay

## 2023-07-02 NOTE — Telephone Encounter (Signed)
Transition Care Management Unsuccessful Follow-up Telephone Call  Date of discharge and from where:  [facility] 9/9  Attempts:  2nd Attempt  Reason for unsuccessful TCM follow-up call:  No answer/busy

## 2023-07-02 NOTE — Telephone Encounter (Signed)
Transition Care Management Unsuccessful Follow-up Telephone Call  Date of discharge and from where:  [facility]  Attempts:  1st Attempt  Reason for unsuccessful TCM follow-up call:  No answer/busy   Lenard Forth [place]  [facility], [facility], Phone: [phone] Website: [email or web address]

## 2023-07-23 ENCOUNTER — Ambulatory Visit
Admission: EM | Admit: 2023-07-23 | Discharge: 2023-07-23 | Disposition: A | Discharge status: Home / Self Care | Payer: 59 | Attending: Internal Medicine | Admitting: Internal Medicine

## 2023-07-23 DIAGNOSIS — J020 Streptococcal pharyngitis: Secondary | ICD-10-CM

## 2023-07-23 LAB — POCT RAPID STREP A (OFFICE): Rapid Strep A Screen: POSITIVE — AB

## 2023-07-23 MED ORDER — LIDOCAINE VISCOUS HCL 2 % MT SOLN: 15.0000 mL | Freq: Four times a day (QID) | OROMUCOSAL | 0 refills | Status: AC | PRN

## 2023-07-23 MED ORDER — AMOXICILLIN 500 MG PO CAPS: 500.0000 mg | ORAL_CAPSULE | Freq: Two times a day (BID) | ORAL | 0 refills | Status: AC

## 2023-07-23 NOTE — ED Provider Notes (Signed)
[facility]    CSN: [id number] Arrival date & time: 07/23/23  0813      History   Chief Complaint Chief Complaint  Patient presents with   Sore Throat    HPI Lori Short is a 27 y.o. female  presents for evaluation of URI symptoms for 2 days. Patient reports associated symptoms of sore throat and nasal congestion. Denies N/V/D, fevers, cough, ear pain, body aches, shortness of breath. Patient does not have a hx of asthma. Patient does have a history of smoking.    Pt has taken Tylenol, teas OTC for symptoms. Pt has no other concerns at this time.    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 06/04/2018   Pyelectasis of fetus on prenatal ultrasound 05/01/2018   Anemia affecting pregnancy in first trimester 03/15/2018   Supervision of other normal pregnancy, antepartum 03/12/2018   History of gestational hypertension 03/18/2017   Proteinuria affecting pregnancy in third trimester 03/18/2017   Edema during pregnancy in third trimester 03/18/2017   Rubella non-immune status, antepartum 11/20/2016   Substance abuse affecting pregnancy, antepartum (HCC) 11/20/2016   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   Smoker 09/30/2014   Maternal obesity, antepartum 09/30/2014    Past Surgical History:  Procedure Laterality Date   CESAREAN SECTION     ORIF ANKLE FRACTURE Left 02/02/2020   Procedure: OPEN REDUCTION INTERNAL FIXATION (ORIF) ANKLE FRACTURE;  Surgeon: Teryl Lucy, MD;  Location: [facility];  Service: Orthopedics;  Laterality: Left;    OB History     Gravida  2   Para  1   Term  1   Preterm      AB      Living  1      SAB      IAB      Ectopic      Multiple  0   Live Births  1            Home Medications    Prior to Admission medications   Medication Sig  Start Date End Date Taking? Authorizing Provider  amoxicillin (AMOXIL) 500 MG capsule Take 1 capsule (500 mg total) by mouth 2 (two) times daily for 10 days. 07/23/23 08/02/23 Yes Radford Pax, NP  lidocaine (XYLOCAINE) 2 % solution Use as directed 15 mLs in the mouth or throat every 6 (six) hours as needed (sore throat). Gargle and spit do not swallow 07/23/23  Yes Radford Pax, NP  acetaminophen (TYLENOL) 500 MG tablet Take 500 mg by mouth every 6 (six) hours as needed.    [provider]  albuterol (PROVENTIL HFA;VENTOLIN HFA) 108 (90 Base) MCG/ACT inhaler Inhale 2 puffs into the lungs every 6 (six) hours as needed for wheezing or shortness of breath. 01/03/18   Menshew, [name], PA-C  APPLE CIDER VINEGAR PO Take by mouth.    [provider]  diphenhydrAMINE-APAP, sleep, (TYLENOL PM EXTRA STRENGTH PO) Take by mouth.    [provider]  fluconazole (DIFLUCAN) 150 MG tablet Take 1 tablet (150 mg total) by mouth every 3 (three) days. 05/08/22   [name], FNP  ibuprofen (ADVIL) 800 MG tablet Take 1 tablet (800 mg total) by mouth 3 (three) times daily. 01/24/20   [name], PA-C  levonorgestrel (MIRENA) 20 MCG/24HR IUD by Intrauterine route. 10/03/18   [provider]  metroNIDAZOLE (FLAGYL) 500 MG tablet Take 1 tablet (500 mg total) by mouth 2 (two) times daily. 05/09/22   Lamptey, [name], MD  traMADol (ULTRAM) 50 MG tablet Take 50 mg by mouth 3 (three) times daily as needed. 03/18/20   [provider]  ferrous fumarate (HEMOCYTE - 106 MG FE) 325 (106 Fe) MG TABS tablet Take 1 tablet (106 mg of iron total) by mouth 2 (two) times daily. Take with juice; avoid dairy 30 min before and after taking pill. Patient not taking: Reported on 05/27/2018 03/13/18 05/07/19  [name], CNM    Family History Family History  Problem Relation Age of Onset   Hypertension Maternal Grandmother    Heart disease Maternal Grandmother      Social History Social History   Tobacco Use   Smoking status: Some Days    Current packs/day: 0.25    Types: Cigarettes   Smokeless tobacco: Former  Building services engineer status: Never Used  Substance Use Topics   Alcohol use: Yes    Comment: occasional    Drug use: No     Allergies   Shellfish allergy   Review of Systems Review of Systems  HENT:  Positive for congestion and sore throat.      Physical Exam Triage Vital Signs ED Triage Vitals  Encounter Vitals Group     BP 07/23/23 0824 120/73     Systolic BP Percentile --      Diastolic BP Percentile --      Pulse Rate 07/23/23 0824 (!) 107     Resp 07/23/23 0824 16     Temp 07/23/23 0824 98.4 F (36.9 C)     Temp Source 07/23/23 0824 Oral     SpO2 07/23/23 0824 96 %     Weight --      Height --      Head Circumference --      Peak Flow --      Pain Score 07/23/23 0823 8     Pain Loc --      Pain Education --      Exclude from Growth Chart --    No data found.  Updated Vital Signs BP 120/73 (BP Location: Right Arm)   Pulse (!) 107   Temp 98.4 F (36.9 C) (Oral)   Resp 16   LMP  (LMP Unknown)   SpO2 96%   Visual Acuity Right Eye Distance:   Left Eye Distance:   Bilateral Distance:    Right Eye Near:   Left Eye Near:    Bilateral Near:     Physical Exam Vitals and nursing note reviewed.  Constitutional:      General: She is not in acute distress.    Appearance: She is well-developed. She is not ill-appearing.  HENT:     Head: Normocephalic and atraumatic.     Right Ear: Tympanic membrane and ear canal normal.     Left Ear: Tympanic membrane and ear canal normal.     Nose: Congestion present.     Mouth/Throat:     Mouth: Mucous membranes are moist.     Pharynx: Oropharynx is clear. Uvula midline. Posterior oropharyngeal erythema present.     Tonsils: No tonsillar exudate or tonsillar abscesses. 3+ on the right. 3+ on the left.  Eyes:     Conjunctiva/sclera: Conjunctivae normal.      Pupils: Pupils are  equal, round, and reactive to light.  Cardiovascular:     Rate and Rhythm: Normal rate and regular rhythm.     Heart sounds: Normal heart sounds.  Pulmonary:     Effort: Pulmonary effort is normal.     Breath sounds: Normal breath sounds.  Musculoskeletal:     Cervical back: Normal range of motion and neck supple.  Lymphadenopathy:     Cervical: Cervical adenopathy present.  Skin:    General: Skin is warm and dry.  Neurological:     General: No focal deficit present.     Mental Status: [name], place, and time.  Psychiatric:        Mood and Affect: Mood normal.        Behavior: Behavior normal.      UC Treatments / Results  Labs (all labs ordered are listed, but only abnormal results are displayed) Labs Reviewed  POCT RAPID STREP A (OFFICE) - Abnormal; Notable for the following components:      Result Value   Rapid Strep A Screen Positive (*)    All other components within normal limits    EKG   Radiology No results found.  Procedures Procedures (including critical care time)  Medications Ordered in UC Medications - No data to display  Initial Impression / Assessment and Plan / UC Course  I have reviewed the triage vital signs and the nursing notes.  Pertinent labs & imaging results that were available during my care of the patient were reviewed by me and considered in my medical decision making (see chart for details).     Reviewed exam and symptoms with patient.  No red flags.  Positive rapid strep, start amoxicillin and lidocaine gargle.  PCP follow-up if symptoms do not improve.  ER precautions reviewed. Final Clinical Impressions(s) / UC Diagnoses   Final diagnoses:  Streptococcal sore throat     Discharge Instructions      Start amoxicillin twice daily for 10 days.  You may use lidocaine gargle as needed.  Gargle and spit do not swallow.  Continue over-the-counter Tylenol or ibuprofen as well as salt  water gargles and warm liquids.  Lots of rest and fluids.  Please follow-up with your PCP if your symptoms do not improve.  Please go to the ER for any worsening symptoms.  I hope you feel better soon!    ED Prescriptions     Medication Sig Dispense Auth. Provider   amoxicillin (AMOXIL) 500 MG capsule Take 1 capsule (500 mg total) by mouth 2 (two) times daily for 10 days. 20 capsule Radford Pax, NP   lidocaine (XYLOCAINE) 2 % solution Use as directed 15 mLs in the mouth or throat every 6 (six) hours as needed (sore throat). Gargle and spit do not swallow 100 mL Radford Pax, NP      PDMP not reviewed this encounter.   Radford Pax, NP 07/23/23 [phone]

## 2023-07-23 NOTE — Discharge Instructions (Signed)
Start amoxicillin twice daily for 10 days.  You may use lidocaine gargle as needed.  Gargle and spit do not swallow.  Continue over-the-counter Tylenol or ibuprofen as well as salt water gargles and warm liquids.  Lots of rest and fluids.  Please follow-up with your PCP if your symptoms do not improve.  Please go to the ER for any worsening symptoms.  I hope you feel better soon!

## 2023-07-23 NOTE — ED Triage Notes (Signed)
Pt presents with c/o sore throat and tonsil swelling. Has had trouble swallowing X 2 days.   Home interventions: tylenol and halls, green tea with ginger/honey, throat spray

## 2023-09-01 ENCOUNTER — Ambulatory Visit
Admission: EM | Admit: 2023-09-01 | Discharge: 2023-09-01 | Disposition: A | Discharge status: Home / Self Care | Payer: 59 | Attending: Family Medicine | Admitting: Family Medicine

## 2023-09-01 DIAGNOSIS — N760 Acute vaginitis: Secondary | ICD-10-CM

## 2023-09-01 DIAGNOSIS — B9689 Other specified bacterial agents as the cause of diseases classified elsewhere: Secondary | ICD-10-CM | POA: Diagnosis not present

## 2023-09-01 MED ORDER — METRONIDAZOLE 500 MG PO TABS: 500.0000 mg | ORAL_TABLET | Freq: Two times a day (BID) | ORAL | 0 refills | Status: AC

## 2023-09-01 MED ORDER — FLUCONAZOLE 150 MG PO TABS: 150.0000 mg | ORAL_TABLET | ORAL | 0 refills | Status: AC

## 2023-09-01 NOTE — ED Provider Notes (Signed)
[facility]  Note:  This document was prepared using Conservation officer, historic buildings and may include unintentional dictation errors.  MRN: [id number] DOB: Apr 06, 1996  Subjective:   Lori Short is a 27 y.o. female presenting for 2-week history of acute onset persistent recurring yellow vaginal discharge, vaginal itching and irritation.  No concern for pregnancy.  Would like an STI check too.  Has a history of BV and yeast infections.  No current facility-administered medications for this encounter.  Current Outpatient Medications:    acetaminophen (TYLENOL) 500 MG tablet, Take 500 mg by mouth every 6 (six) hours as needed., Disp: , Rfl:    albuterol (PROVENTIL HFA;VENTOLIN HFA) 108 (90 Base) MCG/ACT inhaler, Inhale 2 puffs into the lungs every 6 (six) hours as needed for wheezing or shortness of breath., Disp: 1 Inhaler, Rfl: 0   APPLE CIDER VINEGAR PO, Take by mouth., Disp: , Rfl:    diphenhydrAMINE-APAP, sleep, (TYLENOL PM EXTRA STRENGTH PO), Take by mouth., Disp: , Rfl:    fluconazole (DIFLUCAN) 150 MG tablet, Take 1 tablet (150 mg total) by mouth every 3 (three) days., Disp: 2 tablet, Rfl: 0   ibuprofen (ADVIL) 800 MG tablet, Take 1 tablet (800 mg total) by mouth 3 (three) times daily., Disp: 21 tablet, Rfl: 0   levonorgestrel (MIRENA) 20 MCG/24HR IUD, by Intrauterine route., Disp: , Rfl:    lidocaine (XYLOCAINE) 2 % solution, Use as directed 15 mLs in the mouth or throat every 6 (six) hours as needed (sore throat). Gargle and spit do not swallow, Disp: 100 mL, Rfl: 0   metroNIDAZOLE (FLAGYL) 500 MG tablet, Take 1 tablet (500 mg total) by mouth 2 (two) times daily., Disp: 14 tablet, Rfl: 0   traMADol (ULTRAM) 50 MG tablet, Take 50 mg by mouth 3 (three) times daily as needed., Disp: , Rfl:    Allergies  Allergen Reactions   Shellfish Allergy Hives, Nausea And Vomiting and Swelling    Reaction:  Tongue swelling    Past Medical History:  Diagnosis Date    Bronchitis    Headache      Past Surgical History:  Procedure Laterality Date   CESAREAN SECTION     ORIF ANKLE FRACTURE Left 02/02/2020   Procedure: OPEN REDUCTION INTERNAL FIXATION (ORIF) ANKLE FRACTURE;  Surgeon: Teryl Lucy, MD;  Location: [facility];  Service: Orthopedics;  Laterality: Left;    Family History  Problem Relation Age of Onset   Hypertension Maternal Grandmother    Heart disease Maternal Grandmother     Social History   Tobacco Use   Smoking status: Some Days    Current packs/day: 0.25    Types: Cigarettes   Smokeless tobacco: Former  Building services engineer status: Never Used  Substance Use Topics   Alcohol use: Yes    Comment: occasional    Drug use: No    ROS   Objective:   Vitals: BP 131/80 (BP Location: Left Arm)   Pulse 96   Temp 98.8 F (37.1 C) (Oral)   Resp 16   LMP 08/19/2023 (Approximate)   SpO2 98%   Physical Exam Constitutional:      General: She is not in acute distress.    Appearance: Normal appearance. She is well-developed. She is not ill-appearing, toxic-appearing or diaphoretic.  HENT:     Head: Normocephalic and atraumatic.     Nose: Nose normal.     Mouth/Throat:     Mouth: Mucous membranes are  moist.     Pharynx: Oropharynx is clear.  Eyes:     General: No scleral icterus.       Right eye: No discharge.        Left eye: No discharge.     Extraocular Movements: Extraocular movements intact.     Conjunctiva/sclera: Conjunctivae normal.  Cardiovascular:     Rate and Rhythm: Normal rate.  Pulmonary:     Effort: Pulmonary effort is normal.  Abdominal:     General: Bowel sounds are normal. There is no distension.     Palpations: Abdomen is soft. There is no mass.     Tenderness: There is no abdominal tenderness. There is no right CVA tenderness, left CVA tenderness, guarding or rebound.  Skin:    General: Skin is warm and dry.  Neurological:     General: No focal deficit present.     Mental  Status: [name], place, and time.  Psychiatric:        Mood and Affect: Mood normal.        Behavior: Behavior normal.        Thought Content: Thought content normal.        Judgment: Judgment normal.     Assessment and Plan :   PDMP not reviewed this encounter.  1. Bacterial vaginosis   2. Acute vaginitis    We will treat patient empirically for bacterial vaginosis with Flagyl and for yeast vaginitis with fluconazole.  Labs pending. Counseled patient on potential for adverse effects with medications prescribed/recommended today, ER and return-to-clinic precautions discussed, patient verbalized understanding.    Wallis Bamberg, [place] 09/01/23 [phone]

## 2023-09-01 NOTE — ED Triage Notes (Signed)
Pt reports yellow vaginal discharge,itching and discomfort x 2 weeks. OTC yeast infection treatment gives no relief.

## 2023-09-02 LAB — CERVICOVAGINAL ANCILLARY ONLY
Bacterial Vaginitis (gardnerella): POSITIVE — AB
Candida Glabrata: NEGATIVE
Candida Vaginitis: NEGATIVE
Chlamydia: NEGATIVE
Comment: NEGATIVE
Comment: NEGATIVE
Comment: NEGATIVE
Comment: NEGATIVE
Comment: NEGATIVE
Comment: NORMAL
Neisseria Gonorrhea: NEGATIVE
Trichomonas: POSITIVE — AB

## 2024-01-24 ENCOUNTER — Ambulatory Visit
Admission: EM | Admit: 2024-01-24 | Discharge: 2024-01-24 | Disposition: A | Discharge status: Home / Self Care | Attending: Family Medicine | Admitting: Family Medicine

## 2024-01-24 DIAGNOSIS — M778 Other enthesopathies, not elsewhere classified: Secondary | ICD-10-CM | POA: Diagnosis not present

## 2024-01-24 MED ORDER — NAPROXEN 375 MG PO TABS: 375.0000 mg | ORAL_TABLET | Freq: Two times a day (BID) | ORAL | 0 refills | Status: AC | PRN

## 2024-01-24 NOTE — ED Triage Notes (Signed)
 Patient present at [facility] with c/o lump on lt wrist. Has been hurting for three weeks and has not been able to move it.

## 2024-01-24 NOTE — Discharge Instructions (Addendum)
 Use the Ace wrap to the wrist to help with swelling and support of the joint.  Start naproxen twice daily for 7 days.  You may elevate and ice as needed.  Please follow-up with your PCP in 2 to 3 days for recheck.  Please go to the ER for any worsening symptoms.  Hope you feel better soon!

## 2024-01-24 NOTE — ED Provider Notes (Signed)
 [facility]    CSN: [id number] Arrival date & time: 01/24/24  0809      History   Chief Complaint Chief Complaint  Patient presents with   Wrist Pain    HPI Lori Short is a 28 y.o. female presents for evaluation of wrist pain.  Patient reports 3 weeks of a intermittent left wrist pain with some swelling.  Denies any known injury but does states she has been working out more and also does a lot of lifting at work.  Denies any numbness/tingling/weakness of the area.  No history of injuries or surgeries to the area.  She has not used any OTC treatments for symptoms since onset.  No other concerns at this time.   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 06/04/2018   Pyelectasis of fetus on prenatal ultrasound 05/01/2018   Anemia affecting pregnancy in first trimester 03/15/2018   Supervision of other normal pregnancy, antepartum 03/12/2018   History of gestational hypertension 03/18/2017   Proteinuria affecting pregnancy in third trimester 03/18/2017   Edema during pregnancy in third trimester 03/18/2017   Rubella non-immune status, antepartum 11/20/2016   Substance abuse affecting pregnancy, antepartum (HCC) 11/20/2016   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   Smoker 09/30/2014   Maternal obesity, antepartum 09/30/2014    Past Surgical History:  Procedure Laterality Date   CESAREAN SECTION     ORIF ANKLE FRACTURE Left 02/02/2020   Procedure: OPEN REDUCTION INTERNAL FIXATION (ORIF) ANKLE FRACTURE;  Surgeon: Osa Blase, MD;  Location: [facility];  Service: Orthopedics;  Laterality: Left;    OB History     Gravida  2   Para  1   Term  1   Preterm      AB      Living  1      SAB      IAB      Ectopic      Multiple  0   Live Births  1            Home  Medications    Prior to Admission medications   Medication Sig Start Date End Date Taking? Authorizing Provider  naproxen (NAPROSYN) 375 MG tablet Take 1 tablet (375 mg total) by mouth 2 (two) times daily as needed for up to 7 days. 01/24/24 01/31/24 Yes Azile Minardi, Jodi R, NP  acetaminophen  (TYLENOL ) 500 MG tablet Take 500 mg by mouth every 6 (six) hours as needed.    [provider]  albuterol  (PROVENTIL  HFA;VENTOLIN  HFA) 108 (90 Base) MCG/ACT inhaler Inhale 2 puffs into the lungs every 6 (six) hours as needed for wheezing or shortness of breath. 01/03/18   Menshew, Raye Cai, PA-C  APPLE CIDER VINEGAR PO Take by mouth.    [provider]  diphenhydrAMINE -APAP, sleep, (TYLENOL  PM EXTRA STRENGTH PO) Take by mouth.    [provider]  fluconazole  (DIFLUCAN ) 150 MG tablet Take 1 tablet (150 mg total) by mouth once a week. 09/01/23   Adolph Hoop, PA-C  ibuprofen  (ADVIL ) 800 MG tablet Take 1 tablet (800 mg total) by mouth 3 (three) times daily. 01/24/20   Coretha Dew, PA-C  levonorgestrel (MIRENA) 20 MCG/24HR IUD by Intrauterine route. 10/03/18   [provider]  lidocaine  (XYLOCAINE ) 2 % solution Use as  directed 15 mLs in the mouth or throat every 6 (six) hours as needed (sore throat). Gargle and spit do not swallow 07/23/23   Antionetta Ator, Jodi R, NP  metroNIDAZOLE  (FLAGYL ) 500 MG tablet Take 1 tablet (500 mg total) by mouth 2 (two) times daily with a meal. DO NOT CONSUME ALCOHOL WHILE TAKING THIS MEDICATION. 09/01/23   Adolph Hoop, PA-C  traMADol (ULTRAM) 50 MG tablet Take 50 mg by mouth 3 (three) times daily as needed. 03/18/20   [provider]  ferrous fumarate  (HEMOCYTE - 106 MG FE) 325 (106 Fe) MG TABS tablet Take 1 tablet (106 mg of iron total) by mouth 2 (two) times daily. Take with juice; avoid dairy 30 min before and after taking pill. Patient not taking: Reported on 05/27/2018 03/13/18 05/07/19  Kooistra, Kathryn Lorraine, CNM    Family History Family History   Problem Relation Age of Onset   Hypertension Maternal Grandmother    Heart disease Maternal Grandmother     Social History Social History   Tobacco Use   Smoking status: Some Days    Current packs/day: 0.25    Types: Cigarettes   Smokeless tobacco: Former  Building services engineer status: Never Used  Substance Use Topics   Alcohol use: Yes    Comment: occasional    Drug use: No     Allergies   Shellfish allergy   Review of Systems Review of Systems  Musculoskeletal:        Left wrist pain      Physical Exam Triage Vital Signs ED Triage Vitals  Encounter Vitals Group     BP 01/24/24 0824 125/82     Systolic BP Percentile --      Diastolic BP Percentile --      Pulse Rate 01/24/24 0824 88     Resp 01/24/24 0824 16     Temp 01/24/24 0824 98.8 F (37.1 C)     Temp Source 01/24/24 0824 Oral     SpO2 01/24/24 0824 96 %     Weight --      Height --      Head Circumference --      Peak Flow --      Pain Score 01/24/24 0823 4     Pain Loc --      Pain Education --      Exclude from Growth Chart --    No data found.  Updated Vital Signs BP 125/82 (BP Location: Right Arm)   Pulse 88   Temp 98.8 F (37.1 C) (Oral)   Resp 16   LMP 01/18/2024 (Exact Date)   SpO2 96%   Visual Acuity Right Eye Distance:   Left Eye Distance:   Bilateral Distance:    Right Eye Near:   Left Eye Near:    Bilateral Near:     Physical Exam Vitals and nursing note reviewed.  Constitutional:      General: She is not in acute distress.    Appearance: Normal appearance. She is not ill-appearing.  HENT:     Head: Normocephalic and atraumatic.  Eyes:     Pupils: Pupils are equal, round, and reactive to light.  Cardiovascular:     Rate and Rhythm: Normal rate.  Pulmonary:     Effort: Pulmonary effort is normal.  Musculoskeletal:     Left wrist: Swelling and tenderness present. No deformity, effusion, lacerations, bony tenderness, snuff box tenderness or crepitus. Normal range  of motion. Normal pulse.  Arms:     Comments: There is mild soft tissue swelling without erythema or warmth to the mid dorsum of the left wrist.  There is no induration or fluctuance.  Mildly tender to palpation.  No palpable cyst.  Skin:    General: Skin is warm and dry.  Neurological:     General: No focal deficit present.     Mental Status: [name], place, and time.  Psychiatric:        Mood and Affect: Mood normal.        Behavior: Behavior normal.      UC Treatments / Results  Labs (all labs ordered are listed, but only abnormal results are displayed) Labs Reviewed - No data to display  Basic metabolic panel Order: [id number]  Status: Final result     Next appt: None   Test Result Released: Yes (not seen)   0 Result Notes          Component Ref Range & Units (hover) 7 mo ago (06/03/23) 1 yr ago (11/10/22) 5 yr ago (03/12/18) 6 yr ago (01/22/18) 6 yr ago (01/03/18) 6 yr ago (12/21/17) 6 yr ago (09/21/17)  Sodium 135 133 Low  135 R 137 135 140 137  Potassium 3.6 3.9 4.4 R 4.5 3.7 4.1 4.3  Chloride 103 101 103 R 106 R 105 R 107 R 104 R  CO2 23 24 20  R 23 24 25 26   Glucose, Bld 96 102 High  CM 74 R 94 R 98 R 98 R 107 High  R  Comment: Glucose reference range applies only to samples taken after fasting for at least 8 hours.  BUN 12 8 5  Low  7 8 12 11   Creatinine, Ser 0.76 0.98 0.63 R 0.67 0.82 0.81 0.77  Calcium 9.5 8.8 Low  9.2 R 9.3 8.8 Low  9.2 9.0  GFR, Estimated >60 >60 CM       Comment: (NOTE) Calculated using the CKD-EPI Creatinine Equation (2021)  Anion gap 9 8 CM  8 CM 6 CM 8 CM 7  Comment: Performed at [facility], [address], [place], [place] [address]  Resulting Agency [facility] CLIN LAB [facility] LAB [facility] LAB [facility] CLIN LAB        Specimen Collected: 06/03/23 10:27 Last Resulted: 06/03/23 11:44    EKG   Radiology No results found.  Procedures Procedures (including  critical care time)  Medications Ordered in UC Medications - No data to display  Initial Impression / Assessment and Plan / UC Course  I have reviewed the triage vital signs and the nursing notes.  Pertinent labs & imaging results that were available during my care of the patient were reviewed by me and considered in my medical decision making (see chart for details).     Reviewed exam and symptoms with patient.  No red flags.  Discussed likely tendinitis/soft tissue swelling.  No palpable ganglion cyst on exam.  Will treat with RICE therapy/anti-inflammatories.  Advised PCP follow-up at her scheduled appointment next week.  ER precautions reviewed and patient verbalized understanding. Final Clinical Impressions(s) / UC Diagnoses   Final diagnoses:  Left wrist tendonitis     Discharge Instructions      Use the Ace wrap to the wrist to help with swelling and support of the joint.  Start naproxen twice daily for 7 days.  You may elevate and ice as needed.  Please follow-up with your PCP in 2 to 3 days for recheck.  Please go to the ER for any worsening symptoms.  Hope you feel better soon!   ED Prescriptions     Medication Sig Dispense Auth. Provider   naproxen (NAPROSYN) 375 MG tablet Take 1 tablet (375 mg total) by mouth 2 (two) times daily as needed for up to 7 days. 14 tablet Virjean Boman, Jodi R, NP      PDMP not reviewed this encounter.   Alleen Arbour, NP 01/24/24 [phone]

## 2024-02-10 ENCOUNTER — Encounter (HOSPITAL_COMMUNITY): Payer: Self-pay | Admitting: Emergency Medicine

## 2024-02-10 ENCOUNTER — Emergency Department (HOSPITAL_COMMUNITY)
Admission: EM | Admit: 2024-02-10 | Discharge: 2024-02-10 | Disposition: A | Discharge status: Home / Self Care | Attending: Emergency Medicine | Admitting: Emergency Medicine

## 2024-02-10 DIAGNOSIS — R42 Dizziness and giddiness: Secondary | ICD-10-CM

## 2024-02-10 DIAGNOSIS — M67432 Ganglion, left wrist: Secondary | ICD-10-CM | POA: Diagnosis not present

## 2024-02-10 LAB — CBC WITH DIFFERENTIAL/PLATELET
Abs Immature Granulocytes: 0.02 10*3/uL (ref 0.00–0.07)
Basophils Absolute: 0 10*3/uL (ref 0.0–0.1)
Basophils Relative: 0 %
Eosinophils Absolute: 0.4 10*3/uL (ref 0.0–0.5)
Eosinophils Relative: 4 %
HCT: 38.2 % (ref 36.0–46.0)
Hemoglobin: 11.2 g/dL — ABNORMAL LOW (ref 12.0–15.0)
Immature Granulocytes: 0 %
Lymphocytes Relative: 37 %
Lymphs Abs: 3.1 10*3/uL (ref 0.7–4.0)
MCH: 22.1 pg — ABNORMAL LOW (ref 26.0–34.0)
MCHC: 29.3 g/dL — ABNORMAL LOW (ref 30.0–36.0)
MCV: 75.3 fL — ABNORMAL LOW (ref 80.0–100.0)
Monocytes Absolute: 0.5 10*3/uL (ref 0.1–1.0)
Monocytes Relative: 7 %
Neutro Abs: 4.2 10*3/uL (ref 1.7–7.7)
Neutrophils Relative %: 52 %
Platelets: 475 10*3/uL — ABNORMAL HIGH (ref 150–400)
RBC: 5.07 MIL/uL (ref 3.87–5.11)
RDW: 16.1 % — ABNORMAL HIGH (ref 11.5–15.5)
WBC: 8.2 10*3/uL (ref 4.0–10.5)
nRBC: 0 % (ref 0.0–0.2)

## 2024-02-10 LAB — COMPREHENSIVE METABOLIC PANEL WITH GFR
ALT: 31 U/L (ref 0–44)
AST: 26 U/L (ref 15–41)
Albumin: 3.8 g/dL (ref 3.5–5.0)
Alkaline Phosphatase: 61 U/L (ref 38–126)
Anion gap: 7 (ref 5–15)
BUN: 14 mg/dL (ref 6–20)
CO2: 24 mmol/L (ref 22–32)
Calcium: 9.1 mg/dL (ref 8.9–10.3)
Chloride: 103 mmol/L (ref 98–111)
Creatinine, Ser: 0.78 mg/dL (ref 0.44–1.00)
GFR, Estimated: >60 mL/min (ref >60)
Glucose, Bld: 97 mg/dL (ref 70–99)
Potassium: 3.9 mmol/L (ref 3.5–5.1)
Sodium: 134 mmol/L — ABNORMAL LOW (ref 135–145)
Total Bilirubin: 0.5 mg/dL (ref 0.0–1.2)
Total Protein: 8.1 g/dL (ref 6.5–8.1)

## 2024-02-10 LAB — HCG, SERUM, QUALITATIVE: Preg, Serum: NEGATIVE

## 2024-02-10 LAB — CBG MONITORING, ED: Glucose-Capillary: 109 mg/dL — ABNORMAL HIGH (ref 70–99)

## 2024-02-10 MED ORDER — SODIUM CHLORIDE 0.9 % IV BOLUS
1000.0000 mL | Freq: Once | INTRAVENOUS | Status: AC
  Administered 2024-02-10: 1000 mL via INTRAVENOUS

## 2024-02-10 NOTE — ED Provider Notes (Signed)
 [place] [facility]PARTMENT AT [facility] Provider Note   CSN: [id number] Arrival date & time: 02/10/24  0341     History  Chief Complaint  Patient presents with   Dizziness    Lori Short is a 28 y.o. female.  Presents to the emergency department for evaluation of dizziness.  Reports that she has been experiencing severe lightheadedness over the last 1 to 2 days.  Standing up or sitting up seems to make symptoms worse.  Denies vertigo symptoms, however.  No headache.  At times feels like this when she is at work, works third shift.  She has not been working this weekend and symptoms are worse than usual.  No syncope.  Patient also concerned about painful lump on her left wrist.  No injury.       Home Medications Prior to Admission medications   Medication Sig Start Date End Date Taking? Authorizing Provider  acetaminophen  (TYLENOL ) 500 MG tablet Take 500 mg by mouth every 6 (six) hours as needed.   Yes [provider]  albuterol  (PROVENTIL  HFA;VENTOLIN  HFA) 108 (90 Base) MCG/ACT inhaler Inhale 2 puffs into the lungs every 6 (six) hours as needed for wheezing or shortness of breath. 01/03/18  Yes Menshew, Raye Cai, PA-C  ibuprofen  (ADVIL ) 800 MG tablet Take 1 tablet (800 mg total) by mouth 3 (three) times daily. 01/24/20  Yes Coretha Dew, PA-C  levonorgestrel (MIRENA) 20 MCG/24HR IUD by Intrauterine route. 10/03/18  Yes [provider]  APPLE CIDER VINEGAR PO Take by mouth. Patient not taking: Reported on 02/10/2024    [provider]  diphenhydrAMINE -APAP, sleep, (TYLENOL  PM EXTRA STRENGTH PO) Take by mouth. Patient not taking: Reported on 02/10/2024    [provider]  fluconazole  (DIFLUCAN ) 150 MG tablet Take 1 tablet (150 mg total) by mouth once a week. Patient not taking: Reported on 02/10/2024 09/01/23   Adolph Hoop, PA-C  lidocaine  (XYLOCAINE ) 2 % solution Use as directed 15 mLs in the mouth or throat every 6 (six)  hours as needed (sore throat). Gargle and spit do not swallow Patient not taking: Reported on 02/10/2024 07/23/23   Mayer, Jodi R, NP  metroNIDAZOLE  (FLAGYL ) 500 MG tablet Take 1 tablet (500 mg total) by mouth 2 (two) times daily with a meal. DO NOT CONSUME ALCOHOL WHILE TAKING THIS MEDICATION. Patient not taking: Reported on 02/10/2024 09/01/23   Adolph Hoop, PA-C  traMADol (ULTRAM) 50 MG tablet Take 50 mg by mouth 3 (three) times daily as needed. Patient not taking: Reported on 02/10/2024 03/18/20   [provider]  ferrous fumarate  (HEMOCYTE - 106 MG FE) 325 (106 Fe) MG TABS tablet Take 1 tablet (106 mg of iron total) by mouth 2 (two) times daily. Take with juice; avoid dairy 30 min before and after taking pill. Patient not taking: Reported on 05/27/2018 03/13/18 05/07/19  Kooistra, Kathryn Lorraine, CNM      Allergies    Shellfish allergy    Review of Systems   Review of Systems  Physical Exam Updated Vital Signs BP (!) 186/96   Pulse 92   Temp 98 F (36.7 C)   Resp (!) 22   LMP 01/18/2024 (Exact Date)   SpO2 100%  Physical Exam Vitals and nursing note reviewed.  Constitutional:      General: She is not in acute distress.    Appearance: She is well-developed.  HENT:     Head: Normocephalic and atraumatic.     Mouth/Throat:  Mouth: Mucous membranes are moist.  Eyes:     General: Vision grossly intact. Gaze aligned appropriately.     Extraocular Movements: Extraocular movements intact.     Conjunctiva/sclera: Conjunctivae normal.  Cardiovascular:     Rate and Rhythm: Normal rate and regular rhythm.     Pulses: Normal pulses.     Heart sounds: Normal heart sounds, S1 normal and S2 normal. No murmur heard.    No friction rub. No gallop.  Pulmonary:     Effort: Pulmonary effort is normal. No respiratory distress.     Breath sounds: Normal breath sounds.  Abdominal:     General: Bowel sounds are normal.     Palpations: Abdomen is soft.     Tenderness: There is no  abdominal tenderness. There is no guarding or rebound.     Hernia: No hernia is present.  Musculoskeletal:        General: No swelling.     Left wrist: Tenderness (Ganglion cyst, dorsum) present.     Cervical back: Full passive range of motion without pain, normal range of motion and neck supple. No spinous process tenderness or muscular tenderness. Normal range of motion.     Right lower leg: No edema.     Left lower leg: No edema.  Skin:    General: Skin is warm and dry.     Capillary Refill: Capillary refill takes less than 2 seconds.     Findings: No ecchymosis, erythema, rash or wound.  Neurological:     General: No focal deficit present.     Mental Status: [name], place, and time.     GCS: GCS eye subscore is 4. GCS verbal subscore is 5. GCS motor subscore is 6.     Cranial Nerves: Cranial nerves 2-12 are intact.     Sensory: Sensation is intact.     Motor: Motor function is intact.     Coordination: Coordination is intact.  Psychiatric:        Attention and Perception: Attention normal.        Mood and Affect: Mood normal.        Speech: Speech normal.        Behavior: Behavior normal.     ED Results / Procedures / Treatments   Labs (all labs ordered are listed, but only abnormal results are displayed) Labs Reviewed  CBC WITH DIFFERENTIAL/PLATELET - Abnormal; Notable for the following components:      Result Value   Hemoglobin 11.2 (*)    MCV 75.3 (*)    MCH 22.1 (*)    MCHC 29.3 (*)    RDW 16.1 (*)    Platelets 475 (*)    All other components within normal limits  COMPREHENSIVE METABOLIC PANEL WITH GFR - Abnormal; Notable for the following components:   Sodium 134 (*)    All other components within normal limits  CBG MONITORING, ED - Abnormal; Notable for the following components:   Glucose-Capillary 109 (*)    All other components within normal limits  HCG, SERUM, QUALITATIVE  URINALYSIS, ROUTINE W REFLEX MICROSCOPIC    EKG EKG  Interpretation Date/Time:  Monday Feb 10 2024 06:33:48 EDT Ventricular Rate:  88 PR Interval:  174 QRS Duration:  89 QT Interval:  348 QTC Calculation: 421 R Axis:   54  Text Interpretation: Sinus rhythm Low voltage, precordial leads Abnormal Q suggests anterior infarct Confirmed by Ballard Bongo [phone]) on 02/10/2024 7:41:24 AM  Radiology No  results found.  Procedures Procedures    Medications Ordered in ED Medications  sodium chloride  0.9 % bolus 1,000 mL (0 mLs Intravenous Stopped 02/10/24 0741)    ED Course/ Medical Decision Making/ A&P                                 Medical Decision Making Amount and/or Complexity of Data Reviewed Labs: ordered.   Patient presents to the emergency department with episodes of dizziness.  She reports that this has happened in the past but over the last 1 to 2 days symptoms have been worse.  No syncope.  Patient does not have any focal findings on exam.  Patient mildly hypertensive here, not hypotensive.  No fever.  Lab work has been performed, no acute abnormalities noted.  She was hydrated and has done well.  Patient complaining of a lump on the back of her left wrist.  This is consistent with small ganglion cyst.  No acute treatment needed, can follow-up with hand surgery on-call.        Final Clinical Impression(s) / ED Diagnoses Final diagnoses:  Dizziness  Ganglion cyst of dorsum of left wrist    Rx / DC Orders ED Discharge Orders     None         Ballard Bongo, MD 02/10/24 [phone]

## 2024-02-10 NOTE — ED Triage Notes (Signed)
 PT works 3rd shift. Reports episodes of dizziness/ lightheadedness. States head will feel heavy- not spinning. Cyst on left wrist. CBG 109. Neuro intact

## 2024-09-02 DIAGNOSIS — Z30432 Encounter for removal of intrauterine contraceptive device: Secondary | ICD-10-CM | POA: Diagnosis not present

## 2024-09-02 DIAGNOSIS — Z124 Encounter for screening for malignant neoplasm of cervix: Secondary | ICD-10-CM | POA: Diagnosis not present
